# Patient Record
Sex: Female | Born: 1965 | Race: White | Hispanic: No | Marital: Married | Smoking: Never smoker
Health system: Southern US, Community
[De-identification: ages and names within clinical notes are randomized; demographics above are authoritative.]

## PROBLEM LIST (undated history)

## (undated) DIAGNOSIS — R0989 Other specified symptoms and signs involving the circulatory and respiratory systems: Secondary | ICD-10-CM

## (undated) DIAGNOSIS — T8859XA Other complications of anesthesia, initial encounter: Secondary | ICD-10-CM

## (undated) DIAGNOSIS — M719 Bursopathy, unspecified: Secondary | ICD-10-CM

## (undated) DIAGNOSIS — E039 Hypothyroidism, unspecified: Secondary | ICD-10-CM

## (undated) DIAGNOSIS — R6889 Other general symptoms and signs: Secondary | ICD-10-CM

## (undated) DIAGNOSIS — K219 Gastro-esophageal reflux disease without esophagitis: Secondary | ICD-10-CM

## (undated) DIAGNOSIS — I3139 Other pericardial effusion (noninflammatory): Secondary | ICD-10-CM

## (undated) DIAGNOSIS — I313 Pericardial effusion (noninflammatory): Secondary | ICD-10-CM

## (undated) DIAGNOSIS — Z9889 Other specified postprocedural states: Secondary | ICD-10-CM

## (undated) DIAGNOSIS — Z973 Presence of spectacles and contact lenses: Secondary | ICD-10-CM

## (undated) DIAGNOSIS — N9489 Other specified conditions associated with female genital organs and menstrual cycle: Secondary | ICD-10-CM

## (undated) DIAGNOSIS — K631 Perforation of intestine (nontraumatic): Secondary | ICD-10-CM

## (undated) DIAGNOSIS — Z8709 Personal history of other diseases of the respiratory system: Secondary | ICD-10-CM

## (undated) DIAGNOSIS — N390 Urinary tract infection, site not specified: Secondary | ICD-10-CM

## (undated) DIAGNOSIS — R030 Elevated blood-pressure reading, without diagnosis of hypertension: Secondary | ICD-10-CM

## (undated) DIAGNOSIS — R112 Nausea with vomiting, unspecified: Secondary | ICD-10-CM

## (undated) DIAGNOSIS — E063 Autoimmune thyroiditis: Secondary | ICD-10-CM

## (undated) HISTORY — PX: GROIN DEBRIDEMENT: SHX5159

## (undated) HISTORY — DX: Other specified symptoms and signs involving the circulatory and respiratory systems: R09.89

## (undated) HISTORY — DX: Gastro-esophageal reflux disease without esophagitis: K21.9

## (undated) HISTORY — DX: Other general symptoms and signs: R68.89

## (undated) HISTORY — DX: Urinary tract infection, site not specified: N39.0

## (undated) HISTORY — DX: Hypothyroidism, unspecified: E03.9

## (undated) HISTORY — DX: Elevated blood-pressure reading, without diagnosis of hypertension: R03.0

## (undated) HISTORY — DX: Personal history of other diseases of the respiratory system: Z87.09

---

## 1997-04-13 HISTORY — PX: GROIN DEBRIDEMENT: SHX5159

## 1997-04-13 HISTORY — PX: CYST EXCISION: SHX5701

## 1998-01-08 ENCOUNTER — Ambulatory Visit (HOSPITAL_BASED_OUTPATIENT_CLINIC_OR_DEPARTMENT_OTHER): Admission: RE | Admit: 1998-01-08 | Discharge: 1998-01-08 | Payer: Self-pay | Admitting: General Surgery

## 2004-01-09 ENCOUNTER — Ambulatory Visit (HOSPITAL_COMMUNITY): Admission: RE | Admit: 2004-01-09 | Discharge: 2004-01-09 | Payer: Self-pay | Admitting: Family Medicine

## 2004-12-06 ENCOUNTER — Inpatient Hospital Stay (HOSPITAL_COMMUNITY): Admission: RE | Admit: 2004-12-06 | Discharge: 2004-12-08 | Payer: Self-pay | Admitting: Psychiatry

## 2004-12-06 ENCOUNTER — Ambulatory Visit: Payer: Self-pay | Admitting: Psychiatry

## 2005-05-13 ENCOUNTER — Other Ambulatory Visit: Admission: RE | Admit: 2005-05-13 | Discharge: 2005-05-13 | Payer: Self-pay | Admitting: Obstetrics and Gynecology

## 2006-07-29 ENCOUNTER — Ambulatory Visit (HOSPITAL_COMMUNITY): Admission: RE | Admit: 2006-07-29 | Discharge: 2006-07-29 | Payer: Self-pay | Admitting: Obstetrics and Gynecology

## 2006-08-04 ENCOUNTER — Encounter: Admission: RE | Admit: 2006-08-04 | Discharge: 2006-08-04 | Payer: Self-pay | Admitting: Obstetrics and Gynecology

## 2007-03-01 ENCOUNTER — Encounter: Admission: RE | Admit: 2007-03-01 | Discharge: 2007-03-01 | Payer: Self-pay | Admitting: Endocrinology

## 2007-09-01 ENCOUNTER — Ambulatory Visit (HOSPITAL_COMMUNITY): Admission: RE | Admit: 2007-09-01 | Discharge: 2007-09-01 | Payer: Self-pay | Admitting: Family Medicine

## 2010-05-04 ENCOUNTER — Encounter: Payer: Self-pay | Admitting: Obstetrics and Gynecology

## 2010-05-04 ENCOUNTER — Encounter: Payer: Self-pay | Admitting: Family Medicine

## 2010-08-29 NOTE — Discharge Summary (Signed)
NAMELINDA, Forbes                 ACCOUNT NO.:  [id number]   MEDICAL RECORD NO.:  [id number]          PATIENT TYPE:  IPS   LOCATION:  0503                          FACILITY:  [facility]   PHYSICIAN:  Anselm Jungling, MD  DATE OF BIRTH:  1966-03-24   DATE OF ADMISSION:  12/06/2004  DATE OF DISCHARGE:  12/08/2004                                 DISCHARGE SUMMARY   IDENTIFYING DATA AND REASON FOR ADMISSION:  This was the first [facility] admission  for Melinda Forbes, a 46 year old married woman who was admitted due to crisis,  depression that appeared to be related to various stressors and losses.  She  had no prior history of depression or psychiatric treatment.  Please refer  to the admission note for further details pertaining to the symptoms,  circumstances and history that lead to her hospitalization at [facility].   MEDICAL AND LABORATORY:  There were no significant medical issues during  this brief inpatient psychiatric stay.  Admission chemistry panel and CBC  were within normal limits.  A toxicology screen was negative.  Urinalysis  was within normal limits.   HOSPITAL COURSE:  The patient was admitted on December 06, 2004, very late in  the day and was discharged in less than 48 hours.  She was admitted to the  adult inpatient service.  In her first meeting with the undersigned she  indicated that being in the inpatient service had been good for her sake, in  terms of resting.  She denied suicidal ideation of any kind whatsoever.  She recounted to me in detail the various stresses and losses that had lead  to her current crisis.  She indicated that she felt that she was well enough  and stable enough for discharge on that day.  She wished to continue in  outpatient treatment with her usual therapist, Clifton Custard, whom had visited her  while on the unit.  She indicated she was not interested in our intensive  outpatient program.  Also, the patient indicated that she was not ready to  agree to a trial of  antidepressant medication.   The undersigned also met with the patient and her husband briefly.  Prior to  discharge, the patient had an opportunity for a family meeting with herself,  her husband and the unit counselor.  This was a productive meeting.  Her  husband expressed no particular safety concerns following the patient's  discharge.   AFTERCARE:  The patient was to follow up with Dr. Karmen Bongo on December 09, 2004 at 3:00 p.m.  She was to continue on her usual Synthroid 88 mcg  daily which she takes for hypothyroidism, and Ambien 10 mg p.r.n. at h.s. as  needed for insomnia.   DISCHARGE DIAGNOSES:  AXIS I:  Adjustment disorder with depressed mood.  AXIS II:  Deferred.  AXIS III:  History of hypothyroidism.  AXIS IV:  Stressors severe.  AXIS V:  Global assessment of function on discharge 70.           ______________________________  Anselm Jungling, MD  Electronically Signed  SPB/MEDQ  D:  12/30/2004  T:  12/31/2004  Job:  [id number]

## 2010-08-29 NOTE — H&P (Signed)
Melinda Forbes, Melinda Forbes NO.:  [id number]   MEDICAL RECORD NO.:  [id number]          PATIENT TYPE:  IPS   LOCATION:  0503                          FACILITY:  [facility]   PHYSICIAN:  Jeanice Lim, M.D. DATE OF BIRTH:  1965/07/11   DATE OF ADMISSION:  12/06/2004  DATE OF DISCHARGE:                         PSYCHIATRIC ADMISSION ASSESSMENT   IDENTIFYING INFORMATION:  This is a 45 year old married white female.  She  apparently presented here for admission last evening, basically because of  overwhelming stress.  She stated I just feel overwhelmed with fear, anxiety  and grief.  Her father died a week ago.  They buried him a week ago  yesterday, Saturday.  Her husband resigned from [employer] where he was a  Education officer, environmental.  He went into [facility] due to his addiction to pain  medications.  He is now in outpatient treatment there.  He blames her for  his substance abuse.  He wants the patient out of the house.  There has been  ongoing conflict with her husband over his substance abuse.  The patient  states the only man who really loved me is gone, meaning this was her  father.  She states I don't want to live.  I just want to go to sleep and  never wake up.  She says I do not feel like I would do anything to hurt  myself but I would not stop anyone who was going to do it for me.  She  states that she did not realize how bad she would feel when her father  actually died.  Apparently he had a very rare bone marrow illness.  It was a  scarring illness of the bone marrow, so although his death was expected it  still hit her very, very hard.   PAST PSYCHIATRIC HISTORY:  She has no inpatient history.  She has been going  to outpatient family counseling, Karmen Bongo, for several years now.   SOCIAL HISTORY:  She has a B.S.  She is employed as an Airline pilot.  She and  her husband have 3 children, a son who is 1, and two daughters 58, and 85.  They have been married 21 years.   The oldest daughter just left a week ago  for college.   FAMILY HISTORY:  Her biological mother is bipolar.   ALCOHOL AND DRUG ABUSE:  None.   PAST MEDICAL HISTORY:  Primary care Jahquez Steffler is Dr. Merri Brunette.  Medical  problems:  She is hypothyroid and treated.  Medications:  Synthroid 0.075 mg  q.o. day and the other days 0.088.   ALLERGIES:  No known drug allergies.   POSITIVE PHYSICAL FINDINGS:  PHYSICAL EXAMINATION:  She is somewhat thin,  otherwise her physical examination is within normal limits.  Her vital signs  on admission:  Height is 63-3/4 inches.  She weighs 101 pounds.  Her  temperature is 98.6, blood pressure is 110/71 to 123/71.  Pulse range is 71  to 82, and her respirations are 16.  Her labs are not back yet.   MENTAL STATUS  EXAM:  She is alert and oriented x3.  She is appropriately  groomed, dressed, and appears adequately nourished albeit thin.  Her speech  has a normal rate, rhythm and tone.  Her mood is labile.  She cries quite  easily.  Her affect reflects pain and grief.  Thought process is clear,  rational and goal oriented.  She states she just can no longer stay in this  situation at home.  Cognitively, judgment and insight are good,  concentration and memory are good.  Her intelligence is at least average.  She denies suicidal or homicidal ideation.  She denies auditory or visual  hallucinations.   ADMISSION DIAGNOSES:  AXIS I:  Major depressive disorder, single, severe, no  psychosis.  AXIS II:  Deferred.  AXIS III:  Hypothyroidism.  AXIS IV:  Severe, marital and grief, problems with primary support group,  economic issues, and other psychosocial problems, her husband's addiction.   PLAN:  The plan is to admit for safety and stabilization, to start an  antidepressant, anti-anxiety agent.  The patient declines beginning that at  this time, and we can assess her for the IOP.  We will set up a family  session with her husband today to make sure  returning home is a safe option.      Mickie Leonarda Salon, P.A.-C.      Jeanice Lim, M.D.  Electronically Signed    MD/MEDQ  D:  12/07/2004  T:  12/07/2004  Job:  [id number]

## 2010-09-16 ENCOUNTER — Other Ambulatory Visit (HOSPITAL_COMMUNITY): Payer: Self-pay | Admitting: Family Medicine

## 2010-09-16 DIAGNOSIS — Z1231 Encounter for screening mammogram for malignant neoplasm of breast: Secondary | ICD-10-CM

## 2010-10-02 ENCOUNTER — Ambulatory Visit (HOSPITAL_COMMUNITY)
Admission: RE | Admit: 2010-10-02 | Discharge: 2010-10-02 | Disposition: A | Discharge status: Home / Self Care | Payer: BC Managed Care – PPO | Source: Ambulatory Visit | Attending: Family Medicine | Admitting: Family Medicine

## 2010-10-02 DIAGNOSIS — Z1231 Encounter for screening mammogram for malignant neoplasm of breast: Secondary | ICD-10-CM

## 2010-11-19 ENCOUNTER — Other Ambulatory Visit: Payer: Self-pay | Admitting: Family Medicine

## 2010-11-19 ENCOUNTER — Other Ambulatory Visit (HOSPITAL_COMMUNITY)
Admission: RE | Admit: 2010-11-19 | Discharge: 2010-11-19 | Disposition: A | Discharge status: Home / Self Care | Payer: BC Managed Care – PPO | Source: Ambulatory Visit | Attending: Family Medicine | Admitting: Family Medicine

## 2010-11-19 DIAGNOSIS — Z0000 Encounter for general adult medical examination without abnormal findings: Secondary | ICD-10-CM | POA: Insufficient documentation

## 2011-12-22 ENCOUNTER — Other Ambulatory Visit (HOSPITAL_COMMUNITY): Payer: Self-pay | Admitting: Family Medicine

## 2011-12-22 DIAGNOSIS — Z1231 Encounter for screening mammogram for malignant neoplasm of breast: Secondary | ICD-10-CM

## 2011-12-30 ENCOUNTER — Ambulatory Visit (HOSPITAL_COMMUNITY)
Admission: RE | Admit: 2011-12-30 | Discharge: 2011-12-30 | Disposition: A | Discharge status: Home / Self Care | Payer: BC Managed Care – PPO | Source: Ambulatory Visit | Attending: Family Medicine | Admitting: Family Medicine

## 2011-12-30 DIAGNOSIS — Z1231 Encounter for screening mammogram for malignant neoplasm of breast: Secondary | ICD-10-CM | POA: Insufficient documentation

## 2012-01-06 ENCOUNTER — Ambulatory Visit (INDEPENDENT_AMBULATORY_CARE_PROVIDER_SITE_OTHER): Payer: BC Managed Care – PPO | Admitting: Family Medicine

## 2012-01-06 ENCOUNTER — Encounter: Payer: Self-pay | Admitting: Family Medicine

## 2012-01-06 VITALS — BP 132/82 | HR 65 | Temp 98.8°F | Ht 63.5 in | Wt 112.0 lb

## 2012-01-06 DIAGNOSIS — E039 Hypothyroidism, unspecified: Secondary | ICD-10-CM | POA: Insufficient documentation

## 2012-01-06 DIAGNOSIS — K219 Gastro-esophageal reflux disease without esophagitis: Secondary | ICD-10-CM

## 2012-01-06 DIAGNOSIS — Z87440 Personal history of urinary (tract) infections: Secondary | ICD-10-CM

## 2012-01-06 NOTE — Assessment & Plan Note (Signed)
Problem stable.  Continue current medications and diet appropriate for this condition.  We have reviewed our general long term plan for this problem and also reviewed symptoms and signs that should prompt the patient to call or return to the office.  

## 2012-01-06 NOTE — Assessment & Plan Note (Signed)
Managed by endocrinologist (Dr. Talmage Nap).

## 2012-01-06 NOTE — Progress Notes (Signed)
Office Note 01/06/2012  CC:  Chief Complaint  Patient presents with  . Establish Care    chronic UTI, no symptoms today; has appt with urology set up    HPI:  Melinda Forbes is a 46 y.o. White female who is here to establish care. Patient's most recent primary MD: Deboraha Sprang at [facility] (Dr. Katrinka Blazing). Old records were not reviewed prior to or during today's visit.  Latest problem is recurrent bladder infections.  Just finished abx.  Currently asymptomatic. LMP 01/2007.  Mammogram last week-normal.  Repeat in 1 yr..  Last pap was 11/2010--normal.  Has never had an abnormal pap smear.   Past Medical History  Diagnosis Date  . Hypothyroidism     managed by endocrinologist (Dr. Talmage Nap)  . GERD (gastroesophageal reflux disease)     omeprazole daily helps  . Recurrent UTI     no hx of pyelonephritis    Past Surgical History  Procedure Date  . Cesarean section 1988    first pregnancy.  Her second delivery was vaginal.    Family History  Problem Relation Age of Onset  . Diabetes Mother   . Heart disease Father     first MI age 39  . Hypertension Father   . Cancer Maternal Grandmother     died of breast cancer age 60    History   Social History  . Marital Status: Divorced    Spouse Name: N/A    Number of Children: N/A  . Years of Education: N/A   Occupational History  . Not on file.   Social History Main Topics  . Smoking status: Never Smoker   . Smokeless tobacco: Never Used  . Alcohol Use: No  . Drug Use: No  . Sexually Active: Not on file   Other Topics Concern  . Not on file   Social History Narrative   Divorced, engaged.  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  Bachelor's degree from [place] [employer].  She is originally from [place].No T/A/Ds.Exercise: walks a lot (1-2 times per week).She tries to eat healthy.    Outpatient Encounter Prescriptions as of 01/06/2012  Medication Sig Dispense Refill  . omeprazole (PRILOSEC) 20 MG capsule Take 1  capsule by mouth daily.      Marland Kitchen SYNTHROID 88 MCG tablet Take 1 tablet by mouth daily.        Allergies  Allergen Reactions  . Other     NARCOTICS--PROJECTILE VOMITING    ROS Review of Systems  Constitutional: Negative for fever and fatigue.  HENT: Negative for congestion and sore throat.   Eyes: Negative for visual disturbance.  Respiratory: Negative for cough.   Cardiovascular: Negative for chest pain.  Gastrointestinal: Negative for nausea and abdominal pain.  Genitourinary: Negative for dysuria, frequency, hematuria, flank pain, vaginal bleeding and vaginal discharge.  Musculoskeletal: Negative for back pain and joint swelling.  Skin: Negative for rash.  Neurological: Negative for weakness and headaches.  Hematological: Negative for adenopathy.    PE; Blood pressure 132/82, pulse 65, temperature 98.8 F (37.1 C), temperature source Temporal, height 5' 3.5" (1.613 m), weight 112 lb (50.803 kg), SpO2 98.00%. Gen: Alert, well appearing.  Patient is oriented to person, place, time, and situation. ENT:  Eyes: no injection, icteris, swelling, or exudate.  EOMI, PERRLA. Nose: no drainage or turbinate edema/swelling.  No injection or focal lesion.  Mouth: lips without lesion/swelling.  Oral mucosa pink and moist.  Dentition intact and without obvious caries or gingival swelling.  Oropharynx without erythema, exudate, or swelling.  Neck - No masses or thyromegaly or limitation in range of motion CV: RRR, no m/r/g.   LUNGS: CTA bilat, nonlabored resps, good aeration in all lung fields. EXT: no clubbing, cyanosis, or edema.   Pertinent labs:  none  ASSESSMENT AND PLAN:   New pt: obtain old records.  History of recurrent UTIs Currently asymptomatic. She has initial consult with urologist, Dr. Brunilda Payor, on 03/03/12.  Hypothyroidism Managed by endocrinologist (Dr. Talmage Nap).  GERD (gastroesophageal reflux disease) Problem stable.  Continue current medications and diet appropriate for  this condition.  We have reviewed our general long term plan for this problem and also reviewed symptoms and signs that should prompt the patient to call or return to the office.   She'll make appt for CPE/pelvic at her convenience since her last one was 11/2010. Otherwise, return prn.

## 2012-01-06 NOTE — Assessment & Plan Note (Signed)
Currently asymptomatic. She has initial consult with urologist, Dr. Brunilda Payor, on 03/03/12.

## 2012-03-11 ENCOUNTER — Encounter: Payer: Self-pay | Admitting: Family Medicine

## 2012-06-17 ENCOUNTER — Ambulatory Visit: Payer: BC Managed Care – PPO | Admitting: Family Medicine

## 2012-06-24 ENCOUNTER — Ambulatory Visit (INDEPENDENT_AMBULATORY_CARE_PROVIDER_SITE_OTHER): Payer: BC Managed Care – PPO | Admitting: Family Medicine

## 2012-06-24 ENCOUNTER — Encounter: Payer: Self-pay | Admitting: Family Medicine

## 2012-06-24 VITALS — BP 106/64 | HR 56 | Temp 99.0°F | Ht 63.5 in | Wt 116.0 lb

## 2012-06-24 DIAGNOSIS — K219 Gastro-esophageal reflux disease without esophagitis: Secondary | ICD-10-CM

## 2012-06-24 MED ORDER — ESOMEPRAZOLE MAGNESIUM 40 MG PO CPDR: 40.0000 mg | DELAYED_RELEASE_CAPSULE | Freq: Every day | ORAL | Status: DC

## 2012-06-24 MED ORDER — RANITIDINE HCL 300 MG PO TABS: 300.0000 mg | ORAL_TABLET | Freq: Every day | ORAL | Status: DC

## 2012-06-24 NOTE — Progress Notes (Signed)
OFFICE NOTE  06/24/2012  CC:  Chief Complaint  Patient presents with  . tonsil issue    intermittent swelling, soreness     HPI: Patient is a 47 y.o. Caucasian female who is here for questions about her throat.  She recounts years of symmetric "tonsillar" swelling, white stuff collects in "tonsils", throat hurts most of the time.  She feels some PND and admits to some GERD sx's.  GER better since on omeprazole but still problematic.   She has not elevated the head of her bed.  She had a nasal spray at one time but didn't like it. Denies nasal congestion.  No fevers.  No URI sx's or cough.   She does clear her throat a lot.  Eating makes her have more mucous in throat and makes her feel like she has to cough up stuff, but she denies dysphagia or odynophagia.   She had food allergy testing in the past year or so and these were all negative per her report. See PMH for past ENT info.  ROS: as above, plus no abd pain, no ear aches, no rashes, no SOB or DOE, no voice hoarseness.  Pertinent PMH:  Past Medical History  Diagnosis Date  . Hypothyroidism     managed by endocrinologist (Dr. Talmage Nap)  . GERD (gastroesophageal reflux disease)     omeprazole daily helps  . Recurrent UTI     no hx of pyelonephritis  . Chronic throat clearing     ENT eval 2008 by Dr. Ezzard Standing showed normal vocal cords and " slight edema of the arytenoid mucosa possibly indicative of globus type sx's and possible reflux symptoms".   Past Surgical History  Procedure Laterality Date  . Cesarean section  1988    first pregnancy.  Her second delivery was vaginal.  . Groin debridement    . Cyst excision  1999    Epidermal inclusion cyst in right groin (Dr. Johna Sheriff)    MEDS: **Of note, she takes the omprazole 20mg  bid, not qd. Outpatient Prescriptions Prior to Visit  Medication Sig Dispense Refill  . omeprazole (PRILOSEC) 20 MG capsule Take 1 capsule by mouth daily.      Marland Kitchen SYNTHROID 88 MCG tablet Take 1 tablet by  mouth daily.       No facility-administered medications prior to visit.  Cranberry pill daily to prevent bladder infections alleve occasionally  PE: Blood pressure 106/64, pulse 56, temperature 99 F (37.2 C), temperature source Temporal, height 5' 3.5" (1.613 m), weight 116 lb (52.617 kg). Gen: Alert, well appearing.  Patient is oriented to person, place, time, and situation. ENT: Ears: EACs clear, normal epithelium.  TMs with good light reflex and landmarks bilaterally.  Eyes: no injection, icteris, swelling, or exudate.  EOMI, PERRLA. Nose: no drainage or turbinate edema/swelling.  No injection or focal lesion.  Mouth: lips without lesion/swelling.  Oral mucosa pink and moist.  Dentition intact and without obvious caries or gingival swelling.   Oropharynx exam shows no visible tonsillar tissue on the right, minimal visible tonsillar tissue on the left.  No erythema of the tonsillar pillars or post pharyng wall.  No PND noted.  She has mild erythema of the soft palate.  No exudate, no swelling.   Neck - No masses or thyromegaly or limitation in range of motion CV: RRR, no m/r/g.   LUNGS: CTA bilat, nonlabored resps, good aeration in all lung fields.   IMPRESSION AND PLAN:  GERD (gastroesophageal reflux disease) I think this is  the root of all of her current symptoms. I will change her to nexium 40mg  qAM and will add 300 mg zantac at bedtime. Recommended she elevate the head of her bed with a 2 X 4 or brick, continue to try to follow a restrictive diet regarding GER. F/u 1 mo and if no better then will refer back to her ENT for further eval/mgmt--she may need esoph manometry/pH probe and may potentially be a candidate for Nissen fundoplication.   An After Visit Summary was printed and given to the patient.  FOLLOW UP: 1 mo

## 2012-06-24 NOTE — Assessment & Plan Note (Addendum)
I think this is the root of all of her current symptoms. I will change her to nexium 40mg  qAM and will add 300 mg zantac at bedtime. Recommended she elevate the head of her bed with a 2 X 4 or brick, continue to try to follow a restrictive diet regarding GER. F/u 1 mo and if no better then will refer back to her ENT for further eval/mgmt--she may need esoph manometry/pH probe and may potentially be a candidate for Nissen fundoplication.

## 2012-07-22 ENCOUNTER — Encounter: Payer: Self-pay | Admitting: Family Medicine

## 2012-07-22 ENCOUNTER — Ambulatory Visit (INDEPENDENT_AMBULATORY_CARE_PROVIDER_SITE_OTHER): Payer: BC Managed Care – PPO | Admitting: Family Medicine

## 2012-07-22 VITALS — BP 92/60 | HR 59 | Temp 98.8°F | Ht 63.5 in | Wt 117.5 lb

## 2012-07-22 DIAGNOSIS — K219 Gastro-esophageal reflux disease without esophagitis: Secondary | ICD-10-CM

## 2012-07-22 NOTE — Assessment & Plan Note (Signed)
Moderately well controlled with prilosec 20mg  bid. She still needs to try adding the zantac 300 mg qhs like I recommended last visit. I encouraged her to have f/u visit with her ENT for possible consideration of re-do laryngoscopy and/or 24h pH probe testing.

## 2012-07-22 NOTE — Progress Notes (Signed)
OFFICE NOTE  07/22/2012  CC:  Chief Complaint  Patient presents with  . Follow-up    Genella Rife     HPI: Patient is a 47 y.o. Caucasian female who is here for 1 mo f/u GERD.   Nexium x 10d was of no help so she switched back to prilosec bid.  She has not done the zantac at night b/c she wasn't sure if it was ok to mix with the prilosec.  Pertinent PMH:  Past Medical History  Diagnosis Date  . Hypothyroidism     managed by endocrinologist (Dr. Talmage Nap)  . GERD (gastroesophageal reflux disease)     omeprazole daily helps  . Recurrent UTI     no hx of pyelonephritis  . Chronic throat clearing     ENT eval 2008 by Dr. Ezzard Standing showed normal vocal cords and " slight edema of the arytenoid mucosa possibly indicative of globus type sx's and possible reflux symptoms".    MEDS:  Outpatient Prescriptions Prior to Visit  Medication Sig Dispense Refill  . SYNTHROID 88 MCG tablet Take 1 tablet by mouth daily.      Marland Kitchen esomeprazole (NEXIUM) 40 MG capsule Take 1 capsule (40 mg total) by mouth daily before breakfast.  30 capsule  6  . ranitidine (ZANTAC) 300 MG tablet Take 1 tablet (300 mg total) by mouth at bedtime.  30 tablet  6   No facility-administered medications prior to visit.    PE: Blood pressure 92/60, pulse 59, temperature 98.8 F (37.1 C), temperature source Oral, height 5' 3.5" (1.613 m), weight 117 lb 8 oz (53.298 kg), SpO2 98.00%. Gen: Alert, well appearing.  Patient is oriented to person, place, time, and situation. AFFECT: pleasant.  Displays lucid thought and speech. No further exam today.  IMPRESSION AND PLAN:  GERD (gastroesophageal reflux disease) Moderately well controlled with prilosec 20mg  bid. She still needs to try adding the zantac 300 mg qhs like I recommended last visit. I encouraged her to have f/u visit with her ENT for possible consideration of re-do laryngoscopy and/or 24h pH probe testing.    An After Visit Summary was printed and given to the  patient.   FOLLOW UP: prn

## 2012-09-21 ENCOUNTER — Other Ambulatory Visit: Payer: Self-pay | Admitting: *Deleted

## 2012-09-21 MED ORDER — OMEPRAZOLE 20 MG PO CPDR: 20.0000 mg | DELAYED_RELEASE_CAPSULE | Freq: Two times a day (BID) | ORAL | Status: DC

## 2012-09-21 NOTE — Telephone Encounter (Signed)
Allergist test showed no allergies, but placed on Omeprazole for acid reflux; needs refill to [facility] request to pharmacy.

## 2012-10-24 ENCOUNTER — Telehealth: Payer: Self-pay | Admitting: Emergency Medicine

## 2012-10-24 DIAGNOSIS — R6889 Other general symptoms and signs: Secondary | ICD-10-CM

## 2012-10-24 NOTE — Telephone Encounter (Signed)
Patient states she is still having issues since her 06-24-12 visit and would like a referral to [facility]. Please advise patient as she really does not want to make another appointment here since nothing was found.

## 2012-10-24 NOTE — Telephone Encounter (Signed)
Referral to ENT done per Dr. Milinda Cave.

## 2012-12-15 ENCOUNTER — Other Ambulatory Visit: Payer: Self-pay | Admitting: Family Medicine

## 2012-12-15 DIAGNOSIS — Z1231 Encounter for screening mammogram for malignant neoplasm of breast: Secondary | ICD-10-CM

## 2013-01-11 ENCOUNTER — Other Ambulatory Visit: Payer: Self-pay | Admitting: Family Medicine

## 2013-01-11 MED ORDER — OMEPRAZOLE 20 MG PO CPDR: 20.0000 mg | DELAYED_RELEASE_CAPSULE | Freq: Two times a day (BID) | ORAL | Status: DC

## 2013-01-20 ENCOUNTER — Ambulatory Visit (HOSPITAL_COMMUNITY)
Admission: RE | Admit: 2013-01-20 | Discharge: 2013-01-20 | Disposition: A | Discharge status: Home / Self Care | Payer: BC Managed Care – PPO | Source: Ambulatory Visit | Attending: Family Medicine | Admitting: Family Medicine

## 2013-01-20 DIAGNOSIS — Z1231 Encounter for screening mammogram for malignant neoplasm of breast: Secondary | ICD-10-CM

## 2013-02-13 ENCOUNTER — Other Ambulatory Visit: Payer: Self-pay | Admitting: Family Medicine

## 2013-02-13 MED ORDER — OMEPRAZOLE 20 MG PO CPDR: 20.0000 mg | DELAYED_RELEASE_CAPSULE | Freq: Two times a day (BID) | ORAL | Status: DC

## 2013-04-14 ENCOUNTER — Telehealth: Payer: Self-pay | Admitting: Family Medicine

## 2013-04-14 MED ORDER — CIPROFLOXACIN HCL 500 MG PO TABS: 500.0000 mg | ORAL_TABLET | Freq: Two times a day (BID) | ORAL | Status: DC

## 2013-04-14 NOTE — Telephone Encounter (Signed)
Please advise 

## 2013-04-14 NOTE — Telephone Encounter (Signed)
Cipro 500mg  sent to pharmacy per Dr. Milinda CaveMcGowen.

## 2013-04-14 NOTE — Addendum Note (Signed)
Addended by: Eulah PontALBRIGHT, Yvonda Fouty M on: 04/14/2013 04:34 PM   Modules accepted: Orders

## 2013-04-14 NOTE — Telephone Encounter (Signed)
Patient is having burning, pain, and frequency along with blood in her urine. She is not near an urgent care. She can [facility] an Rx [facility] on [address] It's at the corner [address]

## 2013-04-14 NOTE — Telephone Encounter (Signed)
Patient advised to go to an urgent care because we would not be able to send in antibiotics without seeing patient in office.  Patient was not very happy about this but I tried to explain that an urgent care would be her best bet because they would be able to test her urine and make sure she was on the correct antibiotic but then the phone was disconnected or she hung up, unsure of which happened.

## 2013-04-14 NOTE — Telephone Encounter (Signed)
Patient is out of town on a ski trip and she is experiencing burning with urination and frequency, she wants to see if something [facility]

## 2013-04-18 ENCOUNTER — Ambulatory Visit: Payer: BC Managed Care – PPO | Admitting: Family Medicine

## 2013-07-03 ENCOUNTER — Other Ambulatory Visit: Payer: Self-pay | Admitting: Family Medicine

## 2013-07-20 ENCOUNTER — Other Ambulatory Visit: Payer: Self-pay | Admitting: Family Medicine

## 2013-07-20 ENCOUNTER — Other Ambulatory Visit (HOSPITAL_COMMUNITY)
Admission: RE | Admit: 2013-07-20 | Discharge: 2013-07-20 | Disposition: A | Discharge status: Home / Self Care | Payer: BC Managed Care – PPO | Source: Ambulatory Visit | Attending: Family Medicine | Admitting: Family Medicine

## 2013-07-20 DIAGNOSIS — Z124 Encounter for screening for malignant neoplasm of cervix: Secondary | ICD-10-CM | POA: Insufficient documentation

## 2013-07-21 ENCOUNTER — Other Ambulatory Visit: Payer: Self-pay | Admitting: Family Medicine

## 2013-08-04 ENCOUNTER — Other Ambulatory Visit: Payer: Self-pay | Admitting: Family Medicine

## 2013-10-09 ENCOUNTER — Other Ambulatory Visit: Payer: Self-pay | Admitting: Family Medicine

## 2013-10-30 ENCOUNTER — Other Ambulatory Visit: Payer: Self-pay | Admitting: Family Medicine

## 2014-01-09 ENCOUNTER — Other Ambulatory Visit (HOSPITAL_COMMUNITY): Payer: Self-pay | Admitting: Family Medicine

## 2014-01-09 DIAGNOSIS — Z1231 Encounter for screening mammogram for malignant neoplasm of breast: Secondary | ICD-10-CM

## 2014-01-26 ENCOUNTER — Ambulatory Visit (HOSPITAL_COMMUNITY): Payer: BC Managed Care – PPO

## 2014-02-09 ENCOUNTER — Ambulatory Visit (HOSPITAL_COMMUNITY)
Admission: RE | Admit: 2014-02-09 | Discharge: 2014-02-09 | Disposition: A | Discharge status: Home / Self Care | Payer: BC Managed Care – PPO | Source: Ambulatory Visit | Attending: Family Medicine | Admitting: Family Medicine

## 2014-02-09 DIAGNOSIS — Z1231 Encounter for screening mammogram for malignant neoplasm of breast: Secondary | ICD-10-CM | POA: Insufficient documentation

## 2014-11-02 ENCOUNTER — Other Ambulatory Visit: Payer: Self-pay | Admitting: Orthopaedic Surgery

## 2014-11-02 DIAGNOSIS — M25552 Pain in left hip: Secondary | ICD-10-CM

## 2014-11-15 ENCOUNTER — Other Ambulatory Visit: Payer: Self-pay

## 2015-01-14 ENCOUNTER — Other Ambulatory Visit: Payer: Self-pay

## 2015-01-14 DIAGNOSIS — Z1231 Encounter for screening mammogram for malignant neoplasm of breast: Secondary | ICD-10-CM

## 2015-02-13 ENCOUNTER — Ambulatory Visit
Admission: RE | Admit: 2015-02-13 | Discharge: 2015-02-13 | Disposition: A | Discharge status: Home / Self Care | Payer: Managed Care, Other (non HMO) | Source: Ambulatory Visit

## 2015-02-13 DIAGNOSIS — Z1231 Encounter for screening mammogram for malignant neoplasm of breast: Secondary | ICD-10-CM

## 2016-02-17 ENCOUNTER — Other Ambulatory Visit: Payer: Self-pay | Admitting: Family Medicine

## 2016-02-17 DIAGNOSIS — Z1231 Encounter for screening mammogram for malignant neoplasm of breast: Secondary | ICD-10-CM

## 2016-03-13 ENCOUNTER — Ambulatory Visit
Admission: RE | Admit: 2016-03-13 | Discharge: 2016-03-13 | Disposition: A | Discharge status: Home / Self Care | Payer: Managed Care, Other (non HMO) | Source: Ambulatory Visit | Attending: Family Medicine | Admitting: Family Medicine

## 2016-03-13 DIAGNOSIS — Z1231 Encounter for screening mammogram for malignant neoplasm of breast: Secondary | ICD-10-CM

## 2016-03-20 ENCOUNTER — Ambulatory Visit: Payer: Managed Care, Other (non HMO)

## 2016-07-16 ENCOUNTER — Other Ambulatory Visit (HOSPITAL_COMMUNITY)
Admission: RE | Admit: 2016-07-16 | Discharge: 2016-07-16 | Disposition: A | Discharge status: Home / Self Care | Payer: Managed Care, Other (non HMO) | Source: Ambulatory Visit | Attending: Physician Assistant | Admitting: Physician Assistant

## 2016-07-16 ENCOUNTER — Other Ambulatory Visit: Payer: Self-pay | Admitting: Physician Assistant

## 2016-07-16 DIAGNOSIS — Z1151 Encounter for screening for human papillomavirus (HPV): Secondary | ICD-10-CM | POA: Diagnosis not present

## 2016-07-16 DIAGNOSIS — Z01419 Encounter for gynecological examination (general) (routine) without abnormal findings: Secondary | ICD-10-CM | POA: Diagnosis present

## 2016-07-21 LAB — CYTOLOGY - PAP
Diagnosis: NEGATIVE
HPV: NOT DETECTED

## 2017-02-23 ENCOUNTER — Other Ambulatory Visit: Payer: Self-pay | Admitting: Family Medicine

## 2017-02-23 DIAGNOSIS — Z1231 Encounter for screening mammogram for malignant neoplasm of breast: Secondary | ICD-10-CM

## 2017-03-25 ENCOUNTER — Ambulatory Visit
Admission: RE | Admit: 2017-03-25 | Discharge: 2017-03-25 | Disposition: A | Discharge status: Home / Self Care | Payer: Managed Care, Other (non HMO) | Source: Ambulatory Visit | Attending: Family Medicine | Admitting: Family Medicine

## 2017-03-25 DIAGNOSIS — Z1231 Encounter for screening mammogram for malignant neoplasm of breast: Secondary | ICD-10-CM

## 2018-01-06 ENCOUNTER — Other Ambulatory Visit: Payer: Self-pay | Admitting: Family Medicine

## 2018-01-06 DIAGNOSIS — Z1231 Encounter for screening mammogram for malignant neoplasm of breast: Secondary | ICD-10-CM

## 2018-03-28 ENCOUNTER — Ambulatory Visit
Admission: RE | Admit: 2018-03-28 | Discharge: 2018-03-28 | Disposition: A | Discharge status: Home / Self Care | Payer: Managed Care, Other (non HMO) | Source: Ambulatory Visit | Attending: Family Medicine | Admitting: Family Medicine

## 2018-03-28 DIAGNOSIS — Z1231 Encounter for screening mammogram for malignant neoplasm of breast: Secondary | ICD-10-CM

## 2018-10-27 ENCOUNTER — Other Ambulatory Visit: Payer: Self-pay

## 2018-10-27 ENCOUNTER — Encounter: Payer: Self-pay | Admitting: Orthopaedic Surgery

## 2018-10-27 ENCOUNTER — Ambulatory Visit (INDEPENDENT_AMBULATORY_CARE_PROVIDER_SITE_OTHER): Payer: Managed Care, Other (non HMO) | Admitting: Orthopaedic Surgery

## 2018-10-27 ENCOUNTER — Ambulatory Visit (INDEPENDENT_AMBULATORY_CARE_PROVIDER_SITE_OTHER): Payer: Managed Care, Other (non HMO)

## 2018-10-27 VITALS — BP 121/75 | HR 56 | Ht 63.0 in | Wt 120.0 lb

## 2018-10-27 DIAGNOSIS — M25552 Pain in left hip: Secondary | ICD-10-CM | POA: Insufficient documentation

## 2018-10-27 MED ORDER — DIAZEPAM 5 MG PO TABS: 5.0000 mg | ORAL_TABLET | Freq: Once | ORAL | 0 refills | Status: DC | PRN

## 2018-10-27 NOTE — Progress Notes (Signed)
Office Visit Note   Patient: Melinda Forbes           Date of Birth: 07/10/1965           MRN: [id number] Visit Date: 10/27/2018              Requested by: Merri BrunetteSmith, Candace, MD [phone] [address] [place],  [place] [id number] PCP: Merri BrunetteSmith, Candace, MD   Assessment & Plan: Visit Diagnoses:  1. Pain in left hip     Plan: Pain directly over the greater trochanter of the left hip without any related back or buttock symptoms.  No referred pain, numbness or tingling in the left lower extremity.  Films of her pelvis and 6, specifically, left hip were negative.  Will obtain MRI scan to rule out trochanteric issues, bursitis or even glutei tear and will need Valium for sedation Follow-Up Instructions: Return After MRI scan left hip.   Orders:  Orders Placed This Encounter  Procedures  . XR HIP UNILAT W OR W/O PELVIS 2-3 VIEWS LEFT  . MR Hip Left w/o contrast   Meds ordered this encounter  Medications  . diazepam (VALIUM) 5 MG tablet    Sig: Take 1 tablet (5 mg total) by mouth once as needed for up to 1 dose for anxiety. Take 2 hours prior to MRI    Dispense:  1 tablet    Refill:  0    Order Specific Question:   Supervising Provider    Answer:   Valeria BatmanWHITFIELD, Melissa Tomaselli W [[id number]]      Procedures: No procedures performed   Clinical Data: No additional findings.   Subjective: Chief Complaint  Patient presents with  . Left Hip - Pain  Patient presents today for left hip pain. No known injury. Patient states that it hurts almost constantly. Pivoting on that side causes more pain. She takes Aleve at night. She said that as a child she could dislocate her hip and reduce it on her own. Seen several years ago for similar problem with negative films.  Patient has had a sensation of her "left hip coming out of place" for years.  Has difficulty sleeping on that side.  Absolutely no pain in her lumbar spine or buttock.  No numbness or tingling.  No right hip issues.  No groin pain.  No  anterior thigh pain.  No knee pain.  Has difficulty with certain positions causing localized tenderness over the greater trochanter left hip.  Does not seem to have a problem with straight line activities  HPI  Review of Systems   Objective: Vital Signs: BP 121/75   Pulse (!) 56   Ht 5\' 3"  (1.6 m)   Wt 120 lb (54.4 kg)   BMI 21.26 kg/m   Physical Exam Constitutional:      Appearance: She is well-developed.  Eyes:     Pupils: Pupils are equal, round, and reactive to light.  Pulmonary:     Effort: Pulmonary effort is normal.  Skin:    General: Skin is warm and dry.  Neurological:     Mental Status: She is alert and oriented to person, place, and time.  Psychiatric:        Behavior: Behavior normal.     Ortho Exam awake alert and oriented x3.  Comfortable sitting.  There is some tenderness directly over the greater trochanter of the left hip.  No crepitation or grinding.  No pain or loss of motion in the left groin relative to  the hip.  No thigh pain.  No knee pain.  Straight leg raise negative.  No percussible tenderness of lumbar spine, sacrum or buttock.  Neurologically intact.  Specialty Comments:  No specialty comments available.  Imaging: Xr Hip Unilat W Or W/o Pelvis 2-3 Views Left  Result Date: 10/27/2018 AP pelvis and lateral left hip are obtained.  There is no abnormality about the left hip.  Femoral head is nice and round the joint space is well-maintained and symmetrical to the right.  No ectopic calcification.  Sacroiliac joints appear to be intact.  Some degenerative change at the pubic symphysis.  No obvious abnormality about the greater trochanter where the patient is experiencing pain    PMFS History: Patient Active Problem List   Diagnosis Date Noted  . Pain in left hip 10/27/2018  . History of recurrent UTIs 01/06/2012  . Hypothyroidism   . GERD (gastroesophageal reflux disease)    Past Medical History:  Diagnosis Date  . Chronic throat clearing     ENT eval 2008 by Dr. Lucia Gaskins showed normal vocal cords and " slight edema of the arytenoid mucosa possibly indicative of globus type sx's and possible reflux symptoms".  . GERD (gastroesophageal reflux disease)    omeprazole daily helps  . Hypothyroidism    managed by endocrinologist (Dr. Chalmers Cater)  . Recurrent UTI    no hx of pyelonephritis    Family History  Problem Relation Age of Onset  . Diabetes Mother   . Breast cancer Mother   . Heart disease Father        first MI age 71  . Hypertension Father   . Cancer Father        "cancer of the bone marrow"  . Cancer Maternal Grandmother        died of breast cancer age 43  . Breast cancer Maternal Grandmother     Past Surgical History:  Procedure Laterality Date  . [place]   first pregnancy.  Her second delivery was vaginal.  . CYST EXCISION  1999   Epidermal inclusion cyst in right groin (Dr. Excell Seltzer)  . GROIN DEBRIDEMENT     Social History   Occupational History  . Not on file  Tobacco Use  . Smoking status: Never Smoker  . Smokeless tobacco: Never Used  Substance and Sexual Activity  . Alcohol use: No  . Drug use: No  . Sexual activity: Not on file

## 2018-10-28 ENCOUNTER — Telehealth: Payer: Self-pay | Admitting: Orthopaedic Surgery

## 2018-10-28 NOTE — Telephone Encounter (Signed)
Do you mind faxing this order? Im not sure exactly how to get the order printed.

## 2018-10-28 NOTE — Telephone Encounter (Signed)
Received call from [facility] with [employer] needing order for MRI of left hip faxed over to [place]. The fax# is [phone]   The number to contact Bronson Ing is [phone]

## 2018-10-28 NOTE — Telephone Encounter (Signed)
Faxed

## 2018-11-24 ENCOUNTER — Other Ambulatory Visit: Payer: Self-pay

## 2018-11-24 ENCOUNTER — Encounter: Payer: Self-pay | Admitting: Orthopaedic Surgery

## 2018-11-24 ENCOUNTER — Ambulatory Visit (INDEPENDENT_AMBULATORY_CARE_PROVIDER_SITE_OTHER): Payer: Managed Care, Other (non HMO) | Admitting: Orthopaedic Surgery

## 2018-11-24 VITALS — BP 119/71 | HR 49 | Ht 63.0 in | Wt 120.0 lb

## 2018-11-24 DIAGNOSIS — M25552 Pain in left hip: Secondary | ICD-10-CM | POA: Diagnosis not present

## 2018-11-24 MED ORDER — METHYLPREDNISOLONE ACETATE 40 MG/ML IJ SUSP
80.0000 mg | INTRAMUSCULAR | Status: AC | PRN
  Administered 2018-11-24: 16:00:00 80 mg via INTRA_ARTICULAR

## 2018-11-24 MED ORDER — BUPIVACAINE HCL 0.5 % IJ SOLN
2.0000 mL | INTRAMUSCULAR | Status: AC | PRN
  Administered 2018-11-24: 2 mL via INTRA_ARTICULAR

## 2018-11-24 MED ORDER — LIDOCAINE HCL 1 % IJ SOLN
2.0000 mL | INTRAMUSCULAR | Status: AC | PRN
  Administered 2018-11-24: 2 mL

## 2018-11-24 NOTE — Progress Notes (Signed)
Office Visit Note   Patient: Melinda Forbes           Date of Birth: 1965/10/06           MRN: [id number] Visit Date: 11/24/2018              Requested by: Carol Ada, [place],  [place] [address] PCP: Carol Ada, MD   Assessment & Plan: Visit Diagnoses:  1. 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  [name] is been experiencing pain along the superior aspect of the greater trochanter.  There is a reasonable chance that her pain may be referred from her hip and she may be a good candidate for an intra-articular hip injection.  First, I would like to try cortisone injection over the area of point tenderness at the tip of the trochanter.  This was performed and she thought she felt better.  If this continues to work we could always reinject it over time if if it does not have any persistent relief it may be worthwhile considering the intra-articular cortisone injection.  Follow-Up Instructions: Return if symptoms worsen or fail to improve.   Orders:  Orders Placed This Encounter  Procedures  . Large Joint Inj: L greater trochanter   No orders of the defined types were placed in this encounter.     Procedures: Large Joint Inj: L greater trochanter on 11/24/2018 4:04 PM Indications: pain and diagnostic evaluation Details: 25 G 1.5 in needle, lateral approach  Arthrogram: No  Medications: 2 mL lidocaine 1 %; 2 mL bupivacaine 0.5 %; 80 mg methylPREDNISolone acetate 40 MG/ML Procedure, treatment alternatives, risks and benefits explained, specific risks discussed. Consent was given by the patient. Immediately prior to procedure a time out was called to verify the correct patient, procedure, equipment, support staff and site/side marked as required. Patient was prepped and draped in the usual sterile fashion.       Clinical Data: No additional  findings.   Subjective: Chief Complaint  Patient presents with  . Left Hip - Follow-up  Patient presents today for a one month follow up on her left hip. She had her MRI done at [facility] on 11/10/2018 and is here today for those results.   HPI  Review of Systems   Objective: Vital Signs: BP 119/71   Pulse (!) 49   Ht 5\' 3"  (1.6 m)   Wt 120 lb (54.4 kg)   BMI 21.26 kg/m   Physical Exam Constitutional:      Appearance: She is well-developed.  Eyes:     Pupils: Pupils are equal, round, and reactive to light.  Pulmonary:     Effort: Pulmonary effort is normal.  Skin:    General: Skin is warm and dry.  Neurological:     Mental Status: She is alert and oriented to person, place, and time.  Psychiatric:        Behavior: Behavior normal.     Ortho Exam awake alert and oriented x3.  Comfortable sitting does not have a limp.  Has some tenderness directly over the tip of the greater trochanter left hip.  No pain with internal or external rotation either at neutral or with hyperflexion.  Skin intact.  Neurologically intact.  Specialty Comments:  No specialty comments available.  Imaging: No results found.   [place]  History: Patient Active Problem List   Diagnosis Date Noted  . Pain in left hip 10/27/2018  . History of recurrent UTIs 01/06/2012  . Hypothyroidism   . GERD (gastroesophageal reflux disease)    Past Medical History:  Diagnosis Date  . Chronic throat clearing    ENT eval 2008 by Dr. Ezzard StandingNewman showed normal vocal cords and " slight edema of the arytenoid mucosa possibly indicative of globus type sx's and possible reflux symptoms".  . GERD (gastroesophageal reflux disease)    omeprazole daily helps  . Hypothyroidism    managed by endocrinologist (Dr. Talmage NapBalan)  . Recurrent UTI    no hx of pyelonephritis    Family History  Problem Relation Age of Onset  . Diabetes Mother   . Breast cancer Mother   . Heart disease Father        first MI age 55  .  Hypertension Father   . Cancer Father        "cancer of the bone marrow"  . Cancer Maternal Grandmother        died of breast cancer age 55  . Breast cancer Maternal Grandmother     Past Surgical History:  Procedure Laterality Date  . CESAREAN SECTION  1988   first pregnancy.  Her second delivery was vaginal.  . CYST EXCISION  1999   Epidermal inclusion cyst in right groin (Dr. Johna SheriffHoxworth)  . GROIN DEBRIDEMENT     Social History   Occupational History  . Not on file  Tobacco Use  . Smoking status: Never Smoker  . Smokeless tobacco: Never Used  Substance and Sexual Activity  . Alcohol use: No  . Drug use: No  . Sexual activity: Not on file

## 2018-12-05 ENCOUNTER — Ambulatory Visit: Payer: Managed Care, Other (non HMO) | Admitting: Orthopaedic Surgery

## 2018-12-06 ENCOUNTER — Ambulatory Visit (INDEPENDENT_AMBULATORY_CARE_PROVIDER_SITE_OTHER): Payer: Managed Care, Other (non HMO) | Admitting: Orthopaedic Surgery

## 2018-12-06 DIAGNOSIS — M25552 Pain in left hip: Secondary | ICD-10-CM

## 2018-12-06 NOTE — Progress Notes (Signed)
Office Visit Note   Patient: Melinda Forbes           Date of Birth: [birth year]           MRN: [id number] Visit Date: 12/06/2018              Requested by: Carol Ada, [place],  [place] [address] PCP: Carol Ada, MD   Assessment & Plan: Visit Diagnoses:  1. Pain in left hip     Plan: The patient would definitely benefit from outpatient physical therapy and a home stretching program for her hip.  I showed her some exercises I want her to try and I recommended topical Voltaren gel as well.  We will try this for the next 4 weeks.  I would like her to be seen in follow-up with my partner Dr. Junius Roads because I would like him to evaluate her in 4 weeks from now assessing that trochanteric area under ultrasound and she may even benefit from an ultrasound-guided injection in the trochanteric area.  She agrees with this treatment plan as well.  All question concerns were answered and addressed.  We will have her follow-up again with my partner Dr. Junius Roads in 4 weeks.  Follow-Up Instructions: Return in about 4 weeks (around 01/03/2019).   Orders:  No orders of the defined types were placed in this encounter.  No orders of the defined types were placed in this encounter.     Procedures: No procedures performed   Clinical Data: No additional findings.   Subjective: Chief Complaint  Patient presents with  . Left Hip - Pain  The patient is a very pleasant 53 year old female who is seen my partner Dr. Durward Fortes recently due to left hip pain.  She points the trochanteric area the source of her pain.  She did have an MRI done at [facility] and is on the care everywhere system from epic and it showed just some mild degenerative changes in her left hip and a degenerative labral tear.  She denies any groin pain at all.  It hurts with hip abduction.  Hurts with pivoting getting in and out of her car.  It really flared up after she had gotten very active using  an elliptical machine.  She says it hurts on a daily basis and it can be a burning pain as well.  Dr. Durward Fortes did place a steroid injection around the area of maximal tenderness just 2 weeks ago and the patient said this did not help at all.  HPI  Review of Systems She currently has a headache, chest pain, shortness of breath, fever, chills, nausea, vomiting  Objective: Vital Signs: There were no vitals taken for this visit.  Physical Exam She is alert and orient x3 and in no acute distress Ortho Exam Examination of her left hip shows full and fluid range of motion with no pain in the groin at all.  Compressing the hip causes no pain.  When I have her lay in the lateral decubitus position with her left hip up she has severe pain over the trochanteric area which is easily reproducible Specialty Comments:  No specialty comments available.  Imaging: No results found. Independent review of the plain films of her left hip show that her left hip appears normal on plain films.  I did review the MRI report of her left hip as well.  PMFS History: Patient Active Problem List   Diagnosis Date Noted  . Pain  in left hip 10/27/2018  . History of recurrent UTIs 01/06/2012  . Hypothyroidism   . GERD (gastroesophageal reflux disease)    Past Medical History:  Diagnosis Date  . Chronic throat clearing    ENT eval 2008 by Dr. Ezzard StandingNewman showed normal vocal cords and " slight edema of the arytenoid mucosa possibly indicative of globus type sx's and possible reflux symptoms".  . GERD (gastroesophageal reflux disease)    omeprazole daily helps  . Hypothyroidism    managed by endocrinologist (Dr. Talmage NapBalan)  . Recurrent UTI    no hx of pyelonephritis    Family History  Problem Relation Age of Onset  . Diabetes Mother   . Breast cancer Mother   . Heart disease Father        first MI age 55  . Hypertension Father   . Cancer Father        "cancer of the bone marrow"  . Cancer Maternal Grandmother         died of breast cancer age 55  . Breast cancer Maternal Grandmother     Past Surgical History:  Procedure Laterality Date  . CESAREAN SECTION  1988   first pregnancy.  Her second delivery was vaginal.  . CYST EXCISION  1999   Epidermal inclusion cyst in right groin (Dr. Johna SheriffHoxworth)  . GROIN DEBRIDEMENT     Social History   Occupational History  . Not on file  Tobacco Use  . Smoking status: Never Smoker  . Smokeless tobacco: Never Used  Substance and Sexual Activity  . Alcohol use: No  . Drug use: No  . Sexual activity: Not on file

## 2019-01-03 ENCOUNTER — Ambulatory Visit: Payer: Managed Care, Other (non HMO) | Admitting: Family Medicine

## 2019-01-23 ENCOUNTER — Other Ambulatory Visit: Payer: Self-pay | Admitting: Family Medicine

## 2019-01-23 DIAGNOSIS — Z1231 Encounter for screening mammogram for malignant neoplasm of breast: Secondary | ICD-10-CM

## 2019-03-30 ENCOUNTER — Other Ambulatory Visit: Payer: Self-pay

## 2019-03-30 ENCOUNTER — Ambulatory Visit
Admission: RE | Admit: 2019-03-30 | Discharge: 2019-03-30 | Disposition: A | Discharge status: Home / Self Care | Payer: Managed Care, Other (non HMO) | Source: Ambulatory Visit | Attending: Family Medicine | Admitting: Family Medicine

## 2019-03-30 DIAGNOSIS — Z1231 Encounter for screening mammogram for malignant neoplasm of breast: Secondary | ICD-10-CM

## 2019-04-04 ENCOUNTER — Ambulatory Visit: Payer: Managed Care, Other (non HMO) | Attending: Internal Medicine

## 2019-04-04 DIAGNOSIS — Z20822 Contact with and (suspected) exposure to covid-19: Secondary | ICD-10-CM

## 2019-04-06 LAB — NOVEL CORONAVIRUS, NAA: SARS-CoV-2, NAA: NOT DETECTED

## 2019-11-30 ENCOUNTER — Encounter (HOSPITAL_COMMUNITY): Payer: Self-pay

## 2019-11-30 ENCOUNTER — Other Ambulatory Visit (HOSPITAL_BASED_OUTPATIENT_CLINIC_OR_DEPARTMENT_OTHER): Payer: Self-pay | Admitting: Family Medicine

## 2019-11-30 ENCOUNTER — Other Ambulatory Visit: Payer: Self-pay

## 2019-11-30 ENCOUNTER — Inpatient Hospital Stay (HOSPITAL_COMMUNITY)
Admission: EM | Admit: 2019-11-30 | Discharge: 2019-12-03 | DRG: 386 | Disposition: A | Discharge status: Home / Self Care | Payer: Managed Care, Other (non HMO) | Attending: Internal Medicine | Admitting: Internal Medicine

## 2019-11-30 ENCOUNTER — Inpatient Hospital Stay (HOSPITAL_BASED_OUTPATIENT_CLINIC_OR_DEPARTMENT_OTHER)
Admission: RE | Admit: 2019-11-30 | Discharge: 2019-11-30 | Disposition: A | Discharge status: Home / Self Care | Payer: Managed Care, Other (non HMO) | Source: Ambulatory Visit | Attending: Family Medicine | Admitting: Family Medicine

## 2019-11-30 DIAGNOSIS — E039 Hypothyroidism, unspecified: Secondary | ICD-10-CM | POA: Diagnosis present

## 2019-11-30 DIAGNOSIS — K219 Gastro-esophageal reflux disease without esophagitis: Secondary | ICD-10-CM | POA: Diagnosis present

## 2019-11-30 DIAGNOSIS — E876 Hypokalemia: Secondary | ICD-10-CM | POA: Diagnosis present

## 2019-11-30 DIAGNOSIS — L0291 Cutaneous abscess, unspecified: Secondary | ICD-10-CM | POA: Diagnosis present

## 2019-11-30 DIAGNOSIS — K630 Abscess of intestine: Secondary | ICD-10-CM

## 2019-11-30 DIAGNOSIS — K51314 Ulcerative (chronic) rectosigmoiditis with abscess: Principal | ICD-10-CM | POA: Diagnosis present

## 2019-11-30 DIAGNOSIS — R1030 Lower abdominal pain, unspecified: Secondary | ICD-10-CM | POA: Insufficient documentation

## 2019-11-30 DIAGNOSIS — R509 Fever, unspecified: Secondary | ICD-10-CM

## 2019-11-30 DIAGNOSIS — Z79899 Other long term (current) drug therapy: Secondary | ICD-10-CM

## 2019-11-30 DIAGNOSIS — Z87440 Personal history of urinary (tract) infections: Secondary | ICD-10-CM | POA: Diagnosis present

## 2019-11-30 DIAGNOSIS — E46 Unspecified protein-calorie malnutrition: Secondary | ICD-10-CM | POA: Diagnosis present

## 2019-11-30 DIAGNOSIS — Z20822 Contact with and (suspected) exposure to covid-19: Secondary | ICD-10-CM | POA: Diagnosis present

## 2019-11-30 DIAGNOSIS — K529 Noninfective gastroenteritis and colitis, unspecified: Secondary | ICD-10-CM | POA: Diagnosis not present

## 2019-11-30 DIAGNOSIS — R109 Unspecified abdominal pain: Secondary | ICD-10-CM | POA: Diagnosis not present

## 2019-11-30 DIAGNOSIS — Z681 Body mass index (BMI) 19 or less, adult: Secondary | ICD-10-CM

## 2019-11-30 DIAGNOSIS — Z79890 Hormone replacement therapy (postmenopausal): Secondary | ICD-10-CM

## 2019-11-30 LAB — CBC WITH DIFFERENTIAL/PLATELET
Abs Immature Granulocytes: 0.13 10*3/uL — ABNORMAL HIGH (ref 0.00–0.07)
Basophils Absolute: 0 10*3/uL (ref 0.0–0.1)
Basophils Relative: 0 %
Eosinophils Absolute: 0.1 10*3/uL (ref 0.0–0.5)
Eosinophils Relative: 2 %
HCT: 36.2 % (ref 36.0–46.0)
Hemoglobin: 11.8 g/dL — ABNORMAL LOW (ref 12.0–15.0)
Immature Granulocytes: 2 %
Lymphocytes Relative: 36 %
Lymphs Abs: 2.9 10*3/uL (ref 0.7–4.0)
MCH: 29.2 pg (ref 26.0–34.0)
MCHC: 32.6 g/dL (ref 30.0–36.0)
MCV: 89.6 fL (ref 80.0–100.0)
Monocytes Absolute: 0.6 10*3/uL (ref 0.1–1.0)
Monocytes Relative: 8 %
Neutro Abs: 4.1 10*3/uL (ref 1.7–7.7)
Neutrophils Relative %: 52 %
Platelets: 269 10*3/uL (ref 150–400)
RBC: 4.04 MIL/uL (ref 3.87–5.11)
RDW: 12.4 % (ref 11.5–15.5)
WBC: 7.9 10*3/uL (ref 4.0–10.5)
nRBC: 0 % (ref 0.0–0.2)

## 2019-11-30 LAB — COMPREHENSIVE METABOLIC PANEL
ALT: 10 U/L (ref 0–44)
AST: 15 U/L (ref 15–41)
Albumin: 3.8 g/dL (ref 3.5–5.0)
Alkaline Phosphatase: 60 U/L (ref 38–126)
Anion gap: 10 (ref 5–15)
BUN: 10 mg/dL (ref 6–20)
CO2: 27 mmol/L (ref 22–32)
Calcium: 8.8 mg/dL — ABNORMAL LOW (ref 8.9–10.3)
Chloride: 103 mmol/L (ref 98–111)
Creatinine, Ser: 0.68 mg/dL (ref 0.44–1.00)
GFR calc Af Amer: >60 mL/min (ref >60)
GFR calc non Af Amer: >60 mL/min (ref >60)
Glucose, Bld: 102 mg/dL — ABNORMAL HIGH (ref 70–99)
Potassium: 3.6 mmol/L (ref 3.5–5.1)
Sodium: 140 mmol/L (ref 135–145)
Total Bilirubin: 0.6 mg/dL (ref 0.3–1.2)
Total Protein: 7 g/dL (ref 6.5–8.1)

## 2019-11-30 LAB — LIPASE, BLOOD: Lipase: 31 U/L (ref 11–51)

## 2019-11-30 LAB — LACTIC ACID, PLASMA: Lactic Acid, Venous: 0.7 mmol/L (ref 0.5–1.9)

## 2019-11-30 LAB — SARS CORONAVIRUS 2 BY RT PCR (HOSPITAL ORDER, PERFORMED IN ~~LOC~~ HOSPITAL LAB): SARS Coronavirus 2: NEGATIVE

## 2019-11-30 MED ORDER — SODIUM CHLORIDE 0.9 % IV SOLN: 1.0000 g | INTRAVENOUS | Status: DC

## 2019-11-30 MED ORDER — ENOXAPARIN SODIUM 40 MG/0.4ML ~~LOC~~ SOLN
40.0000 mg | SUBCUTANEOUS | Status: DC
  Administered 2019-12-01 – 2019-12-02 (×3): 40 mg via SUBCUTANEOUS
  Filled 2019-11-30 (×3): qty 0.4

## 2019-11-30 MED ORDER — LACTATED RINGERS IV SOLN: INTRAVENOUS | Status: DC

## 2019-11-30 MED ORDER — SODIUM CHLORIDE 0.9 % IV SOLN: 2.0000 g | INTRAVENOUS | Status: DC

## 2019-11-30 MED ORDER — SODIUM CHLORIDE 0.9 % IV SOLN
1.0000 g | Freq: Once | INTRAVENOUS | Status: AC
  Administered 2019-11-30: 1 g via INTRAVENOUS
  Filled 2019-11-30: qty 10

## 2019-11-30 MED ORDER — ONDANSETRON HCL 4 MG PO TABS: 4.0000 mg | ORAL_TABLET | Freq: Four times a day (QID) | ORAL | Status: DC | PRN

## 2019-11-30 MED ORDER — MORPHINE SULFATE (PF) 2 MG/ML IV SOLN: 2.0000 mg | INTRAVENOUS | Status: DC | PRN

## 2019-11-30 MED ORDER — METRONIDAZOLE IN NACL 5-0.79 MG/ML-% IV SOLN
500.0000 mg | Freq: Three times a day (TID) | INTRAVENOUS | Status: DC
  Administered 2019-12-01: 500 mg via INTRAVENOUS
  Filled 2019-11-30: qty 100

## 2019-11-30 MED ORDER — ONDANSETRON HCL 4 MG/2ML IJ SOLN: 4.0000 mg | Freq: Four times a day (QID) | INTRAMUSCULAR | Status: DC | PRN

## 2019-11-30 MED ORDER — METRONIDAZOLE IN NACL 5-0.79 MG/ML-% IV SOLN
500.0000 mg | Freq: Once | INTRAVENOUS | Status: AC
  Administered 2019-11-30: 500 mg via INTRAVENOUS
  Filled 2019-11-30: qty 100

## 2019-11-30 MED ORDER — PANTOPRAZOLE SODIUM 40 MG PO TBEC
40.0000 mg | DELAYED_RELEASE_TABLET | Freq: Every day | ORAL | Status: DC
  Administered 2019-12-01 – 2019-12-02 (×3): 40 mg via ORAL
  Filled 2019-11-30 (×3): qty 1

## 2019-11-30 MED ORDER — ACETAMINOPHEN 650 MG RE SUPP: 650.0000 mg | Freq: Four times a day (QID) | RECTAL | Status: DC | PRN

## 2019-11-30 MED ORDER — IOHEXOL 300 MG/ML  SOLN
100.0000 mL | Freq: Once | INTRAMUSCULAR | Status: AC | PRN
  Administered 2019-11-30: 100 mL via INTRAVENOUS

## 2019-11-30 MED ORDER — ACETAMINOPHEN 325 MG PO TABS
650.0000 mg | ORAL_TABLET | Freq: Once | ORAL | Status: AC
  Administered 2019-11-30: 650 mg via ORAL
  Filled 2019-11-30: qty 2

## 2019-11-30 MED ORDER — LEVOTHYROXINE SODIUM 100 MCG PO TABS
100.0000 ug | ORAL_TABLET | Freq: Every day | ORAL | Status: DC
  Administered 2019-12-01: 100 ug via ORAL
  Filled 2019-11-30: qty 1

## 2019-11-30 MED ORDER — ACETAMINOPHEN 325 MG PO TABS: 650.0000 mg | ORAL_TABLET | Freq: Four times a day (QID) | ORAL | Status: DC | PRN

## 2019-11-30 NOTE — H&P (Signed)
History and Physical    Melinda Forbes UDJ:[id number] DOB: 07/07/1965 DOA: 11/30/2019  PCP: Merri Brunette, MD  Patient coming from: Home  I have personally briefly reviewed patient's old medical records in [facility]  Chief Complaint: Abd pain  HPI: Melinda Forbes is a 54 y.o. female with medical history significant of hypothyroidism.  Pt has had 9 day h/o intermittent N/V and abd pain.  Emesis is NBNB.  abd pain intermittent.  Particularly bad yesterday, seems slightly better today.  Fever up to 101 reported.  No fever today.  Had CT abd/pelvis today, PCP called pt and told her to go to ER.   ED Course: CT abd/pelvis shows proctocolitis with 43mm inflammatory gas and fluid collection.  Contained perf vs small intramural abscess.  No free air.  Pt started on rocephin / flagyl.  Gen surg consulted, hospitalist asked to admit.   Review of Systems: As per HPI, otherwise all review of systems negative.  Past Medical History:  Diagnosis Date  . Chronic throat clearing    ENT eval 2008 by Dr. Ezzard Standing showed normal vocal cords and " slight edema of the arytenoid mucosa possibly indicative of globus type sx's and possible reflux symptoms".  . GERD (gastroesophageal reflux disease)    omeprazole daily helps  . Hypothyroidism    managed by endocrinologist (Dr. Talmage Nap)  . Recurrent UTI    no hx of pyelonephritis    Past Surgical History:  Procedure Laterality Date  . CESAREAN SECTION  1988   first pregnancy.  Her second delivery was vaginal.  . CYST EXCISION  1999   Epidermal inclusion cyst in right groin (Dr. Johna Sheriff)  . GROIN DEBRIDEMENT       reports that she has never smoked. She has never used smokeless tobacco. She reports that she does not drink alcohol and does not use drugs.  Allergies  Allergen Reactions  . Other     NARCOTICS--PROJECTILE VOMITING    Family History  Problem Relation Age of Onset  . Diabetes Mother   . Breast cancer Mother   .  Heart disease Father        first MI age 79  . Hypertension Father   . Cancer Father        "cancer of the bone marrow"  . Cancer Maternal Grandmother        died of breast cancer age 52  . Breast cancer Maternal Grandmother      Prior to Admission medications   Medication Sig Start Date End Date Taking? Authorizing Provider  Calcium Carbonate-Vit D-Min (CALCIUM 1200 PO) Take 1 tablet by mouth at bedtime.    Yes [provider]  Cranberry 125 MG TABS Take 1 tablet by mouth at bedtime.    Yes [provider]  levothyroxine (SYNTHROID) 100 MCG tablet Take 100 mcg by mouth daily before breakfast.   Yes [provider]  naproxen sodium (ALEVE) 220 MG tablet Take 220 mg by mouth 2 (two) times daily as needed (pain).   Yes [provider]  omeprazole (PRILOSEC) 20 MG capsule TAKE 1 CAPSULE (20 MG TOTAL) BY MOUTH 2 (TWO) TIMES DAILY. Patient taking differently: Take 20 mg by mouth at bedtime.    Yes Jeoffrey Massed, MD    Physical Exam: Vitals:   11/30/19 2027 11/30/19 2200 11/30/19 2248  BP: (!) 145/83 120/81 120/78  Pulse: 66 77 (!) 54  Resp: 16  18  Temp: 98.1 F (36.7 C) 99 F (37.2  C)   TempSrc: Oral Oral   SpO2: 100% 99% 100%    Constitutional: NAD, calm, comfortable Eyes: PERRL, lids and conjunctivae normal ENMT: Mucous membranes are moist. Posterior pharynx clear of any exudate or lesions.Normal dentition.  Neck: normal, supple, no masses, no thyromegaly Respiratory: clear to auscultation bilaterally, no wheezing, no crackles. Normal respiratory effort. No accessory muscle use.  Cardiovascular: Regular rate and rhythm, no murmurs / rubs / gallops. No extremity edema. 2+ pedal pulses. No carotid bruits.  Abdomen: no tenderness, no masses palpated. No hepatosplenomegaly. Bowel sounds positive.  Musculoskeletal: no clubbing / cyanosis. No joint deformity upper and lower extremities. Good ROM, no contractures. Normal muscle tone.  Skin: no  rashes, lesions, ulcers. No induration Neurologic: CN 2-12 grossly intact. Sensation intact, DTR normal. Strength 5/5 in all 4.  Psychiatric: Normal judgment and insight. Alert and oriented x 3. Normal mood.    Labs on Admission: I have personally reviewed following labs and imaging studies  CBC: Recent Labs  Lab 11/30/19 2030  WBC 7.9  NEUTROABS 4.1  HGB 11.8*  HCT 36.2  MCV 89.6  PLT 269   Basic Metabolic Panel: Recent Labs  Lab 11/30/19 2030  NA 140  K 3.6  CL 103  CO2 27  GLUCOSE 102*  BUN 10  CREATININE 0.68  CALCIUM 8.8*   GFR: CrCl cannot be calculated (Unknown ideal weight.). Liver Function Tests: Recent Labs  Lab 11/30/19 2030  AST 15  ALT 10  ALKPHOS 60  BILITOT 0.6  PROT 7.0  ALBUMIN 3.8   Recent Labs  Lab 11/30/19 2030  LIPASE 31   No results for input(s): AMMONIA in the last 168 hours. Coagulation Profile: No results for input(s): INR, PROTIME in the last 168 hours. Cardiac Enzymes: No results for input(s): CKTOTAL, CKMB, CKMBINDEX, TROPONINI in the last 168 hours. BNP (last 3 results) No results for input(s): PROBNP in the last 8760 hours. HbA1C: No results for input(s): HGBA1C in the last 72 hours. CBG: No results for input(s): GLUCAP in the last 168 hours. Lipid Profile: No results for input(s): CHOL, HDL, LDLCALC, TRIG, CHOLHDL, LDLDIRECT in the last 72 hours. Thyroid Function Tests: No results for input(s): TSH, T4TOTAL, FREET4, T3FREE, THYROIDAB in the last 72 hours. Anemia Panel: No results for input(s): VITAMINB12, FOLATE, FERRITIN, TIBC, IRON, RETICCTPCT in the last 72 hours. Urine analysis: No results found for: COLORURINE, APPEARANCEUR, LABSPEC, PHURINE, GLUCOSEU, HGBUR, BILIRUBINUR, KETONESUR, PROTEINUR, UROBILINOGEN, NITRITE, LEUKOCYTESUR  Radiological Exams on Admission: CT ABDOMEN PELVIS W CONTRAST  Result Date: 11/30/2019 CLINICAL DATA:  Lower abdominal pain EXAM: CT ABDOMEN AND PELVIS WITH CONTRAST TECHNIQUE:  Multidetector CT imaging of the abdomen and pelvis was performed using the standard protocol following bolus administration of intravenous contrast. CONTRAST:  OMNIPAQUE IOHEXOL 300 MG/ML  SOLN COMPARISON:  None. FINDINGS: Lower chest: Lung bases demonstrate no acute consolidation or pleural effusion. Trace pericardial effusion. Hepatobiliary: Hepatic cysts. Subcentimeter hypodense liver lesions too small to further characterize. No calcified gallstone. No biliary dilatation Pancreas: Unremarkable. No pancreatic ductal dilatation or surrounding inflammatory changes. Spleen: Normal in size without focal abnormality. Adrenals/Urinary Tract: Adrenal glands are unremarkable. Kidneys are normal, without renal calculi, focal lesion, or hydronephrosis. Bladder is unremarkable. Stomach/Bowel: The stomach is nonenlarged. No dilated small bowel. Negative appendix. Wall thickening of the rectosigmoid colon with moderate surrounding inflammatory change. Small inflammatory gas and fluid collection measuring 12 mm. Vascular/Lymphatic: Nonaneurysmal aorta.  No suspicious nodes. Reproductive: Uterus and bilateral adnexa are unremarkable. Other: No free air.  Presacral and perirectal edema. Small fat containing umbilical hernia Musculoskeletal: No acute or significant osseous findings. IMPRESSION: 1. Wall thickening of the rectosigmoid colon with moderate surrounding inflammatory change, suspicious for proctocolitis of infectious, inflammatory, or ischemic etiology. 12 mm inflammatory gas and fluid collection adjacent to the distal rectosigmoid colon, indeterminate for contained perforation or small intramural abscess. There is no free air identified. 2. Trace pericardial effusion. These results will be called to the ordering clinician or representative by the Radiologist Assistant, and communication documented in the PACS or [employer]. Electronically Signed   By: Jasmine Pang M.D.   On: 11/30/2019 19:16    EKG:  Independently reviewed.  Assessment/Plan Principal Problem:   Proctocolitis with abscess Active Problems:   Hypothyroidism    1. Proctocolitis with abscess vs small contained perf - 1. NPO 2. IVF: NS at 73 3. Empiric rocephin flagyl 4. Repeat labs in AM 5. gen surg consult in AM - though I suspect this will be non-operative management, at least attempted at first. 6. Subjective fever at home, no objective SIRS here at this time. 2. Hypothyroidism - 1. Cont synthroid  DVT prophylaxis: Lovenox Code Status: Full Family Communication: No family in room Disposition Plan: Home after treatment for proctocolitis with abscess / perf Consults called: Gen surg called by EDP Admission status: Place in obs - probably to convert to IP tomorrow.   Ottilia Pippenger Judie Petit DO [facility]  How to contact the [facility] Attending or Consulting provider 7A - 7P or covering provider during after hours 7P -7A, for this patient?  1. Check the care team in [facility] and look for a) attending/consulting [facility] provider listed and b) the [facility] team listed 2. Log into [email or web address]  Amion Physician Scheduling and messaging for groups and whole hospitals  On call and physician scheduling software for group practices, residents, hospitalists and other medical providers for call, clinic, rotation and shift schedules. On[employer] is a hospital-wide system for scheduling doctors and paging doctors on call. EasyPlot is for scientific plotting and data analysis.  [email or web address]  and use [place]'s universal password to access. If you do not have the password, please contact the hospital operator.  3. Locate the [facility] provider you are looking for under [facility] and page to a number that you can be directly reached. 4. If you still have difficulty reaching the provider, please page the [facility] (Director on Call) for the Hospitalists listed on amion for assistance.  11/30/2019, 11:33 PM

## 2019-11-30 NOTE — ED Provider Notes (Addendum)
[facility]-EMERGENCY DEPT Provider Note   CSN: [id number] Arrival date & time: 11/30/19  1950     History Chief Complaint  Patient presents with  . Abdominal Pain    Melinda Forbes is a 54 y.o. female.  Presents to ER with concern for abnormal CT scan.  Patient reports symptoms for approximately 9 days, has been having intermittent nausea, vomiting, abdominal pain.  All episodes of vomiting are nonbloody nonbilious.  Abdominal pain is relatively intermittent, yesterday had a couple of bad episodes of abdominal pain.  Today however her pain seems to be improved.  She has also been having fevers, up to 101, yesterday fever up to 100, no fever today.  Reports that she had lab work completed yesterday which was grossly normal.  Her primary doctor ordered a CT scan of her abdomen which was completed today.  While she was on her way to dinner tonight, she received a phone call and instructions to go to ER with concern for bowel perforation.     CT showed 1. Wall thickening of the rectosigmoid colon with moderate surrounding inflammatory change, suspicious for proctocolitis of infectious, inflammatory, or ischemic etiology. 12 mm inflammatory gas and fluid collection adjacent to the distal rectosigmoid colon, indeterminate for contained perforation or small intramural abscess. There is no free air identified. 2. Trace pericardial effusion.   HPI     Past Medical History:  Diagnosis Date  . Chronic throat clearing    ENT eval 2008 by Dr. Ezzard Standing showed normal vocal cords and " slight edema of the arytenoid mucosa possibly indicative of globus type sx's and possible reflux symptoms".  . GERD (gastroesophageal reflux disease)    omeprazole daily helps  . Hypothyroidism    managed by endocrinologist (Dr. Talmage Nap)  . Recurrent UTI    no hx of pyelonephritis    Patient Active Problem List   Diagnosis Date Noted  . Pain in left hip 10/27/2018  . History of  recurrent UTIs 01/06/2012  . Hypothyroidism   . GERD (gastroesophageal reflux disease)     Past Surgical History:  Procedure Laterality Date  . CESAREAN SECTION  1988   first pregnancy.  Her second delivery was vaginal.  . CYST EXCISION  1999   Epidermal inclusion cyst in right groin (Dr. Johna Sheriff)  . GROIN DEBRIDEMENT       OB History   No obstetric history on file.     Family History  Problem Relation Age of Onset  . Diabetes Mother   . Breast cancer Mother   . Heart disease Father        first MI age 55  . Hypertension Father   . Cancer Father        "cancer of the bone marrow"  . Cancer Maternal Grandmother        died of breast cancer age 20  . Breast cancer Maternal Grandmother     Social History   Tobacco Use  . Smoking status: Never Smoker  . Smokeless tobacco: Never Used  Substance Use Topics  . Alcohol use: No  . Drug use: No    Home Medications Prior to Admission medications   Medication Sig Start Date End Date Taking? Authorizing Provider  Calcium Carbonate-Vit D-Min (CALCIUM 1200 PO) Take 1 tablet by mouth at bedtime.    Yes [provider]  Cranberry 125 MG TABS Take 1 tablet by mouth at bedtime.    Yes [provider]  levothyroxine (SYNTHROID) 100 MCG  tablet Take 100 mcg by mouth daily before breakfast.   Yes [provider]  naproxen sodium (ALEVE) 220 MG tablet Take 220 mg by mouth 2 (two) times daily as needed (pain).   Yes [provider]  omeprazole (PRILOSEC) 20 MG capsule TAKE 1 CAPSULE (20 MG TOTAL) BY MOUTH 2 (TWO) TIMES DAILY. Patient taking differently: Take 20 mg by mouth at bedtime.    Yes McGowen, Maryjean Morn, MD  ciprofloxacin (CIPRO) 500 MG tablet Take 1 tablet (500 mg total) by mouth 2 (two) times daily. Patient not taking: Reported on 11/30/2019 04/14/13   Jeoffrey Massed, MD  diazepam (VALIUM) 5 MG tablet Take 1 tablet (5 mg total) by mouth once as needed for up to 1 dose for anxiety. Take 2 hours  prior to MRI Patient not taking: Reported on 11/30/2019 10/27/18   Jetty Peeks, PA-C  omeprazole (PRILOSEC) 20 MG capsule TAKE 1 CAPSULE (20 MG TOTAL) BY MOUTH 2 (TWO) TIMES DAILY. Patient not taking: Reported on 11/30/2019 08/04/13   Jeoffrey Massed, MD  ranitidine (ZANTAC) 300 MG tablet Take 1 tablet (300 mg total) by mouth at bedtime. Patient not taking: Reported on 11/30/2019 06/24/12   McGowen, Maryjean Morn, MD    Allergies    Other  Review of Systems   Review of Systems  Constitutional: Negative for chills and fever.  HENT: Negative for ear pain and sore throat.   Eyes: Negative for pain and visual disturbance.  Respiratory: Negative for cough and shortness of breath.   Cardiovascular: Negative for chest pain and palpitations.  Gastrointestinal: Positive for abdominal pain, nausea and vomiting.  Genitourinary: Negative for dysuria and hematuria.  Musculoskeletal: Negative for arthralgias and back pain.  Skin: Negative for color change and rash.  Neurological: Negative for seizures and syncope.  All other systems reviewed and are negative.   Physical Exam Updated Vital Signs BP 120/78 (BP Location: Left Arm)   Pulse (!) 54   Temp 99 F (37.2 C) (Oral)   Resp 18   SpO2 100%   Physical Exam Vitals and nursing note reviewed.  Constitutional:      General: She is not in acute distress.    Appearance: She is well-developed.  HENT:     Head: Normocephalic and atraumatic.  Eyes:     Conjunctiva/sclera: Conjunctivae normal.  Cardiovascular:     Rate and Rhythm: Normal rate and regular rhythm.     Heart sounds: No murmur heard.   Pulmonary:     Effort: Pulmonary effort is normal. No respiratory distress.     Breath sounds: Normal breath sounds.  Abdominal:     General: Bowel sounds are normal.     Palpations: Abdomen is soft.     Tenderness: There is no guarding or rebound.     Comments: TTP in mid and lower abdomen  Musculoskeletal:     Cervical back: Neck supple.    Skin:    General: Skin is warm and dry.  Neurological:     Mental Status: She is alert.     ED Results / Procedures / Treatments   Labs (all labs ordered are listed, but only abnormal results are displayed) Labs Reviewed  CBC WITH DIFFERENTIAL/PLATELET - Abnormal; Notable for the following components:      Result Value   Hemoglobin 11.8 (*)    Abs Immature Granulocytes 0.13 (*)    All other components within normal limits  COMPREHENSIVE METABOLIC PANEL - Abnormal; Notable for the following components:  Glucose, Bld 102 (*)    Calcium 8.8 (*)    All other components within normal limits  SARS CORONAVIRUS 2 BY RT PCR (HOSPITAL ORDER, PERFORMED IN [facility])  LIPASE, BLOOD  LACTIC ACID, PLASMA  LACTIC ACID, PLASMA    EKG None  Radiology CT ABDOMEN PELVIS W CONTRAST  Result Date: 11/30/2019 CLINICAL DATA:  Lower abdominal pain EXAM: CT ABDOMEN AND PELVIS WITH CONTRAST TECHNIQUE: Multidetector CT imaging of the abdomen and pelvis was performed using the standard protocol following bolus administration of intravenous contrast. CONTRAST:  100mL OMNIPAQUE IOHEXOL 300 MG/ML  SOLN COMPARISON:  None. FINDINGS: Lower chest: Lung bases demonstrate no acute consolidation or pleural effusion. Trace pericardial effusion. Hepatobiliary: Hepatic cysts. Subcentimeter hypodense liver lesions too small to further characterize. No calcified gallstone. No biliary dilatation Pancreas: Unremarkable. No pancreatic ductal dilatation or surrounding inflammatory changes. Spleen: Normal in size without focal abnormality. Adrenals/Urinary Tract: Adrenal glands are unremarkable. Kidneys are normal, without renal calculi, focal lesion, or hydronephrosis. Bladder is unremarkable. Stomach/Bowel: The stomach is nonenlarged. No dilated small bowel. Negative appendix. Wall thickening of the rectosigmoid colon with moderate surrounding inflammatory change. Small inflammatory gas and fluid collection  measuring 12 mm. Vascular/Lymphatic: Nonaneurysmal aorta.  No suspicious nodes. Reproductive: Uterus and bilateral adnexa are unremarkable. Other: No free air. Presacral and perirectal edema. Small fat containing umbilical hernia Musculoskeletal: No acute or significant osseous findings. IMPRESSION: 1. Wall thickening of the rectosigmoid colon with moderate surrounding inflammatory change, suspicious for proctocolitis of infectious, inflammatory, or ischemic etiology. 12 mm inflammatory gas and fluid collection adjacent to the distal rectosigmoid colon, indeterminate for contained perforation or small intramural abscess. There is no free air identified. 2. Trace pericardial effusion. These results will be called to the ordering clinician or representative by the Radiologist Assistant, and communication documented in the PACS or [employer]. Electronically Signed   By: Jasmine PangKim  Fujinaga M.D.   On: 11/30/2019 19:16    Procedures Procedures (including critical care time)  Medications Ordered in ED Medications  metroNIDAZOLE (FLAGYL) IVPB 500 mg (500 mg Intravenous New Bag/Given 11/30/19 2220)  acetaminophen (TYLENOL) tablet 650 mg (has no administration in time range)  cefTRIAXone (ROCEPHIN) 1 g in sodium chloride 0.9 % 100 mL IVPB (0 g Intravenous Stopped 11/30/19 2205)    ED Course  I have reviewed the triage vital signs and the nursing notes.  Pertinent labs & imaging results that were available during my care of the patient were reviewed by me and considered in my medical decision making (see chart for details).    MDM Rules/Calculators/A&P                          54 year old lady who presented to ER with concern for abdominal pain, recent outpatient CT scan demonstrating proctocolitis with 12 mm area of inflammatory gas and fluid collection concerning for contained perforation or small intramural abscess.  Patient is currently well-appearing with stable vital signs, her abdomen is soft but  does have some local tenderness in her lower abdomen. Given CT findings, will admit for IV antibiotics.  Reviewed with general surgery - he will pass off for day team to formally see, no emergent surgical needs, agrees with IV abx and hosp admit.  Final Clinical Impression(s) / ED Diagnoses Final diagnoses:  Proctocolitis with abscess    Rx / DC Orders ED Discharge Orders    None       Amiera Herzberg, [place] Livingsichard S, MD  11/30/19 2320    Milagros Loll, MD 11/30/19 2322

## 2019-11-30 NOTE — ED Triage Notes (Signed)
Sent by her PCP Dr. Katrinka Blazing with concern for bowel perforation after a CT scan. She reports that she has been sick with cramping and N/V for 9 days. States that she actually feels a bit better today, but her doctor told her to come in.

## 2019-12-01 ENCOUNTER — Encounter: Payer: Self-pay | Admitting: Surgery

## 2019-12-01 DIAGNOSIS — R109 Unspecified abdominal pain: Secondary | ICD-10-CM | POA: Diagnosis present

## 2019-12-01 DIAGNOSIS — Z79890 Hormone replacement therapy (postmenopausal): Secondary | ICD-10-CM | POA: Diagnosis not present

## 2019-12-01 DIAGNOSIS — K529 Noninfective gastroenteritis and colitis, unspecified: Secondary | ICD-10-CM | POA: Diagnosis not present

## 2019-12-01 DIAGNOSIS — Z681 Body mass index (BMI) 19 or less, adult: Secondary | ICD-10-CM | POA: Diagnosis not present

## 2019-12-01 DIAGNOSIS — E46 Unspecified protein-calorie malnutrition: Secondary | ICD-10-CM | POA: Diagnosis present

## 2019-12-01 DIAGNOSIS — L0291 Cutaneous abscess, unspecified: Secondary | ICD-10-CM | POA: Diagnosis present

## 2019-12-01 DIAGNOSIS — Z79899 Other long term (current) drug therapy: Secondary | ICD-10-CM | POA: Diagnosis not present

## 2019-12-01 DIAGNOSIS — K630 Abscess of intestine: Secondary | ICD-10-CM | POA: Diagnosis not present

## 2019-12-01 DIAGNOSIS — K219 Gastro-esophageal reflux disease without esophagitis: Secondary | ICD-10-CM | POA: Diagnosis present

## 2019-12-01 DIAGNOSIS — K51314 Ulcerative (chronic) rectosigmoiditis with abscess: Secondary | ICD-10-CM | POA: Diagnosis present

## 2019-12-01 DIAGNOSIS — E039 Hypothyroidism, unspecified: Secondary | ICD-10-CM | POA: Diagnosis present

## 2019-12-01 DIAGNOSIS — Z20822 Contact with and (suspected) exposure to covid-19: Secondary | ICD-10-CM | POA: Diagnosis present

## 2019-12-01 DIAGNOSIS — E876 Hypokalemia: Secondary | ICD-10-CM | POA: Insufficient documentation

## 2019-12-01 LAB — CBC
HCT: 32.4 % — ABNORMAL LOW (ref 36.0–46.0)
Hemoglobin: 10.7 g/dL — ABNORMAL LOW (ref 12.0–15.0)
MCH: 29.2 pg (ref 26.0–34.0)
MCHC: 33 g/dL (ref 30.0–36.0)
MCV: 88.5 fL (ref 80.0–100.0)
Platelets: 224 10*3/uL (ref 150–400)
RBC: 3.66 MIL/uL — ABNORMAL LOW (ref 3.87–5.11)
RDW: 12.4 % (ref 11.5–15.5)
WBC: 7.1 10*3/uL (ref 4.0–10.5)
nRBC: 0 % (ref 0.0–0.2)

## 2019-12-01 LAB — COMPREHENSIVE METABOLIC PANEL
ALT: 9 U/L (ref 0–44)
AST: 13 U/L — ABNORMAL LOW (ref 15–41)
Albumin: 3.2 g/dL — ABNORMAL LOW (ref 3.5–5.0)
Alkaline Phosphatase: 48 U/L (ref 38–126)
Anion gap: 9 (ref 5–15)
BUN: 8 mg/dL (ref 6–20)
CO2: 24 mmol/L (ref 22–32)
Calcium: 8 mg/dL — ABNORMAL LOW (ref 8.9–10.3)
Chloride: 107 mmol/L (ref 98–111)
Creatinine, Ser: 0.59 mg/dL (ref 0.44–1.00)
GFR calc Af Amer: >60 mL/min (ref >60)
GFR calc non Af Amer: >60 mL/min (ref >60)
Glucose, Bld: 101 mg/dL — ABNORMAL HIGH (ref 70–99)
Potassium: 3.4 mmol/L — ABNORMAL LOW (ref 3.5–5.1)
Sodium: 140 mmol/L (ref 135–145)
Total Bilirubin: 0.6 mg/dL (ref 0.3–1.2)
Total Protein: 5.8 g/dL — ABNORMAL LOW (ref 6.5–8.1)

## 2019-12-01 LAB — PREALBUMIN: Prealbumin: 14.1 mg/dL — ABNORMAL LOW (ref 18–38)

## 2019-12-01 LAB — LACTIC ACID, PLASMA: Lactic Acid, Venous: 1 mmol/L (ref 0.5–1.9)

## 2019-12-01 LAB — MAGNESIUM: Magnesium: 2.2 mg/dL (ref 1.7–2.4)

## 2019-12-01 LAB — PHOSPHORUS: Phosphorus: 3 mg/dL (ref 2.5–4.6)

## 2019-12-01 LAB — HIV ANTIBODY (ROUTINE TESTING W REFLEX): HIV Screen 4th Generation wRfx: NONREACTIVE

## 2019-12-01 MED ORDER — ACETAMINOPHEN 325 MG PO TABS
325.0000 mg | ORAL_TABLET | Freq: Four times a day (QID) | ORAL | Status: DC | PRN
  Administered 2019-12-01: 650 mg via ORAL
  Filled 2019-12-01: qty 2

## 2019-12-01 MED ORDER — LACTATED RINGERS IV BOLUS: 1000.0000 mL | Freq: Three times a day (TID) | INTRAVENOUS | Status: AC | PRN

## 2019-12-01 MED ORDER — PHENOL 1.4 % MT LIQD: 1.0000 | OROMUCOSAL | Status: DC | PRN

## 2019-12-01 MED ORDER — ALUM & MAG HYDROXIDE-SIMETH 200-200-20 MG/5ML PO SUSP: 30.0000 mL | Freq: Four times a day (QID) | ORAL | Status: DC | PRN

## 2019-12-01 MED ORDER — LIP MEDEX EX OINT
1.0000 "application " | TOPICAL_OINTMENT | Freq: Two times a day (BID) | CUTANEOUS | Status: DC
  Administered 2019-12-01 – 2019-12-03 (×3): 1 via TOPICAL
  Filled 2019-12-01 (×2): qty 7

## 2019-12-01 MED ORDER — METHOCARBAMOL 1000 MG/10ML IJ SOLN
1000.0000 mg | Freq: Four times a day (QID) | INTRAVENOUS | Status: DC | PRN
  Filled 2019-12-01: qty 10

## 2019-12-01 MED ORDER — MAGIC MOUTHWASH
15.0000 mL | Freq: Four times a day (QID) | ORAL | Status: DC | PRN
  Filled 2019-12-01: qty 15

## 2019-12-01 MED ORDER — HYDROMORPHONE HCL 1 MG/ML IJ SOLN: 0.5000 mg | INTRAMUSCULAR | Status: DC | PRN

## 2019-12-01 MED ORDER — PIPERACILLIN-TAZOBACTAM 3.375 G IVPB
3.3750 g | Freq: Three times a day (TID) | INTRAVENOUS | Status: DC
  Administered 2019-12-01 – 2019-12-03 (×7): 3.375 g via INTRAVENOUS
  Filled 2019-12-01 (×8): qty 50

## 2019-12-01 MED ORDER — POTASSIUM CHLORIDE 10 MEQ/100ML IV SOLN
10.0000 meq | INTRAVENOUS | Status: DC
  Administered 2019-12-01 (×3): 10 meq via INTRAVENOUS
  Filled 2019-12-01: qty 100

## 2019-12-01 MED ORDER — HYDROCORTISONE (PERIANAL) 2.5 % EX CREA
1.0000 "application " | TOPICAL_CREAM | Freq: Four times a day (QID) | CUTANEOUS | Status: DC | PRN
  Filled 2019-12-01: qty 28.35

## 2019-12-01 MED ORDER — PROCHLORPERAZINE EDISYLATE 10 MG/2ML IJ SOLN
5.0000 mg | INTRAMUSCULAR | Status: DC | PRN
  Administered 2019-12-02: 10 mg via INTRAVENOUS
  Filled 2019-12-01: qty 2

## 2019-12-01 MED ORDER — OXYCODONE HCL 5 MG PO TABS: 5.0000 mg | ORAL_TABLET | ORAL | Status: DC | PRN

## 2019-12-01 MED ORDER — LEVOTHYROXINE SODIUM 88 MCG PO TABS
88.0000 ug | ORAL_TABLET | Freq: Every day | ORAL | Status: DC
  Administered 2019-12-02 – 2019-12-03 (×2): 88 ug via ORAL
  Filled 2019-12-01 (×2): qty 1

## 2019-12-01 MED ORDER — GUAIFENESIN-DM 100-10 MG/5ML PO SYRP: 10.0000 mL | ORAL_SOLUTION | ORAL | Status: DC | PRN

## 2019-12-01 MED ORDER — SODIUM CHLORIDE 0.9 % IV SOLN
8.0000 mg | Freq: Four times a day (QID) | INTRAVENOUS | Status: DC | PRN
  Filled 2019-12-01: qty 4

## 2019-12-01 MED ORDER — MENTHOL 3 MG MT LOZG: 1.0000 | LOZENGE | OROMUCOSAL | Status: DC | PRN

## 2019-12-01 MED ORDER — POTASSIUM CHLORIDE 10 MEQ/100ML IV SOLN
10.0000 meq | Freq: Once | INTRAVENOUS | Status: AC
  Administered 2019-12-01: 10 meq via INTRAVENOUS
  Filled 2019-12-01: qty 100

## 2019-12-01 MED ORDER — ONDANSETRON HCL 4 MG/2ML IJ SOLN
4.0000 mg | Freq: Four times a day (QID) | INTRAMUSCULAR | Status: DC | PRN
  Administered 2019-12-02: 4 mg via INTRAVENOUS
  Filled 2019-12-01: qty 2

## 2019-12-01 MED ORDER — HYDROCORTISONE 1 % EX CREA
1.0000 "application " | TOPICAL_CREAM | Freq: Three times a day (TID) | CUTANEOUS | Status: DC | PRN
  Filled 2019-12-01: qty 28

## 2019-12-01 NOTE — Consult Note (Signed)
Melinda Forbes  1965/07/25 [id number]  CARE TEAM:  PCP: Merri Brunette, MD  Outpatient Care Team: Patient Care Team: Merri Brunette, MD as PCP - General (Family Medicine) Dorisann Frames, MD as Consulting Physician (Endocrinology)  Inpatient Treatment Team: Treatment Team: Attending Provider: Fran Lowes, DO; Rounding Team: Shanda Howells, MD; Rounding Team: Montez Morita, Md, MD; Technician: Stamps, Quincy Carnes, NT; Utilization Review: Chalmers Guest, RN   This patient is a 54 y.o.female who presents today for surgical evaluation at the request of Dr Stevie Kern, [facility].   Chief complaint / Reason for evaluation: Abdominal pain with sigmoid inflammation and possible abscess.  Proctocolitis versus diverticulitis.  54 year old female.  History of hypothyroidism and GERD.  Has had worsening abdominal discomfort for over a week.  Had crampiness and discomfort.  While she told others she had thrown up, she denies much in the way of nausea vomiting.  No obvious bleeding or hematemesis or coffee-ground emesis.  Seem to intensify and get worse.  Check temperature and had fever up to 101.21F  Thought she was feeling better yesterday but then felt worse and came to emergency room.  Worried that she may have Covid.  No shortness of breath or respiratory symptoms.  Abdominal pain discomfort.  CT scan showed thickening of rectosigmoid colon with possible perforation.  Recommendation made for medical and surgical consultations.  Patient notes her father struggle of diverticulitis.  She recalls getting a colonoscopy around age 47 that was totally normal.  She believes it through the [place] system but she cannot remember the gastroenterologist.  She usually moves her bowels about 3 times a day.  She does note with these flares that she has been feeling constipated and now feeling loose.  No hematochezia or melena.  No personal nor family history of GI/colon cancer, inflammatory bowel disease, irritable bowel syndrome,  allergy such as Celiac Sprue, dietary/dairy problems, colitis, ulcers nor gastritis.  No recent sick contacts/gastroenteritis.  No travel outside the country.  No changes in diet.  No dysphagia to solids or liquids.  No significant heartburn or reflux.  No hematochezia, hematemesis, coffee ground emesis.  No evidence of prior gastric/peptic ulceration.    Assessment  Melinda Forbes  54 y.o. female       Problem List:  Principal Problem:   Proctocolitis with abscess Active Problems:   History of recurrent UTIs   Hypothyroidism   GERD (gastroesophageal reflux disease)   Hypokalemia   Abdominal pain nausea vomiting with thickening of rectosigmoid colon and possibly rectum.  Probable perforation as well.  Plan:  Agree with hospitalization.  Would do bowel rest.  Sips okay today.  If pain goes down and feeling better, consider advancing diet tomorrow.  IV antibiotics.  Then given probable perforation would do IV piperacillin/tazobactam for more complicated probable diverticulitis.  Hard to know if this is ulcerative colitis or Crohn's/inflammatory bowel disease versus perforated diverticulitis.  Does not change initial plan.  Would definitely benefit from endoscopy at some point.  Patient recalls having a colonoscopy around age 71.  She believes it is through the [place] system.  She recalls it being rather normal, she remembers asking about if she had any diverticulosis since her father struggled with diverticulitis in the past.  She recalls being told it was normal.  Records not available.  Try and see if we can see that report.  Try and hold off on emergent intervention if possible.  Hypokalemia.  Correct.  Check magnesium to rule out  hypomagnesemia.  Hypothyroidism.  Synthroid per primary service.  GERD.  Proton pump inhibitor.  Agree with scheduled for now.  Surgery will help follow.  There is no indications for emergency surgery at this time.  She may benefit from follow-up CT  scan in 4-5 days to rule out delayed abscess formation if she does not improve.  -VTE prophylaxis- SCDs, etc -mobilize as tolerated to help recovery  40 minutes spent in review, evaluation, examination, counseling, and coordination of care.  More than 50% of that time was spent in counseling.  Ardeth SportsmanSteven C. Jaryd Drew, MD, FACS, MASCRS Gastrointestinal and Minimally Invasive Surgery  [facility] [address] [place], [place] [id number] [phone] Fax [phone] Main/Paging  CONTACT INFORMATION: Weekday (9AM-5PM) concerns: Call [facility] main office at [phone] Weeknight (5PM-9AM) or Weekend/Holiday concerns: Check [email or web address] for General Surgery [facility] coverage (Please, do not use SecureChat as it is not reliable communication to operating surgeons for immediate patient care)      12/01/2019      Past Medical History:  Diagnosis Date  . Chronic throat clearing    ENT eval 2008 by Dr. Ezzard StandingNewman showed normal vocal cords and " slight edema of the arytenoid mucosa possibly indicative of globus type sx's and possible reflux symptoms".  . GERD (gastroesophageal reflux disease)    omeprazole daily helps  . Hypothyroidism    managed by endocrinologist (Dr. Talmage NapBalan)  . Recurrent UTI    no hx of pyelonephritis    Past Surgical History:  Procedure Laterality Date  . CESAREAN SECTION  1988   first pregnancy.  Her second delivery was vaginal.  . CYST EXCISION  1999   Epidermal inclusion cyst in right groin (Dr. Johna SheriffHoxworth)  . GROIN DEBRIDEMENT      Social History   Socioeconomic History  . Marital status: Married    Spouse name: Not on file  . Number of children: Not on file  . Years of education: Not on file  . Highest education level: Not on file  Occupational History  . Not on file  Tobacco Use  . Smoking status: Never Smoker  . Smokeless tobacco: Never Used  Substance and Sexual Activity  . Alcohol use: No  . Drug use: No  . Sexual activity:  Not on file  Other Topics Concern  . Not on file  Social History Narrative   Divorced, engaged.     Three grown children.   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  Bachelor's degree from [place]     [place]   No T/A/Ds.   Exercise: walks a lot (1-2 times per week).   She tries to eat healthy.   Social Determinants of Health   Financial Resource Strain:   . Difficulty of Paying Living Expenses: Not on file  Food Insecurity:   . Worried About Programme researcher, broadcasting/film/videounning Out of Food in the Last Year: Not on file  . Ran Out of Food in the Last Year: Not on file  Transportation Needs:   . Lack of Transportation (Medical): Not on file  . Lack of Transportation (Non-Medical): Not on file  Physical Activity:   . Days of Exercise per Week: Not on file  . Minutes of Exercise per Session: Not on file  Stress:   . Feeling of Stress : Not on file  Social Connections:   . Frequency of Communication with Friends and Family: Not on file  . Frequency of Social Gatherings with Friends  and Family: Not on file  . Attends Religious Services: Not on file  . Active Member of Clubs or Organizations: Not on file  . Attends Banker Meetings: Not on file  . Marital Status: Not on file  Intimate Partner Violence:   . Fear of Current or Ex-Partner: Not on file  . Emotionally Abused: Not on file  . Physically Abused: Not on file  . Sexually Abused: Not on file    Family History  Problem Relation Age of Onset  . Diabetes Mother   . Breast cancer Mother   . Heart disease Father        first MI age 67  . Hypertension Father   . Cancer Father        "cancer of the bone marrow"  . Cancer Maternal Grandmother        died of breast cancer age 29  . Breast cancer Maternal Grandmother     Current Facility-Administered Medications  Medication Dose Route Frequency Provider Last Rate Last Admin  . acetaminophen (TYLENOL) tablet 325-650 mg  325-650 mg Oral Q6H PRN [name], MD      . alum & mag hydroxide-simeth (MAALOX/MYLANTA) 200-200-20 MG/5ML suspension 30 mL  30 mL Oral Q6H PRN [name], MD      . enoxaparin (LOVENOX) injection 40 mg  40 mg Subcutaneous Q24H Julian Reil, Jared M, DO   40 mg at 12/01/19 0001  . guaiFENesin-dextromethorphan (ROBITUSSIN DM) 100-10 MG/5ML syrup 10 mL  10 mL Oral Q4H PRN [name], MD      . hydrocortisone (ANUSOL-HC) 2.5 % rectal cream 1 application  1 application Topical QID PRN [name], MD      . hydrocortisone cream 1 % 1 application  1 application Topical TID PRN [name], MD      . HYDROmorphone (DILAUDID) injection 0.5-2 mg  0.5-2 mg Intravenous Q2H PRN [name], MD      . lactated ringers bolus 1,000 mL  1,000 mL Intravenous Q8H PRN [name], MD      . lactated ringers infusion   Intravenous Continuous Hillary Bow, DO   Paused at 12/01/19 [phone]  . levothyroxine (SYNTHROID) tablet 100 mcg  100 mcg Oral QAC breakfast Hillary Bow, DO   100 mcg at 12/01/19 8527  . lip balm (CARMEX) ointment 1 application  1 application Topical BID [name], MD      . magic mouthwash  15 mL Oral QID PRN [name], MD      . menthol-cetylpyridinium (CEPACOL) lozenge 3 mg  1 lozenge Oral PRN [name], MD      . methocarbamol (ROBAXIN) 1,000 mg in dextrose 5 % 100 mL IVPB  1,000 mg Intravenous Q6H PRN [name], MD      . morphine 2 MG/ML injection 2-4 mg  2-4 mg Intravenous Q4H PRN Hillary Bow, DO      . ondansetron [facility]) injection 4 mg  4 mg Intravenous Q6H PRN [name], MD       Or  . ondansetron (ZOFRAN) 8 mg in sodium chloride 0.9 % 50 mL IVPB  8 mg Intravenous Q6H PRN [name], MD      . ondansetron [facility]) tablet 4 mg  4 mg Oral Q6H PRN Hillary Bow, DO      . oxyCODONE (Oxy IR/ROXICODONE) immediate release tablet 5-10 mg  5-10 mg Oral Q4H PRN [name], MD      . pantoprazole (PROTONIX) EC tablet  40 mg  40 mg Oral QHS Lyda Perone M, DO   40 mg at  12/01/19 0000  . phenol (CHLORASEPTIC) mouth spray 1-2 spray  1-2 spray Mouth/Throat PRN [name], MD      . piperacillin-tazobactam (ZOSYN) IVPB 3.375 g  3.375 g Intravenous Trixie Deis, MD      . potassium chloride 10 mEq in 100 mL IVPB  10 mEq Intravenous Q1 Hr x 4 Clevon Khader, Viviann Spare, MD      . prochlorperazine (COMPAZINE) injection 5-10 mg  5-10 mg Intravenous Q4H PRN [name], MD         Allergies  Allergen Reactions  . Other     NARCOTICS--PROJECTILE VOMITING    ROS:   All other systems reviewed & are negative except per HPI or as noted below: Constitutional:  +fevers.  No chills, sweats.  Weight stable Eyes:  No vision changes, No discharge HENT:  No sore throats, nasal drainage Lymph: No neck swelling, No bruising easily Pulmonary:  No cough, productive sputum CV: No orthopnea, PND  Patient walks 120 minutes for about 5 miles without difficulty.  No exertional chest/neck/shoulder/arm pain. GI:  No personal nor family history of GI/colon cancer, inflammatory bowel disease, irritable bowel syndrome, allergy such as Celiac Sprue, dietary/dairy problems, colitis, ulcers nor gastritis.  No recent sick contacts/gastroenteritis.  No travel outside the country.  No changes in diet. Renal: No UTIs, No hematuria Genital:  No drainage, bleeding, masses Musculoskeletal: No severe joint pain.  Good ROM major joints Skin:  No sores or lesions.  No rashes Heme/Lymph:  No easy bleeding.  No swollen lymph nodes Neuro: No focal weakness/numbness.  No seizures Psych: No suicidal ideation.  No hallucinations  BP 100/65 (BP Location: Right Arm)   Pulse 60   Temp 98.1 F (36.7 C) (Oral)   Resp 17   Ht 5\' 3"  (1.6 m)   Wt 47.6 kg   SpO2 98%   BMI 18.59 kg/m   Physical Exam: Constitutional: Not cachectic.  Hygeine adequate.  Vitals signs as above.   Eyes: Pupils reactive, normal extraocular movements. Sclera nonicteric Neuro: CN II-XII intact.  No major focal sensory defects.  No  major motor deficits. Lymph: No head/neck/groin lymphadenopathy Psych:  No severe agitation.  Mildly anxious and occasionally interrupting but no pressured speech.  No mania.  Judgment & insight Adequate, Oriented x4, HENT: Normocephalic, Mucus membranes moist.  No thrush.   Neck: Supple, No tracheal deviation.  No obvious thyromegaly Chest: No pain to chest wall compression.  Good respiratory excursion.  No audible wheezing CV:  Pulses intact.  Regular rhythm.  No major extremity edema Abdomen:  Soft.  Nondistended.  Tenderness at LLQ> suprapubic. No incarcerated hernias.  No hepatomegaly.  No splenomegaly Gen:  No inguinal hernias.  No inguinal lymphadenopathy.   Ext: No obvious deformity or contracture no significant edema.  No cyanosis Skin: No major subcutaneous nodules.  Warm and dry Musculoskeletal: Severe joint rigidity not present.  No obvious clubbing.  No digital petechiae.     Results:   Labs: Results for orders placed or performed during the hospital encounter of 11/30/19 (from the past 48 hour(s))  CBC with Differential     Status: Abnormal   Collection Time: 11/30/19  8:30 PM  Result Value Ref Range   WBC 7.9 4.0 - 10.5 K/uL   RBC 4.04 3.87 - 5.11 MIL/uL   Hemoglobin 11.8 (L) 12.0 - 15.0 g/dL   HCT 12/02/19 36 - 46 %  MCV 89.6 80.0 - 100.0 fL   MCH 29.2 26.0 - 34.0 pg   MCHC 32.6 30.0 - 36.0 g/dL   RDW 16.1 09.6 - 04.5 %   Platelets 269 150 - 400 K/uL   nRBC 0.0 0.0 - 0.2 %   Neutrophils Relative % 52 %   Neutro Abs 4.1 1.7 - 7.7 K/uL   Lymphocytes Relative 36 %   Lymphs Abs 2.9 0.7 - 4.0 K/uL   Monocytes Relative 8 %   Monocytes Absolute 0.6 0 - 1 K/uL   Eosinophils Relative 2 %   Eosinophils Absolute 0.1 0 - 0 K/uL   Basophils Relative 0 %   Basophils Absolute 0.0 0 - 0 K/uL   Immature Granulocytes 2 %   Abs Immature Granulocytes 0.13 (H) 0.00 - 0.07 K/uL    Comment: Performed at [facility], [address], [place], [place] [address]    Comprehensive metabolic panel     Status: Abnormal   Collection Time: 11/30/19  8:30 PM  Result Value Ref Range   Sodium 140 135 - 145 mmol/L   Potassium 3.6 3.5 - 5.1 mmol/L   Chloride 103 98 - 111 mmol/L   CO2 27 22 - 32 mmol/L   Glucose, Bld 102 (H) 70 - 99 mg/dL    Comment: Glucose reference range applies only to samples taken after fasting for at least 8 hours.   BUN 10 6 - 20 mg/dL   Creatinine, Ser 1.91 0.44 - 1.00 mg/dL   Calcium 8.8 (L) 8.9 - 10.3 mg/dL   Total Protein 7.0 6.5 - 8.1 g/dL   Albumin 3.8 3.5 - 5.0 g/dL   AST 15 15 - 41 U/L   ALT 10 0 - 44 U/L   Alkaline Phosphatase 60 38 - 126 U/L   Total Bilirubin 0.6 0.3 - 1.2 mg/dL   GFR calc non Af Amer >60 >60 mL/min   GFR calc Af Amer >60 >60 mL/min   Anion gap 10 5 - 15    Comment: Performed at [facility] [address] [place], [place] [address]  Lipase, blood     Status: None   Collection Time: 11/30/19  8:30 PM  Result Value Ref Range   Lipase 31 11 - 51 U/L    Comment: Performed at [facility], [address] [place], [place] [address]  Lactic acid, plasma     Status: None   Collection Time: 11/30/19  8:30 PM  Result Value Ref Range   Lactic Acid, Venous 0.7 0.5 - 1.9 mmol/L    Comment: Performed at [facility], [address], [place], [place] [address]  SARS Coronavirus 2 by RT PCR (hospital order, performed in [facility] hospital lab) Nasopharyngeal Nasopharyngeal Swab     Status: None   Collection Time: 11/30/19 10:00 PM   Specimen: Nasopharyngeal Swab  Result Value Ref Range   SARS Coronavirus 2 NEGATIVE NEGATIVE    Comment: (NOTE) SARS-CoV-2 target nucleic acids are NOT DETECTED.  The SARS-CoV-2 RNA is generally detectable in upper and lower respiratory specimens during the acute phase of infection. The lowest concentration of SARS-CoV-2 viral copies this assay can detect is 250 copies / mL. A negative result does not preclude  SARS-CoV-2 infection and should not be used as the sole basis for treatment or other patient management decisions.  A negative result may occur with improper specimen collection / handling, submission of specimen other than nasopharyngeal swab, presence  of viral mutation(s) within the areas targeted by this assay, and inadequate number of viral copies (<250 copies / mL). A negative result must be combined with clinical observations, patient history, and epidemiological information.  Fact Sheet for Patients:   BoilerBrush.com.cy  Fact Sheet for Healthcare Providers: https://pope.com/  This test is not yet approved or  cleared by the [place] FDA and has been authorized for detection and/or diagnosis of SARS-CoV-2 by FDA under an Emergency Use Authorization (EUA).  This EUA will remain in effect (meaning this test can be used) for the duration of the COVID-19 declaration under Section 564(b)(1) of the Act, 21 U.S.C. section 360bbb-3(b)(1), unless the authorization is terminated or revoked sooner.  Performed at [facility], [address] [place], [place] [address]   Lactic acid, plasma     Status: None   Collection Time: 12/01/19  1:18 AM  Result Value Ref Range   Lactic Acid, Venous 1.0 0.5 - 1.9 mmol/L    Comment: Performed at [facility], [address] [place], [place] [address]  CBC     Status: Abnormal   Collection Time: 12/01/19  2:36 AM  Result Value Ref Range   WBC 7.1 4.0 - 10.5 K/uL   RBC 3.66 (L) 3.87 - 5.11 MIL/uL   Hemoglobin 10.7 (L) 12.0 - 15.0 g/dL   HCT 09.8 (L) 36 - 46 %   MCV 88.5 80.0 - 100.0 fL   MCH 29.2 26.0 - 34.0 pg   MCHC 33.0 30.0 - 36.0 g/dL   RDW 11.9 14.7 - 82.9 %   Platelets 224 150 - 400 K/uL   nRBC 0.0 0.0 - 0.2 %    Comment: Performed at [facility], [address] [place], [place] [address]  Comprehensive metabolic panel      Status: Abnormal   Collection Time: 12/01/19  2:36 AM  Result Value Ref Range   Sodium 140 135 - 145 mmol/L   Potassium 3.4 (L) 3.5 - 5.1 mmol/L   Chloride 107 98 - 111 mmol/L   CO2 24 22 - 32 mmol/L   Glucose, Bld 101 (H) 70 - 99 mg/dL    Comment: Glucose reference range applies only to samples taken after fasting for at least 8 hours.   BUN 8 6 - 20 mg/dL   Creatinine, Ser 0.86 0.44 - 1.00 mg/dL   Calcium 8.0 (L) 8.9 - 10.3 mg/dL   Total Protein 5.8 (L) 6.5 - 8.1 g/dL   Albumin 3.2 (L) 3.5 - 5.0 g/dL   AST 13 (L) 15 - 41 U/L   ALT 9 0 - 44 U/L   Alkaline Phosphatase 48 38 - 126 U/L   Total Bilirubin 0.6 0.3 - 1.2 mg/dL   GFR calc non Af Amer >60 >60 mL/min   GFR calc Af Amer >60 >60 mL/min   Anion gap 9 5 - 15    Comment: Performed at [facility], [address] [place], [place] [address]    Imaging / Studies: CT ABDOMEN PELVIS W CONTRAST  Result Date: 11/30/2019 CLINICAL DATA:  Lower abdominal pain EXAM: CT ABDOMEN AND PELVIS WITH CONTRAST TECHNIQUE: Multidetector CT imaging of the abdomen and pelvis was performed using the standard protocol following bolus administration of intravenous contrast. CONTRAST:  OMNIPAQUE IOHEXOL 300 MG/ML  SOLN COMPARISON:  None. FINDINGS: Lower chest: Lung bases demonstrate no acute consolidation or pleural effusion. Trace pericardial effusion. Hepatobiliary: Hepatic cysts. Subcentimeter hypodense liver lesions too  small to further characterize. No calcified gallstone. No biliary dilatation Pancreas: Unremarkable. No pancreatic ductal dilatation or surrounding inflammatory changes. Spleen: Normal in size without focal abnormality. Adrenals/Urinary Tract: Adrenal glands are unremarkable. Kidneys are normal, without renal calculi, focal lesion, or hydronephrosis. Bladder is unremarkable. Stomach/Bowel: The stomach is nonenlarged. No dilated small bowel. Negative appendix. Wall thickening of the rectosigmoid colon with moderate  surrounding inflammatory change. Small inflammatory gas and fluid collection measuring 12 mm. Vascular/Lymphatic: Nonaneurysmal aorta.  No suspicious nodes. Reproductive: Uterus and bilateral adnexa are unremarkable. Other: No free air. Presacral and perirectal edema. Small fat containing umbilical hernia Musculoskeletal: No acute or significant osseous findings. IMPRESSION: 1. Wall thickening of the rectosigmoid colon with moderate surrounding inflammatory change, suspicious for proctocolitis of infectious, inflammatory, or ischemic etiology. 12 mm inflammatory gas and fluid collection adjacent to the distal rectosigmoid colon, indeterminate for contained perforation or small intramural abscess. There is no free air identified. 2. Trace pericardial effusion. These results will be called to the ordering clinician or representative by the Radiologist Assistant, and communication documented in the PACS or [employer]. Electronically Signed   By: Jasmine Pang M.D.   On: 11/30/2019 19:16    Medications / Allergies: per chart  Antibiotics: Anti-infectives (From admission, onward)   Start     Dose/Rate Route Frequency Ordered Stop   12/01/19 2100  cefTRIAXone (ROCEPHIN) 1 g in sodium chloride 0.9 % 100 mL IVPB  Status:  Discontinued        1 g 200 mL/hr over 30 Minutes Intravenous Every 24 hours 11/30/19 2325 11/30/19 2327   12/01/19 2100  cefTRIAXone (ROCEPHIN) 2 g in sodium chloride 0.9 % 100 mL IVPB  Status:  Discontinued        2 g 200 mL/hr over 30 Minutes Intravenous Every 24 hours 11/30/19 2327 12/01/19 0824   12/01/19 0830  piperacillin-tazobactam (ZOSYN) IVPB 3.375 g        3.375 g 12.5 mL/hr over 240 Minutes Intravenous Every 8 hours 12/01/19 0824     12/01/19 0600  metroNIDAZOLE (FLAGYL) IVPB 500 mg  Status:  Discontinued        500 mg 100 mL/hr over 60 Minutes Intravenous Every 8 hours 11/30/19 2325 12/01/19 0824   11/30/19 2115  cefTRIAXone (ROCEPHIN) 1 g in sodium chloride 0.9 % 100  mL IVPB        1 g 200 mL/hr over 30 Minutes Intravenous  Once 11/30/19 2110 11/30/19 2205   11/30/19 2115  metroNIDAZOLE (FLAGYL) IVPB 500 mg        500 mg 100 mL/hr over 60 Minutes Intravenous  Once 11/30/19 2110 11/30/19 2320        Note: Portions of this report may have been transcribed using voice recognition software. Every effort was made to ensure accuracy; however, inadvertent computerized transcription errors may be present.   Any transcriptional errors that result from this process are unintentional.    Ardeth Sportsman, MD, FACS, MASCRS Gastrointestinal and Minimally Invasive Surgery  [facility] [address] [place], [place] [address] [phone] Fax [phone] Main/Paging  CONTACT INFORMATION: Weekday (9AM-5PM) concerns: Call [facility] main office at [phone] Weeknight (5PM-9AM) or Weekend/Holiday concerns: Check [email or web address] for General Surgery [facility] coverage (Please, do not use SecureChat as it is not reliable communication to operating surgeons for immediate patient care)      12/01/2019  8:24 AM

## 2019-12-01 NOTE — Plan of Care (Signed)
Plan of care discussed.   

## 2019-12-01 NOTE — Progress Notes (Signed)
PROGRESS NOTE  Melinda Forbes PJK:[id number] DOB: April 13, 1966 DOA: 11/30/2019 PCP: Merri Brunette, MD  Brief History   Melinda Forbes is a 54 y.o. female with medical history significant of hypothyroidism.  Pt has had 9 day h/o intermittent N/V and abd pain.  Emesis is NBNB.  abd pain intermittent.  Particularly bad yesterday, seems slightly better today.  Fever up to 101 reported.  No fever today.  Had CT abd/pelvis today, PCP called pt and told her to go to ER.   ED Course: CT abd/pelvis shows proctocolitis with 66mm inflammatory gas and fluid collection.  Contained perf vs small intramural abscess.  No free air.  Gen surg consulted, hospitalist asked to admit.  Pt started on rocephin / flagyl. Now changed to Zosyn by general surgery.  The patient has been seen by Dr. Michaell Cowing of general surgery. He has recommended bowel rest for now and possible advancing of diet tomorrow if she is feeling better. She is receiving IV antibiotics. This has been changed to Zosyn by surgery.   Consultants   General surgery  Procedures   None  Antibiotics   Anti-infectives (From admission, onward)   Start     Dose/Rate Route Frequency Ordered Stop   12/01/19 2100  cefTRIAXone (ROCEPHIN) 1 g in sodium chloride 0.9 % 100 mL IVPB  Status:  Discontinued        1 g 200 mL/hr over 30 Minutes Intravenous Every 24 hours 11/30/19 2325 11/30/19 2327   12/01/19 2100  cefTRIAXone (ROCEPHIN) 2 g in sodium chloride 0.9 % 100 mL IVPB  Status:  Discontinued        2 g 200 mL/hr over 30 Minutes Intravenous Every 24 hours 11/30/19 2327 12/01/19 0824   12/01/19 1000  piperacillin-tazobactam (ZOSYN) IVPB 3.375 g        3.375 g 12.5 mL/hr over 240 Minutes Intravenous Every 8 hours 12/01/19 0824     12/01/19 0600  metroNIDAZOLE (FLAGYL) IVPB 500 mg  Status:  Discontinued        500 mg 100 mL/hr over 60 Minutes Intravenous Every 8 hours 11/30/19 2325 12/01/19 0824   11/30/19 2115  cefTRIAXone (ROCEPHIN) 1 g  in sodium chloride 0.9 % 100 mL IVPB        1 g 200 mL/hr over 30 Minutes Intravenous  Once 11/30/19 2110 11/30/19 2205   11/30/19 2115  metroNIDAZOLE (FLAGYL) IVPB 500 mg        500 mg 100 mL/hr over 60 Minutes Intravenous  Once 11/30/19 2110 11/30/19 2320      Subjective  The patient is resting comfortably. Feeling better.  Objective   Vitals:  Vitals:   12/01/19 0555 12/01/19 1402  BP: 100/65 105/70  Pulse: 60 (!) 54  Resp: 17 17  Temp: 98.1 F (36.7 C) 98.9 F (37.2 C)  SpO2: 98% 100%   Exam:  Constitutional:   The patient is awake, alert, and oriented x 3. No acute distress. Respiratory:   No increased work of breathing.  No wheezes, rales, or rhonchi  No tactile fremitus Cardiovascular:   Regular rate and rhythm  No murmurs, ectopy, or gallups.  No lateral PMI. No thrills. Abdomen:   Abdomen is soft, diffusely tender, slightly distended  No hernias, masses, or organomegaly  Hypoactive bowel sounds.  Musculoskeletal:   No cyanosis, clubbing, or edema Skin:   No rashes, lesions, ulcers  palpation of skin: no induration or nodules Neurologic:   CN 2-12 intact  Sensation all 4 extremities intact  Psychiatric:   Mental status o Mood, affect appropriate o Orientation to person, place, time   judgment and insight appear intact  I have personally reviewed the following:   Today's Data   Vitals, Prealbumin, CMP, CBC  Imaging   CT abdomen and pelvis.  Scheduled Meds:  enoxaparin (LOVENOX) injection  40 mg Subcutaneous Q24H   [START ON 12/02/2019] levothyroxine  88 mcg Oral QAC breakfast   lip balm  1 application Topical BID   pantoprazole  40 mg Oral QHS   Continuous Infusions:  lactated ringers     lactated ringers Stopped (12/01/19 0529)   methocarbamol (ROBAXIN) IV     ondansetron (ZOFRAN) IV     piperacillin-tazobactam (ZOSYN)  IV 3.375 g (12/01/19 0927)    Principal Problem:   Proctocolitis with abscess Active  Problems:   History of recurrent UTIs   Hypothyroidism   GERD (gastroesophageal reflux disease)   Hypokalemia   Abscess   LOS: 0 days   A & P   Protocolitis with abscess/ small contained perforation: The patient states that her pain is much better this morning. She has been evaluated by general surgery. Dr. Michaell Cowing has put the patient on bowel rest, and changed antibiotics to Zosyn. Conservative management for now.  Hypokalemia: Noted. Supplement and monitor.  Protein calorie malnutrition: Nutrition consult following resolution of bowel rest.  Hypothyroidism: Continue Synthroid as at home.  GERD: Continue PPI as at home.  I have seen and examined this patient myself. I have spent 34 minutes in her evaluation and care.  DVT Prophylaxis: Lovenox. CODE STATUS: Full Code Family Communication: None available Disposition: Status is: Inpatient  Remains inpatient appropriate because:IV treatments appropriate due to intensity of illness or inability to take PO  Dispo: The patient is from: Home              Anticipated d/c is to: Home              Anticipated d/c date is: 2 days              Patient currently is not medically stable to d/c.  Kiev Labrosse, DO [facility] Direct contact: see [email or web address]  7PM-7AM contact night coverage as above 12/01/2019, 2:25 PM  LOS: 0 days

## 2019-12-01 NOTE — Progress Notes (Signed)
Initial Nutrition Assessment  DOCUMENTATION CODES:   Not applicable  INTERVENTION:  - diet advancement as medically feasible.  NUTRITION DIAGNOSIS:   Inadequate oral intake related to inability to eat as evidenced by NPO status.  GOAL:   Patient will meet greater than or equal to 90% of their needs  MONITOR:   Diet advancement, Labs, Weight trends  REASON FOR ASSESSMENT:   Malnutrition Screening Tool  ASSESSMENT:   54 y.o. female with medical history of hypothyroidism. She presented to the ED with 9 day hx of intermittent N/V and abdominal pain. CT abdomen/pelvis done the day PTA and showed perforation.  Patient has been NPO since admission. Notes indicate intermittent N/V PTA but patient denies having these symptoms PTA. She reports that she was having abdominal pain and intermittent diarrhea and constipation. Patient reports that until 10 days ago she was in her normal state of health, eating well, and having regular BMs. She has never had an issue with constipation before.  She reports abdominal pain is less severe today but is still occurring. She informs RD that she was told by Surgeon that she is allowed to have a small cup of clear liquids/hr. Provided patient with a strawberry jello which she plans to consume very slowly. She had a few sips of water with medication this AM and denies any worsening of abdominal discomfort with that.   Despite NFPE findings, hesitant to state malnutrition. Will continue to monitor. Patient reports that weight had been stable. She last ate solid food yesterday for lunch and was feeling hungry for dinner, on the way to dinner, when she got the call to go to the ED.    Labs reviewed; K: 3.4 mmol/l, Ca: 8 mg/dl. Medications reviewed; 100 mcg oral synthroid/day, 10 mEq IV KCl x4 runs 8/20. IVF; LR @ 75 ml/hr.     NUTRITION - FOCUSED PHYSICAL EXAM:    Most Recent Value  Orbital Region No depletion  Upper Arm Region Mild depletion   Thoracic and Lumbar Region Unable to assess  Buccal Region No depletion  Temple Region No depletion  Clavicle Bone Region Mild depletion  Clavicle and Acromion Bone Region Mild depletion  Scapular Bone Region No depletion  Dorsal Hand No depletion  Patellar Region Mild depletion  Anterior Thigh Region Unable to assess  Posterior Calf Region Mild depletion  Edema (RD Assessment) Unable to assess  Hair Reviewed  Eyes Reviewed  Mouth Reviewed  Skin Reviewed  Nails Reviewed       Diet Order:   Diet Order            Diet NPO time specified Except for: Sips with Meds  Diet effective now                 EDUCATION NEEDS:   No education needs have been identified at this time  Skin:  Skin Assessment: Reviewed RN Assessment  Last BM:  8/20  Height:   Ht Readings from Last 1 Encounters:  12/01/19 5\' 3"  (1.6 m)    Weight:   Wt Readings from Last 1 Encounters:  12/01/19 47.6 kg     Estimated Nutritional Needs:  Kcal:  1500-1700 kcal Protein:  75-90 grams Fluid:  >/= 1.8 L/day     12/03/19, MS, RD, LDN, CNSC Inpatient Clinical Dietitian RD pager # available in AMION  After hours/weekend pager # available in [facility]

## 2019-12-02 LAB — CBC
HCT: 34.7 % — ABNORMAL LOW (ref 36.0–46.0)
Hemoglobin: 11.2 g/dL — ABNORMAL LOW (ref 12.0–15.0)
MCH: 29.2 pg (ref 26.0–34.0)
MCHC: 32.3 g/dL (ref 30.0–36.0)
MCV: 90.4 fL (ref 80.0–100.0)
Platelets: 218 10*3/uL (ref 150–400)
RBC: 3.84 MIL/uL — ABNORMAL LOW (ref 3.87–5.11)
RDW: 12.3 % (ref 11.5–15.5)
WBC: 6.6 10*3/uL (ref 4.0–10.5)
nRBC: 0 % (ref 0.0–0.2)

## 2019-12-02 LAB — BASIC METABOLIC PANEL
Anion gap: 10 (ref 5–15)
BUN: 7 mg/dL (ref 6–20)
CO2: 24 mmol/L (ref 22–32)
Calcium: 8.2 mg/dL — ABNORMAL LOW (ref 8.9–10.3)
Chloride: 106 mmol/L (ref 98–111)
Creatinine, Ser: 0.65 mg/dL (ref 0.44–1.00)
GFR calc Af Amer: >60 mL/min (ref >60)
GFR calc non Af Amer: >60 mL/min (ref >60)
Glucose, Bld: 82 mg/dL (ref 70–99)
Potassium: 3.7 mmol/L (ref 3.5–5.1)
Sodium: 140 mmol/L (ref 135–145)

## 2019-12-02 LAB — MAGNESIUM: Magnesium: 2.1 mg/dL (ref 1.7–2.4)

## 2019-12-02 NOTE — Progress Notes (Signed)
Progress Note: General Surgery Service   Chief Complaint/Subjective: Some nausea this morning, anxious, cramping pain, diarrhea overnight  Objective: Vital signs in last 24 hours: Temp:  [97.8 F (36.6 C)-98.9 F (37.2 C)] 97.9 F (36.6 C) (08/21 0459) Pulse Rate:  [52-58] 58 (08/21 0459) Resp:  [16-17] 16 (08/21 0459) BP: (98-112)/(63-70) 112/65 (08/21 0459) SpO2:  [97 %-100 %] 97 % (08/21 0459) Last BM Date: 12/01/19  Intake/Output from previous day: 08/20 0701 - 08/21 0700 In: 1674.3 [P.O.:60; I.V.:1465; IV Piggyback:149.3] Out: 5 [Urine:3; Stool:2] Intake/Output this shift: No intake/output data recorded.  Gen: NAD  Resp: CTAB  Card: RRR  Abd: soft, slight pain on LLQ palpation, no guarding  Lab Results: CBC  Recent Labs    12/01/19 0236 12/02/19 0306  WBC 7.1 6.6  HGB 10.7* 11.2*  HCT 32.4* 34.7*  PLT 224 218   BMET Recent Labs    12/01/19 0236 12/02/19 0306  NA 140 140  K 3.4* 3.7  CL 107 106  CO2 24 24  GLUCOSE 101* 82  BUN 8 7  CREATININE 0.59 0.65  CALCIUM 8.0* 8.2*   PT/INR No results for input(s): LABPROT, INR in the last 72 hours. ABG No results for input(s): PHART, HCO3 in the last 72 hours.  Invalid input(s): PCO2, PO2  Anti-infectives: Anti-infectives (From admission, onward)   Start     Dose/Rate Route Frequency Ordered Stop   12/01/19 2100  cefTRIAXone (ROCEPHIN) 1 g in sodium chloride 0.9 % 100 mL IVPB  Status:  Discontinued        1 g 200 mL/hr over 30 Minutes Intravenous Every 24 hours 11/30/19 2325 11/30/19 2327   12/01/19 2100  cefTRIAXone (ROCEPHIN) 2 g in sodium chloride 0.9 % 100 mL IVPB  Status:  Discontinued        2 g 200 mL/hr over 30 Minutes Intravenous Every 24 hours 11/30/19 2327 12/01/19 0824   12/01/19 1000  piperacillin-tazobactam (ZOSYN) IVPB 3.375 g        3.375 g 12.5 mL/hr over 240 Minutes Intravenous Every 8 hours 12/01/19 0824     12/01/19 0600  metroNIDAZOLE (FLAGYL) IVPB 500 mg  Status:   Discontinued        500 mg 100 mL/hr over 60 Minutes Intravenous Every 8 hours 11/30/19 2325 12/01/19 0824   11/30/19 2115  cefTRIAXone (ROCEPHIN) 1 g in sodium chloride 0.9 % 100 mL IVPB        1 g 200 mL/hr over 30 Minutes Intravenous  Once 11/30/19 2110 11/30/19 2205   11/30/19 2115  metroNIDAZOLE (FLAGYL) IVPB 500 mg        500 mg 100 mL/hr over 60 Minutes Intravenous  Once 11/30/19 2110 11/30/19 2320      Medications: Scheduled Meds: . enoxaparin (LOVENOX) injection  40 mg Subcutaneous Q24H  . levothyroxine  88 mcg Oral QAC breakfast  . lip balm  1 application Topical BID  . pantoprazole  40 mg Oral QHS   Continuous Infusions: . lactated ringers    . lactated ringers 75 mL/hr at 12/02/19 0600  . methocarbamol (ROBAXIN) IV    . ondansetron (ZOFRAN) IV    . piperacillin-tazobactam (ZOSYN)  IV Stopped (12/02/19 0537)   PRN Meds:.acetaminophen, alum & mag hydroxide-simeth, guaiFENesin-dextromethorphan, hydrocortisone, hydrocortisone cream, HYDROmorphone (DILAUDID) injection, lactated ringers, magic mouthwash, menthol-cetylpyridinium, methocarbamol (ROBAXIN) IV, morphine injection, ondansetron (ZOFRAN) IV **OR** ondansetron (ZOFRAN) IV, ondansetron **OR** [DISCONTINUED] ondansetron (ZOFRAN) IV, oxyCODONE, phenol, prochlorperazine  Assessment/Plan: 54 yo female with abdominal pain and inflammation  of sigmoid colon consistent with diverticulitis vs IBD. Patient anxious about "colon leaking", reassured her she is not showing decompensating signs or symptoms. -continue abx -advance to liquids today -ambulate -pain control    LOS: 1 day   Rodman Pickle, MD [phone] [facility]

## 2019-12-02 NOTE — Plan of Care (Signed)
Plan of care reviewed and discussed with the patient. 

## 2019-12-02 NOTE — Progress Notes (Signed)
PROGRESS NOTE  Melinda Forbes VOJ:[id number] DOB: 1965-06-08 DOA: 11/30/2019 PCP: Merri Brunette, MD  Brief History   Melinda Forbes is a 54 y.o. female with medical history significant of hypothyroidism.  Pt has had 9 day h/o intermittent N/V and abd pain.  Emesis is NBNB.  abd pain intermittent.  Particularly bad yesterday, seems slightly better today.  Fever up to 101 reported.  No fever today.  Had CT abd/pelvis today, PCP called pt and told her to go to ER.  ED Course: CT abd/pelvis shows proctocolitis with 46mm inflammatory gas and fluid collection.  Contained perf vs small intramural abscess.  No free air.  Gen surg consulted, hospitalist asked to admit.  Pt started on rocephin / flagyl. Now changed to Zosyn by general surgery.  The patient has been seen by Dr. Michaell Cowing of general surgery. He has recommended bowel rest for now and possible advancing of diet tomorrow if she is feeling better. She is receiving IV antibiotics. This has been changed to Zosyn by surgery. She has been advanced to a clear liquid diet by general surgery, and she will ambulate.  Consultants   General surgery  Procedures   None  Antibiotics   Anti-infectives (From admission, onward)   Start     Dose/Rate Route Frequency Ordered Stop   12/01/19 2100  cefTRIAXone (ROCEPHIN) 1 g in sodium chloride 0.9 % 100 mL IVPB  Status:  Discontinued        1 g 200 mL/hr over 30 Minutes Intravenous Every 24 hours 11/30/19 2325 11/30/19 2327   12/01/19 2100  cefTRIAXone (ROCEPHIN) 2 g in sodium chloride 0.9 % 100 mL IVPB  Status:  Discontinued        2 g 200 mL/hr over 30 Minutes Intravenous Every 24 hours 11/30/19 2327 12/01/19 0824   12/01/19 1000  piperacillin-tazobactam (ZOSYN) IVPB 3.375 g        3.375 g 12.5 mL/hr over 240 Minutes Intravenous Every 8 hours 12/01/19 0824     12/01/19 0600  metroNIDAZOLE (FLAGYL) IVPB 500 mg  Status:  Discontinued        500 mg 100 mL/hr over 60 Minutes Intravenous  Every 8 hours 11/30/19 2325 12/01/19 0824   11/30/19 2115  cefTRIAXone (ROCEPHIN) 1 g in sodium chloride 0.9 % 100 mL IVPB        1 g 200 mL/hr over 30 Minutes Intravenous  Once 11/30/19 2110 11/30/19 2205   11/30/19 2115  metroNIDAZOLE (FLAGYL) IVPB 500 mg        500 mg 100 mL/hr over 60 Minutes Intravenous  Once 11/30/19 2110 11/30/19 2320     Subjective  The patient is resting comfortably. No new complaints.  Objective   Vitals:  Vitals:   12/02/19 0459 12/02/19 1345  BP: 112/65 107/61  Pulse: (!) 58 (!) 54  Resp: 16 18  Temp: 97.9 F (36.6 C) 98.4 F (36.9 C)  SpO2: 97% 98%   Exam:  Constitutional:   The patient is awake, alert, and oriented x 3. No acute distress. Respiratory:   No increased work of breathing.  No wheezes, rales, or rhonchi  No tactile fremitus Cardiovascular:   Regular rate and rhythm  No murmurs, ectopy, or gallups.  No lateral PMI. No thrills. Abdomen:   Abdomen is soft, mildly tender in left lower quadrant. No distention.  No hernias, masses, or organomegaly  Hypoactive bowel sounds.  Musculoskeletal:   No cyanosis, clubbing, or edema Skin:   No rashes, lesions, ulcers  palpation of skin: no induration or nodules Neurologic:   CN 2-12 intact  Sensation all 4 extremities intact Psychiatric:   Mental status o Mood, affect appropriate o Orientation to person, place, time   judgment and insight appear intact  I have personally reviewed the following:   Today's Data   Vitals, BMP, CBC  Imaging   CT abdomen and pelvis.  Scheduled Meds:  enoxaparin (LOVENOX) injection  40 mg Subcutaneous Q24H   levothyroxine  88 mcg Oral QAC breakfast   lip balm  1 application Topical BID   pantoprazole  40 mg Oral QHS   Continuous Infusions:  lactated ringers     lactated ringers 75 mL/hr at 12/02/19 0600   methocarbamol (ROBAXIN) IV     ondansetron (ZOFRAN) IV     piperacillin-tazobactam (ZOSYN)  IV 3.375 g  (12/02/19 1044)    Principal Problem:   Proctocolitis with abscess Active Problems:   History of recurrent UTIs   Hypothyroidism   GERD (gastroesophageal reflux disease)   Hypokalemia   Abscess   LOS: 1 day   A & P   Protocolitis with abscess/ small contained perforation: The patient states that her pain is much better this morning. She has been evaluated by general surgery. Dr. Michaell Cowing has antibiotics to Zosyn. Conservative management for now. Today her diet has been advanced to clear liquids. She will also begin to ambulate in the halls.  Hypokalemia: Resolved. Supplement and monitor.  Protein calorie malnutrition: Nutrition consult following resolution of bowel rest.  Hypothyroidism: Continue Synthroid as at home.  GERD: Continue PPI as at home.  I have seen and examined this patient myself. I have spent 32 minutes in her evaluation and care.  DVT Prophylaxis: Lovenox. CODE STATUS: Full Code Family Communication: None available Disposition: Status is: Inpatient  Remains inpatient appropriate because:IV treatments appropriate due to intensity of illness or inability to take PO  Dispo: The patient is from: Home              Anticipated d/c is to: Home              Anticipated d/c date is: 2 days              Patient currently is not medically stable to d/c.  Melinda Raska, DO [facility] Direct contact: see [email or web address]  7PM-7AM contact night coverage as above 12/02/2019, 1:53 PM  LOS: 0 days

## 2019-12-03 LAB — COMPREHENSIVE METABOLIC PANEL
ALT: 7 U/L (ref 0–44)
AST: 12 U/L — ABNORMAL LOW (ref 15–41)
Albumin: 3.1 g/dL — ABNORMAL LOW (ref 3.5–5.0)
Alkaline Phosphatase: 41 U/L (ref 38–126)
Anion gap: 9 (ref 5–15)
BUN: 6 mg/dL (ref 6–20)
CO2: 25 mmol/L (ref 22–32)
Calcium: 8.2 mg/dL — ABNORMAL LOW (ref 8.9–10.3)
Chloride: 106 mmol/L (ref 98–111)
Creatinine, Ser: 0.68 mg/dL (ref 0.44–1.00)
GFR calc Af Amer: >60 mL/min (ref >60)
GFR calc non Af Amer: >60 mL/min (ref >60)
Glucose, Bld: 103 mg/dL — ABNORMAL HIGH (ref 70–99)
Potassium: 3.3 mmol/L — ABNORMAL LOW (ref 3.5–5.1)
Sodium: 140 mmol/L (ref 135–145)
Total Bilirubin: 0.3 mg/dL (ref 0.3–1.2)
Total Protein: 5.7 g/dL — ABNORMAL LOW (ref 6.5–8.1)

## 2019-12-03 LAB — CBC WITH DIFFERENTIAL/PLATELET
Abs Immature Granulocytes: 0.11 10*3/uL — ABNORMAL HIGH (ref 0.00–0.07)
Basophils Absolute: 0 10*3/uL (ref 0.0–0.1)
Basophils Relative: 1 %
Eosinophils Absolute: 0.2 10*3/uL (ref 0.0–0.5)
Eosinophils Relative: 2 %
HCT: 33.6 % — ABNORMAL LOW (ref 36.0–46.0)
Hemoglobin: 11.3 g/dL — ABNORMAL LOW (ref 12.0–15.0)
Immature Granulocytes: 2 %
Lymphocytes Relative: 38 %
Lymphs Abs: 2.9 10*3/uL (ref 0.7–4.0)
MCH: 29.4 pg (ref 26.0–34.0)
MCHC: 33.6 g/dL (ref 30.0–36.0)
MCV: 87.5 fL (ref 80.0–100.0)
Monocytes Absolute: 0.5 10*3/uL (ref 0.1–1.0)
Monocytes Relative: 6 %
Neutro Abs: 3.8 10*3/uL (ref 1.7–7.7)
Neutrophils Relative %: 51 %
Platelets: 242 10*3/uL (ref 150–400)
RBC: 3.84 MIL/uL — ABNORMAL LOW (ref 3.87–5.11)
RDW: 12.3 % (ref 11.5–15.5)
WBC: 7.5 10*3/uL (ref 4.0–10.5)
nRBC: 0 % (ref 0.0–0.2)

## 2019-12-03 MED ORDER — HYDROCORTISONE (PERIANAL) 2.5 % EX CREA: 1.0000 "application " | TOPICAL_CREAM | Freq: Four times a day (QID) | CUTANEOUS | 0 refills | Status: DC | PRN

## 2019-12-03 MED ORDER — AMOXICILLIN-POT CLAVULANATE 875-125 MG PO TABS: 1.0000 | ORAL_TABLET | Freq: Two times a day (BID) | ORAL | 0 refills | Status: DC

## 2019-12-03 MED ORDER — LEVOTHYROXINE SODIUM 88 MCG PO TABS: 88.0000 ug | ORAL_TABLET | Freq: Every day | ORAL | Status: AC

## 2019-12-03 NOTE — Plan of Care (Signed)
  Problem: Pain Managment: Goal: General experience of comfort will improve Outcome: Progressing   Problem: Clinical Measurements: Goal: Respiratory complications will improve Outcome: Progressing   Problem: Activity: Goal: Risk for activity intolerance will decrease Outcome: Progressing   Problem: Nutrition: Goal: Adequate nutrition will be maintained Outcome: Progressing   Problem: Pain Managment: Goal: General experience of comfort will improve Outcome: Progressing

## 2019-12-03 NOTE — Discharge Summary (Signed)
Physician Discharge Summary  Melinda Forbes EGB:[id number] DOB: 1965-08-11 DOA: 11/30/2019  PCP: Melinda Brunette, MD  Admit date: 11/30/2019 Discharge date: 12/03/2019  Recommendations for Outpatient Follow-up:  1. Discharge to home. 2. Follow up with PCP in 7-10 days. 3. Follow up with Dr. Michaell Forbes as directed.   Follow-up Information    Melinda Soda, MD Follow up in 1 month(s).   Specialty: General Surgery Contact information: [address] [place] [address] [phone]                Discharge Diagnoses: Principal diagnosis is #1 1. Proctocolitis with small perforation/abscess. 2. Hypothyroidism 3. GERD  Discharge Condition: Fair  Disposition: Home  Diet recommendation: Soft  Filed Weights   12/01/19 0043  Weight: 47.6 kg    History of present illness:  54 yo female with abdominal pain and inflammation of sigmoid colon consistent with diverticulitis vs IBD. She is feeling much better -continue abx -advance to soft diet -if tolerates diet can discharge home with 10 days of PO abx (Rx written) -f/u with Gross in office  Hospital Course:  Gen surg consulted, hospitalist asked to admit.  Pt started on rocephin / flagyl. Now changed to Zosyn by general surgery.  The patient has been seen by Dr. Michaell Forbes of general surgery. He has recommended bowel rest for now and possible advancing of diet tomorrow if she is feeling better. She is receiving IV antibiotics. This has been changed to Zosyn by surgery. She has been advanced to a clear liquid diet by general surgery, and she will ambulate.  Her diet has been advanced to a soft diet. She has been cleared for discharge by general surgery.  Today's assessment: S: The patient is resting comfortably. She has tolerated her soft diet and wants to go home. O: Vitals:  Vitals:   12/02/19 2159 12/03/19 0558  BP: 125/80 117/71  Pulse: (!) 54 (!) 56  Resp: 20 16  Temp: 99 F (37.2 C) 98.1 F (36.7  C)  SpO2: 97% 97%   Exam:  Constitutional:  . The patient is awake, alert, and oriented x 3. No acute distress. Respiratory:  . No increased work of breathing. . No wheezes, rales, or rhonchi . No tactile fremitus Cardiovascular:  . Regular rate and rhythm . No murmurs, ectopy, or gallups. . No lateral PMI. No thrills. Abdomen:  . Abdomen is soft, non-tender, non-distended . No hernias, masses, or organomegaly . Normoactive bowel sounds.  Musculoskeletal:  . No cyanosis, clubbing, or edema Skin:  . No rashes, lesions, ulcers . palpation of skin: no induration or nodules Neurologic:  . CN 2-12 intact . Sensation all 4 extremities intact Psychiatric:  . Mental status o Mood, affect appropriate o Orientation to person, place, time  . judgment and insight appear intact  Discharge Instructions  Discharge Instructions    Activity as tolerated - No restrictions   Complete by: As directed    Call MD for:  persistant nausea and vomiting   Complete by: As directed    Call MD for:  severe uncontrolled pain   Complete by: As directed    Call MD for:  temperature >100.4   Complete by: As directed    Diet - low sodium heart healthy   Complete by: As directed    Discharge instructions   Complete by: As directed    Discharge to home. Follow up with PCP in 7-10 days. Follow up with Dr. Michaell Forbes as directed.   Increase  activity slowly   Complete by: As directed      Allergies as of 12/03/2019      Reactions   Other    NARCOTICS--PROJECTILE VOMITING      Medication List    STOP taking these medications   Cranberry 125 MG Tabs   naproxen sodium 220 MG tablet Commonly known as: ALEVE     TAKE these medications   amoxicillin-clavulanate 875-125 MG tablet Commonly known as: Augmentin Take 1 tablet by mouth every 12 (twelve) hours.   CALCIUM 1200 PO Take 1 tablet by mouth at bedtime.   hydrocortisone 2.5 % rectal cream Commonly known as: ANUSOL-HC Apply 1  application topically 4 (four) times daily as needed for hemorrhoids.   levothyroxine 88 MCG tablet Commonly known as: SYNTHROID Take 1 tablet (88 mcg total) by mouth daily before breakfast. Start taking on: December 04, 2019 What changed:   medication strength  how much to take   omeprazole 20 MG capsule Commonly known as: PRILOSEC TAKE 1 CAPSULE (20 MG TOTAL) BY MOUTH 2 (TWO) TIMES DAILY. What changed: See the new instructions.      Allergies  Allergen Reactions  . Other     NARCOTICS--PROJECTILE VOMITING    The results of significant diagnostics from this hospitalization (including imaging, microbiology, ancillary and laboratory) are listed below for reference.    Significant Diagnostic Studies: CT ABDOMEN PELVIS W CONTRAST  Result Date: 11/30/2019 CLINICAL DATA:  Lower abdominal pain EXAM: CT ABDOMEN AND PELVIS WITH CONTRAST TECHNIQUE: Multidetector CT imaging of the abdomen and pelvis was performed using the standard protocol following bolus administration of intravenous contrast. CONTRAST:  OMNIPAQUE IOHEXOL 300 MG/ML  SOLN COMPARISON:  None. FINDINGS: Lower chest: Lung bases demonstrate no acute consolidation or pleural effusion. Trace pericardial effusion. Hepatobiliary: Hepatic cysts. Subcentimeter hypodense liver lesions too small to further characterize. No calcified gallstone. No biliary dilatation Pancreas: Unremarkable. No pancreatic ductal dilatation or surrounding inflammatory changes. Spleen: Normal in size without focal abnormality. Adrenals/Urinary Tract: Adrenal glands are unremarkable. Kidneys are normal, without renal calculi, focal lesion, or hydronephrosis. Bladder is unremarkable. Stomach/Bowel: The stomach is nonenlarged. No dilated small bowel. Negative appendix. Wall thickening of the rectosigmoid colon with moderate surrounding inflammatory change. Small inflammatory gas and fluid collection measuring 12 mm. Vascular/Lymphatic: Nonaneurysmal aorta.  No  suspicious nodes. Reproductive: Uterus and bilateral adnexa are unremarkable. Other: No free air. Presacral and perirectal edema. Small fat containing umbilical hernia Musculoskeletal: No acute or significant osseous findings. IMPRESSION: 1. Wall thickening of the rectosigmoid colon with moderate surrounding inflammatory change, suspicious for proctocolitis of infectious, inflammatory, or ischemic etiology. 12 mm inflammatory gas and fluid collection adjacent to the distal rectosigmoid colon, indeterminate for contained perforation or small intramural abscess. There is no free air identified. 2. Trace pericardial effusion. These results will be called to the ordering clinician or representative by the Radiologist Assistant, and communication documented in the PACS or [employer]. Electronically Signed   By: Jasmine Pang M.D.   On: 11/30/2019 19:16    Microbiology: Recent Results (from the past 240 hour(s))  SARS Coronavirus 2 by RT PCR (hospital order, performed in [facility] hospital lab) Nasopharyngeal Nasopharyngeal Swab     Status: None   Collection Time: 11/30/19 10:00 PM   Specimen: Nasopharyngeal Swab  Result Value Ref Range Status   SARS Coronavirus 2 NEGATIVE NEGATIVE Final    Comment: (NOTE) SARS-CoV-2 target nucleic acids are NOT DETECTED.  The SARS-CoV-2 RNA is generally detectable in upper and  lower respiratory specimens during the acute phase of infection. The lowest concentration of SARS-CoV-2 viral copies this assay can detect is 250 copies / mL. A negative result does not preclude SARS-CoV-2 infection and should not be used as the sole basis for treatment or other patient management decisions.  A negative result may occur with improper specimen collection / handling, submission of specimen other than nasopharyngeal swab, presence of viral mutation(s) within the areas targeted by this assay, and inadequate number of viral copies (<250 copies / mL). A negative result must  be combined with clinical observations, patient history, and epidemiological information.  Fact Sheet for Patients:   BoilerBrush.com.cy  Fact Sheet for Healthcare Providers: https://pope.com/  This test is not yet approved or  cleared by the [place] FDA and has been authorized for detection and/or diagnosis of SARS-CoV-2 by FDA under an Emergency Use Authorization (EUA).  This EUA will remain in effect (meaning this test can be used) for the duration of the COVID-19 declaration under Section 564(b)(1) of the Act, 21 U.S.C. section 360bbb-3(b)(1), unless the authorization is terminated or revoked sooner.  Performed at [facility], [address] [place], [place] [address]      Labs: Basic Metabolic Panel: Recent Labs  Lab 11/30/19 2030 12/01/19 0236 12/02/19 0306 12/03/19 0300  NA 140 140 140 140  K 3.6 3.4* 3.7 3.3*  CL 103 107 106 106  CO2 27 24 24 25   GLUCOSE 102* 101* 82 103*  BUN 10 8 7 6   CREATININE 0.68 0.59 0.65 0.68  CALCIUM 8.8* 8.0* 8.2* 8.2*  MG  --  2.2 2.1  --   PHOS  --  3.0  --   --    Liver Function Tests: Recent Labs  Lab 11/30/19 2030 12/01/19 0236 12/03/19 0300  AST 15 13* 12*  ALT 10 9 7   ALKPHOS 60 48 41  BILITOT 0.6 0.6 0.3  PROT 7.0 5.8* 5.7*  ALBUMIN 3.8 3.2* 3.1*   Recent Labs  Lab 11/30/19 2030  LIPASE 31   No results for input(s): AMMONIA in the last 168 hours. CBC: Recent Labs  Lab 11/30/19 2030 12/01/19 0236 12/02/19 0306 12/03/19 0300  WBC 7.9 7.1 6.6 7.5  NEUTROABS 4.1  --   --  3.8  HGB 11.8* 10.7* 11.2* 11.3*  HCT 36.2 32.4* 34.7* 33.6*  MCV 89.6 88.5 90.4 87.5  PLT 269 224 218 242   Cardiac Enzymes: No results for input(s): CKTOTAL, CKMB, CKMBINDEX, TROPONINI in the last 168 hours. BNP: BNP (last 3 results) No results for input(s): BNP in the last 8760 hours.  ProBNP (last 3 results) No results for input(s): PROBNP in the last  8760 hours.  CBG: No results for input(s): GLUCAP in the last 168 hours.  Principal Problem:   Proctocolitis with abscess Active Problems:   History of recurrent UTIs   Hypothyroidism   GERD (gastroesophageal reflux disease)   Hypokalemia   Abscess   Time coordinating discharge: 38 minutes.  Signed:        Wonder Donaway, DO [facility]  12/03/2019, 1:20 PM

## 2019-12-03 NOTE — Progress Notes (Signed)
Progress Note: General Surgery Service   Chief Complaint/Subjective: Pain resolved, tolerating diet, 2 semi formed BMs  Objective: Vital signs in last 24 hours: Temp:  [98.1 F (36.7 C)-99 F (37.2 C)] 98.1 F (36.7 C) (08/22 0558) Pulse Rate:  [54-56] 56 (08/22 0558) Resp:  [16-20] 16 (08/22 0558) BP: (107-125)/(61-80) 117/71 (08/22 0558) SpO2:  [97 %-98 %] 97 % (08/22 0558) Last BM Date: 12/03/19  Intake/Output from previous day: 08/21 0701 - 08/22 0700 In: 2432.7 [P.O.:1360; I.V.:901.3; IV Piggyback:171.4] Out: 2050 [Urine:2050] Intake/Output this shift: No intake/output data recorded.  Gen: NAD  Resp: nonlabored  Card: bradycardic  Abd: soft, NT, ND  Lab Results: CBC  Recent Labs    12/02/19 0306 12/03/19 0300  WBC 6.6 7.5  HGB 11.2* 11.3*  HCT 34.7* 33.6*  PLT 218 242   BMET Recent Labs    12/02/19 0306 12/03/19 0300  NA 140 140  K 3.7 3.3*  CL 106 106  CO2 24 25  GLUCOSE 82 103*  BUN 7 6  CREATININE 0.65 0.68  CALCIUM 8.2* 8.2*   PT/INR No results for input(s): LABPROT, INR in the last 72 hours. ABG No results for input(s): PHART, HCO3 in the last 72 hours.  Invalid input(s): PCO2, PO2  Anti-infectives: Anti-infectives (From admission, onward)   Start     Dose/Rate Route Frequency Ordered Stop   12/01/19 2100  cefTRIAXone (ROCEPHIN) 1 g in sodium chloride 0.9 % 100 mL IVPB  Status:  Discontinued        1 g 200 mL/hr over 30 Minutes Intravenous Every 24 hours 11/30/19 2325 11/30/19 2327   12/01/19 2100  cefTRIAXone (ROCEPHIN) 2 g in sodium chloride 0.9 % 100 mL IVPB  Status:  Discontinued        2 g 200 mL/hr over 30 Minutes Intravenous Every 24 hours 11/30/19 2327 12/01/19 0824   12/01/19 1000  piperacillin-tazobactam (ZOSYN) IVPB 3.375 g        3.375 g 12.5 mL/hr over 240 Minutes Intravenous Every 8 hours 12/01/19 0824     12/01/19 0600  metroNIDAZOLE (FLAGYL) IVPB 500 mg  Status:  Discontinued        500 mg 100 mL/hr over 60  Minutes Intravenous Every 8 hours 11/30/19 2325 12/01/19 0824   11/30/19 2115  cefTRIAXone (ROCEPHIN) 1 g in sodium chloride 0.9 % 100 mL IVPB        1 g 200 mL/hr over 30 Minutes Intravenous  Once 11/30/19 2110 11/30/19 2205   11/30/19 2115  metroNIDAZOLE (FLAGYL) IVPB 500 mg        500 mg 100 mL/hr over 60 Minutes Intravenous  Once 11/30/19 2110 11/30/19 2320      Medications: Scheduled Meds: . enoxaparin (LOVENOX) injection  40 mg Subcutaneous Q24H  . levothyroxine  88 mcg Oral QAC breakfast  . lip balm  1 application Topical BID  . pantoprazole  40 mg Oral QHS   Continuous Infusions: . lactated ringers    . lactated ringers 75 mL/hr at 12/03/19 0600  . methocarbamol (ROBAXIN) IV    . ondansetron (ZOFRAN) IV    . piperacillin-tazobactam (ZOSYN)  IV 12.5 mL/hr at 12/03/19 0600   PRN Meds:.acetaminophen, alum & mag hydroxide-simeth, guaiFENesin-dextromethorphan, hydrocortisone, hydrocortisone cream, HYDROmorphone (DILAUDID) injection, lactated ringers, magic mouthwash, menthol-cetylpyridinium, methocarbamol (ROBAXIN) IV, morphine injection, ondansetron (ZOFRAN) IV **OR** ondansetron (ZOFRAN) IV, ondansetron **OR** [DISCONTINUED] ondansetron (ZOFRAN) IV, oxyCODONE, phenol, prochlorperazine  Assessment/Plan: 54 yo female with abdominal pain and inflammation of sigmoid colon consistent with  diverticulitis vs IBD. She is feeling much better -continue abx -advance to soft diet -if tolerates diet can discharge home with 10 days of PO abx (Rx written) -f/u with Gross in office     LOS: 2 days   Rodman Pickle, MD [phone] [facility]

## 2019-12-03 NOTE — Progress Notes (Signed)
Pt stable at time of d/c instructions and education. No change from am assessment. Pt awaiting ride and will d/c today. Rn will continue to monitor.

## 2019-12-03 NOTE — Plan of Care (Signed)
Pt stable with no needs at this time. Pt to d/c home today. Pt tolerating soft diet well with no complications. Pt also ambulating  in [place] and tolerating it well.

## 2019-12-05 ENCOUNTER — Other Ambulatory Visit: Payer: Self-pay | Admitting: Family Medicine

## 2019-12-05 DIAGNOSIS — K630 Abscess of intestine: Secondary | ICD-10-CM

## 2019-12-07 ENCOUNTER — Other Ambulatory Visit (HOSPITAL_BASED_OUTPATIENT_CLINIC_OR_DEPARTMENT_OTHER): Payer: Self-pay | Admitting: Family Medicine

## 2019-12-07 DIAGNOSIS — K630 Abscess of intestine: Secondary | ICD-10-CM

## 2019-12-19 ENCOUNTER — Other Ambulatory Visit: Payer: Self-pay

## 2019-12-19 ENCOUNTER — Ambulatory Visit (HOSPITAL_BASED_OUTPATIENT_CLINIC_OR_DEPARTMENT_OTHER)
Admission: RE | Admit: 2019-12-19 | Discharge: 2019-12-19 | Disposition: A | Discharge status: Home / Self Care | Payer: Managed Care, Other (non HMO) | Source: Ambulatory Visit | Attending: Family Medicine | Admitting: Family Medicine

## 2019-12-19 DIAGNOSIS — K630 Abscess of intestine: Secondary | ICD-10-CM

## 2019-12-19 MED ORDER — IOHEXOL 300 MG/ML  SOLN
100.0000 mL | Freq: Once | INTRAMUSCULAR | Status: AC | PRN
  Administered 2019-12-19: 100 mL via INTRAVENOUS

## 2020-01-12 HISTORY — PX: OTHER SURGICAL HISTORY: SHX169

## 2020-03-04 DIAGNOSIS — U071 COVID-19: Secondary | ICD-10-CM

## 2020-03-04 HISTORY — DX: COVID-19: U07.1

## 2020-03-11 ENCOUNTER — Other Ambulatory Visit: Payer: Self-pay | Admitting: Physician Assistant

## 2020-03-11 ENCOUNTER — Encounter: Payer: Self-pay | Admitting: Physician Assistant

## 2020-03-11 ENCOUNTER — Ambulatory Visit (HOSPITAL_COMMUNITY)
Admission: RE | Admit: 2020-03-11 | Discharge: 2020-03-11 | Disposition: A | Discharge status: Home / Self Care | Payer: Managed Care, Other (non HMO) | Source: Ambulatory Visit | Attending: Pulmonary Disease | Admitting: Pulmonary Disease

## 2020-03-11 DIAGNOSIS — Z8709 Personal history of other diseases of the respiratory system: Secondary | ICD-10-CM | POA: Insufficient documentation

## 2020-03-11 DIAGNOSIS — U071 COVID-19: Secondary | ICD-10-CM

## 2020-03-11 DIAGNOSIS — L0291 Cutaneous abscess, unspecified: Secondary | ICD-10-CM

## 2020-03-11 DIAGNOSIS — R0989 Other specified symptoms and signs involving the circulatory and respiratory systems: Secondary | ICD-10-CM | POA: Insufficient documentation

## 2020-03-11 DIAGNOSIS — R030 Elevated blood-pressure reading, without diagnosis of hypertension: Secondary | ICD-10-CM

## 2020-03-11 MED ORDER — METHYLPREDNISOLONE SODIUM SUCC 125 MG IJ SOLR: 125.0000 mg | Freq: Once | INTRAMUSCULAR | Status: DC | PRN

## 2020-03-11 MED ORDER — SODIUM CHLORIDE 0.9 % IV SOLN: INTRAVENOUS | Status: DC | PRN

## 2020-03-11 MED ORDER — ALBUTEROL SULFATE HFA 108 (90 BASE) MCG/ACT IN AERS: 2.0000 | INHALATION_SPRAY | Freq: Once | RESPIRATORY_TRACT | Status: DC | PRN

## 2020-03-11 MED ORDER — SODIUM CHLORIDE 0.9 % IV BOLUS
1000.0000 mL | Freq: Once | INTRAVENOUS | Status: AC
  Administered 2020-03-11: 1000 mL via INTRAVENOUS

## 2020-03-11 MED ORDER — ONDANSETRON HCL 4 MG/2ML IJ SOLN
4.0000 mg | Freq: Once | INTRAMUSCULAR | Status: AC
  Administered 2020-03-11: 4 mg via INTRAVENOUS
  Filled 2020-03-11: qty 2

## 2020-03-11 MED ORDER — FAMOTIDINE IN NACL 20-0.9 MG/50ML-% IV SOLN: 20.0000 mg | Freq: Once | INTRAVENOUS | Status: DC | PRN

## 2020-03-11 MED ORDER — DIPHENHYDRAMINE HCL 50 MG/ML IJ SOLN: 50.0000 mg | Freq: Once | INTRAMUSCULAR | Status: DC | PRN

## 2020-03-11 MED ORDER — SOTROVIMAB 500 MG/8ML IV SOLN
500.0000 mg | Freq: Once | INTRAVENOUS | Status: AC
  Administered 2020-03-11: 500 mg via INTRAVENOUS

## 2020-03-11 MED ORDER — EPINEPHRINE 0.3 MG/0.3ML IJ SOAJ: 0.3000 mg | Freq: Once | INTRAMUSCULAR | Status: DC | PRN

## 2020-03-11 NOTE — Progress Notes (Signed)
I connected by phone with Melinda Forbes on 03/11/2020 at 10:34 AM to discuss the potential use of a new treatment for mild to moderate COVID-19 viral infection in non-hospitalized patients.  This patient is a 54 y.o. female that meets the FDA criteria for Emergency Use Authorization of COVID monoclonal antibody sotrovimab, casirivimab/imdevimab or bamlamivimab/estevimab.  Has a (+) direct SARS-CoV-2 viral test result  Has mild or moderate COVID-19   Is NOT hospitalized due to COVID-19  Is within 10 days of symptom onset  Has at least one of the high risk factor(s) for progression to severe COVID-19 and/or hospitalization as defined in EUA.  Specific high risk criteria : Cardiovascular disease or hypertension, Chronic Lung Disease and Other high risk medical condition per CDC:  recent colonic abcess   I have spoken and communicated the following to the patient or parent/caregiver regarding COVID monoclonal antibody treatment:  1. FDA has authorized the emergency use for the treatment of mild to moderate COVID-19 in adults and pediatric patients with positive results of direct SARS-CoV-2 viral testing who are 20 years of age and older weighing at least 40 kg, and who are at high risk for progressing to severe COVID-19 and/or hospitalization.  2. The significant known and potential risks and benefits of COVID monoclonal antibody, and the extent to which such potential risks and benefits are unknown.  3. Information on available alternative treatments and the risks and benefits of those alternatives, including clinical trials.  4. Patients treated with COVID monoclonal antibody should continue to self-isolate and use infection control measures (e.g., wear mask, isolate, social distance, avoid sharing personal items, clean and disinfect "high touch" surfaces, and frequent handwashing) according to CDC guidelines.   5. The patient or parent/caregiver has the option to accept or refuse COVID  monoclonal antibody treatment.  After reviewing this information with the patient, the patient has agreed to receive one of the available covid 19 monoclonal antibodies and will be provided an appropriate fact sheet prior to infusion.  Sx onset 11/21. Set up for infusion on 11/29 @ 4:30pm. Directions given to [facility]. Pt is aware that insurance will be charged an infusion fee. Pt is fully vaccinated. Tested + by home test and by PCR at [facility].   Cline Crock 03/11/2020 10:34 AM

## 2020-03-11 NOTE — Discharge Instructions (Signed)
10 Things You Can Do to Manage Your COVID-19 Symptoms at Home If you have possible or confirmed COVID-19: 1. Stay home from work and school. And stay away from other public places. If you must go out, avoid using any kind of public transportation, ridesharing, or taxis. 2. Monitor your symptoms carefully. If your symptoms get worse, call your healthcare provider immediately. 3. Get rest and stay hydrated. 4. If you have a medical appointment, call the healthcare provider ahead of time and tell them that you have or may have COVID-19. 5. For medical emergencies, call 911 and notify the dispatch personnel that you have or may have COVID-19. 6. Cover your cough and sneezes with a tissue or use the inside of your elbow. 7. Wash your hands often with soap and water for at least 20 seconds or clean your hands with an alcohol-based hand sanitizer that contains at least 60% alcohol. 8. As much as possible, stay in a specific room and away from other people in your home. Also, you should use a separate bathroom, if available. If you need to be around other people in or outside of the home, wear a mask. 9. Avoid sharing personal items with other people in your household, like dishes, towels, and bedding. 10. Clean all surfaces that are touched often, like counters, tabletops, and doorknobs. Use household cleaning sprays or wipes according to the label instructions. cdc.gov/coronavirus 10/12/2018 This information is not intended to replace advice given to you by your health care provider. Make sure you discuss any questions you have with your health care provider. Document Revised: 03/16/2019 Document Reviewed: 03/16/2019 Elsevier Patient Education  2020 Elsevier Inc.  What types of side effects do monoclonal antibody drugs cause?  Common side effects  In general, the more common side effects caused by monoclonal antibody drugs include: . Allergic reactions, such as hives or itching . Flu-like signs and  symptoms, including chills, fatigue, fever, and muscle aches and pains . Nausea, vomiting . Diarrhea . Skin rashes . Low blood pressure   The CDC is recommending patients who receive monoclonal antibody treatments wait at least 90 days before being vaccinated.  Currently, there are no data on the safety and efficacy of mRNA COVID-19 vaccines in persons who received monoclonal antibodies or convalescent plasma as part of COVID-19 treatment. Based on the estimated half-life of such therapies as well as evidence suggesting that reinfection is uncommon in the 90 days after initial infection, vaccination should be deferred for at least 90 days, as a precautionary measure until additional information becomes available, to avoid interference of the antibody treatment with vaccine-induced immune responses.  If you have any questions or concerns after the infusion please call the Advanced Practice Provider on call at [phone]. This number is only intended for your use regarding questions or concerns about the infusion post-treatment side-effects.  Please do not provide this number to others for use.   If someone you know is interested in receiving treatment please have them call the COVID hotline at [phone].   

## 2020-03-11 NOTE — Progress Notes (Signed)
Patient reviewed Fact Sheet for Patients, Parents, and Caregivers for Emergency Use Authorization (EUA) of Sotrovimab for the Treatment of Coronavirus. Patient also reviewed and is agreeable to the estimated cost of treatment. Patient is agreeable to proceed.   

## 2020-03-11 NOTE — Progress Notes (Signed)
°  Diagnosis: COVID-19 ° °Physician:dr wright ° °Procedure: Diagnosis: COVID-19 ° °Physician: Dr. Patrick Wright ° °Procedure: Covid Infusion Clinic Med: Sotrovimab infusion - Provided patient with sotrovimab fact sheet for patients, parents, and caregivers prior to infusion.  ° °Complications: No immediate complications noted ° °Discharge: Discharged home ° ° ° °Complications: No immediate complications noted. ° °Discharge: Discharged home  ° °Melinda Forbes °03/11/2020 ° °

## 2020-03-18 ENCOUNTER — Other Ambulatory Visit: Payer: Self-pay | Admitting: Family Medicine

## 2020-03-18 DIAGNOSIS — Z1231 Encounter for screening mammogram for malignant neoplasm of breast: Secondary | ICD-10-CM

## 2020-04-01 ENCOUNTER — Other Ambulatory Visit: Payer: Self-pay

## 2020-04-01 ENCOUNTER — Ambulatory Visit
Admission: RE | Admit: 2020-04-01 | Discharge: 2020-04-01 | Disposition: A | Discharge status: Home / Self Care | Payer: Managed Care, Other (non HMO) | Source: Ambulatory Visit

## 2020-04-01 DIAGNOSIS — Z1231 Encounter for screening mammogram for malignant neoplasm of breast: Secondary | ICD-10-CM

## 2020-08-01 ENCOUNTER — Other Ambulatory Visit: Payer: Self-pay | Admitting: Obstetrics and Gynecology

## 2020-08-08 ENCOUNTER — Encounter: Payer: Self-pay | Admitting: Cardiology

## 2020-08-08 ENCOUNTER — Ambulatory Visit: Payer: Managed Care, Other (non HMO) | Admitting: Cardiology

## 2020-08-08 ENCOUNTER — Other Ambulatory Visit: Payer: Self-pay

## 2020-08-08 VITALS — BP 115/74 | HR 63 | Temp 98.6°F | Ht 63.0 in | Wt 124.0 lb

## 2020-08-08 DIAGNOSIS — I3139 Other pericardial effusion (noninflammatory): Secondary | ICD-10-CM | POA: Insufficient documentation

## 2020-08-08 DIAGNOSIS — I313 Pericardial effusion (noninflammatory): Secondary | ICD-10-CM | POA: Insufficient documentation

## 2020-08-08 DIAGNOSIS — R0789 Other chest pain: Secondary | ICD-10-CM

## 2020-08-08 NOTE — Progress Notes (Signed)
Patient referred by Carol Ada, MD for pericardial effusion  Subjective:   Melinda Forbes, female    DOB: December 07, 1965, 55 y.o.   MRN: [id number]   Chief Complaint  Patient presents with  . Pericardial effusioin  . Coronary Artery Disease     HPI  55 y.o. Caucsian female with Hashimoto thyroiditis, pericardial effusion.  Patient works as a Civil engineer, contracting, has no known CAD, is non-smoker, exercises regularly without any symptoms. Patient has had recurrent unexplained contained colonic peroration 2-3 times in last few months. CT scan with each event has shows small pericardial effusion. She denies any orthopnea, PND, leg edema. She has occasional episodes of chest pain or shortness of breath, unrelated to exertion, usually when she is under stress.   Patient is now scheduled to undergo laparoscopic surgery on May 12.   Past Medical History:  Diagnosis Date  . Chronic throat clearing    ENT eval 2008 by Dr. Lucia Gaskins showed normal vocal cords and " slight edema of the arytenoid mucosa possibly indicative of globus type sx's and possible reflux symptoms".  . Elevated blood pressure reading   . GERD (gastroesophageal reflux disease)    omeprazole daily helps  . History of bronchitis   . Hypothyroidism    managed by endocrinologist (Dr. Chalmers Cater)  . Recurrent UTI    no hx of pyelonephritis     Past Surgical History:  Procedure Laterality Date  . [place]   first pregnancy.  Her second delivery was vaginal.  . CYST EXCISION  1999   Epidermal inclusion cyst in right groin (Dr. Excell Seltzer)  . GROIN DEBRIDEMENT       Social History   Tobacco Use  Smoking Status Never Smoker  Smokeless Tobacco Never Used    Social History   Substance and Sexual Activity  Alcohol Use No     Family History  Problem Relation Age of Onset  . Diabetes Mother   . Breast cancer Mother   . Heart disease Father        first MI age 54  . Hypertension Father   . Cancer  Father        "cancer of the bone marrow"  . Cancer Maternal Grandmother        died of breast cancer age 41  . Breast cancer Maternal Grandmother      Current Outpatient Medications on File Prior to Visit  Medication Sig Dispense Refill  . amoxicillin-clavulanate (AUGMENTIN) 875-125 MG tablet Take 1 tablet by mouth every 12 (twelve) hours. 20 tablet 0  . Calcium Carbonate-Vit D-Min (CALCIUM 1200 PO) Take 1 tablet by mouth at bedtime.     . hydrocortisone (ANUSOL-HC) 2.5 % rectal cream Apply 1 application topically 4 (four) times daily as needed for hemorrhoids. 30 g 0  . levothyroxine (SYNTHROID) 88 MCG tablet Take 1 tablet (88 mcg total) by mouth daily before breakfast.    . omeprazole (PRILOSEC) 20 MG capsule TAKE 1 CAPSULE (20 MG TOTAL) BY MOUTH 2 (TWO) TIMES DAILY. (Patient taking differently: Take 20 mg by mouth at bedtime. ) 60 capsule 0   No current facility-administered medications on file prior to visit.    Cardiovascular and other pertinent studies:  EKG 08/08/2020: Sinus rhythm 56 bpm Normal EKG  CT abdomen 12/19/2019: 1. Persistent but improved wall thickening with inflammatory stranding about the rectosigmoid colon, consistent with improved colitis. Previously identified contained perforation is more well-defined with decreased inflammatory changes as compared to prior exam.  2. No other new acute intra-abdominal or pelvic process. 3. Small to moderate pericardial effusion. 4. Prominence of the left pelvic vasculature and left gonadal vein, which can be seen the setting of pelvic congestion syndrome. No visible occlusive thrombus.   Recent labs: 12/02/2019: Glucose 84, BUN/Cr 11/0.6. EGFR 103. Na/K 140/4.5.  H/H 12.6/36.8. MCV 87. Platelets 274 HbA1C N/A Lipid panel N/A TSH 2.6 normal    Review of Systems  Cardiovascular: Positive for chest pain. Negative for dyspnea on exertion, leg swelling, palpitations and syncope.  Respiratory: Positive for shortness of  breath.          Vitals:   08/08/20 1505  BP: 115/74  Pulse: 63  Temp: 98.6 F (37 C)  SpO2: 98%     Body mass index is 21.97 kg/m. Filed Weights   08/08/20 1505  Weight: 124 lb (56.2 kg)     Objective:   Physical Exam Vitals and nursing note reviewed.  Constitutional:      General: She is not in acute distress. Neck:     Vascular: No JVD.  Cardiovascular:     Rate and Rhythm: Normal rate and regular rhythm.     Heart sounds: Normal heart sounds. No murmur heard.   Pulmonary:     Effort: Pulmonary effort is normal.     Breath sounds: Normal breath sounds. No wheezing or rales.  Musculoskeletal:     Right lower leg: No edema.     Left lower leg: No edema.         Assessment & Recommendations:    55 y.o. Caucsian female with Hashimoto thyroiditis, pericardial effusion, atypical chest pain  Pericardial effusion: Suspect may have been related to inflammatory reaction during episodes of contained colonic perforation. Clinically, I do not think the effusion is large. Will obtain echocardiogram.  Atypical chest pain: Unlikely to be angina.  Will obtain exercise treadmill stress test for risk stratification prior to upcoming laparoscopic surgery   Thank you for referring the patient to [place]. Please feel free to contact with any questions.   Nigel Mormon, MD Pager: [phone] Office: [phone]

## 2020-08-13 ENCOUNTER — Ambulatory Visit: Payer: Managed Care, Other (non HMO)

## 2020-08-13 ENCOUNTER — Other Ambulatory Visit: Payer: Self-pay

## 2020-08-13 DIAGNOSIS — I313 Pericardial effusion (noninflammatory): Secondary | ICD-10-CM

## 2020-08-13 DIAGNOSIS — I3139 Other pericardial effusion (noninflammatory): Secondary | ICD-10-CM

## 2020-08-16 ENCOUNTER — Encounter (HOSPITAL_BASED_OUTPATIENT_CLINIC_OR_DEPARTMENT_OTHER): Payer: Self-pay | Admitting: Obstetrics and Gynecology

## 2020-08-16 ENCOUNTER — Other Ambulatory Visit: Payer: Self-pay

## 2020-08-16 NOTE — Progress Notes (Signed)
Spoke w/ via phone for pre-op interview--pt- Lab needs dos---- ask dr rivard if urine preg needed, pt postmenopausal and dr rivard ordered urine preg               Lab results------has lab appt 08-19-2020 845 am for cbc bmp,  also see below COVID test ------08-21-2020 1200 pm Arrive at ------645 am 08-22-2020- NPO after MN NO Solid Food.   Drink ensure presurgery drink at 545 am, Clear liquids from MN until---545 am then npo Med rec completed Medications to take morning of surgery -----levothyroxine, restasiis  eye drop Diabetic medication -----n/a Patient instructed to bring photo id and insurance card day of surgery Patient aware to have Driver (ride ) / caregiver spouse todd Harmon    for 24 hours after surgery  Patient Special Instructions -----follow all bowel prep instrctions from dr rivard office  Pre-Op special Istructions -----none Patient verbalized understanding of instructions that were given at this phone interview. Patient denies shortness of breath, chest pain, fever, cough at this phone interview.  lov dr Shanon Ace 08-08-2020 epic/chart, echo 08-13-2020/cardiac clearance dr Shanon Ace note on chart/epic ekg 08-08-2020 epic

## 2020-08-16 NOTE — H&P (Signed)
Melinda Forbes is a 55 y.o. 55 YO female, P: 3-0-0-3 presents for operative laparoscopy because of chronic left sided pelvic pain.  The patient gives a history of left lower quadrant sharp, fleeting pelvic pain that has been present,  since her C-section in 1988.  Until recently  however, the pain would only occur with sneezing, laughing or coughing.  In August 2021 the patient was diagnosed with peri-colitis with abscess and was told that she had a "contained" tear in her colon.  She was managed medically  with antibiotics and recovered,  however, since that time she has had a daily dull ache in the same left lower quadrant that intensifies with cough, sneeze or laugh. In February 2022 the patient had a recurrence of her bowel symptoms that was again managed with antibiotics.  She continues to complain of that pain.  She denies changes in bowel or bladder function, dyspareunia, or  vaginitis symptoms.  She states that her colonoscopy during this time returned normal though evaluation by a general surgeon at [facility] noted that she had significant left pelvic congestion based on 01/02/2020 CT scan of abdomen and pelvis.  Her STD testing has all been negative and given the nature, location and history of her pain the patient wishes to proceed with surgical Gyn evaluation .   Past Medical History  OB History: G: 3; P: 3-0-0-3; 1988-C-section; SVB: 1990 and 1993  GYN History: menarche: 55 YO;   LMP: menopausal;  Denies history of abnormal PAP smear.   Last PAP smear: 2019-normal  Medical History: Hashimoto's Thyroiditis, GERD, Pericardial Effusion (seen by cardiologist with no treatment needed), Atypical Chest pain, Left Hip Bursitis and Pericolitis with Abscess  Surgical History: 1988 C-section; 1999 Right Groin Inclusion Cyst Excision and Debridement and 2019 Blepharoplasty (bilateral)  Denies problems with anesthesia or history of blood transfusions  Family History: Cardiac  Disease, Breast Cancer, Diabetes Mellitus, Cushing's  Disease  Social History: Married and employed as a Immunologist:  Denies tobacco use and occasionally consumes alcohol  Medications: Levothyroxine 100 mcg daily Fiber daily Esomeprazole 20 mg qhs Cranberry tablet daily Calcium daily  Allergies  Allergen Reactions  . Codeine     Other reaction(s): intense vomiting, Other (See Comments)  . Morphine Sulfate Other (See Comments)    Other reaction(s): intense vomiting  . Other     NARCOTICS--PROJECTILE VOMITING Other reaction(s): Other (See Comments) NARCOTICS--PROJECTILE VOMITING   Morphine also causes itching;  may take Tramadol   ROS: Admits to glasses/contact lenses, left hip pain  Denies headache, vision changes, nasal congestion, dysphagia, tinnitus, dizziness, hoarseness, cough,  chest pain, shortness of breath, nausea, vomiting, diarrhea,constipation,  urinary frequency, urgency  dysuria, hematuria, vaginitis symptoms, pelvic pain, swelling of joints,easy bruising,  myalgias, arthralgias, skin rashes, unexplained weight loss and except as is mentioned in the history of present illness, patient's review of systems is otherwise negative.     Physical Exam  Bp: 114/62; P: 59 bpm;  R: 16;  Temperature: 98.7 degrees F orally; Weight: 122 lbs.; Height: 5'5.5";  BMI: 21.3;  O2Sat.: 99% (room air)  Neck: supple without masses or thyromegaly Lungs: clear to auscultation Heart: regular rate and rhythm Abdomen: soft, right lower quadrant tenderness at edge of C-section scar and no organomegaly Pelvic:EGBUS- wnl; vagina-normal rugae; uterus-normal size, cervix without lesions or motion tenderness; adnexae-no tenderness or masses Extremities:  no clubbing, cyanosis or edema   Assesment: Chronic Pelvic Pain   Disposition:Reviewed with the patient the  indication for her procedure along with the risks,  to include, but not limited to: reaction to anesthesia, damage to adjacent  organs, infection, excessive bleeding and an open abdominal incision.  The patient is also aware that if significant adhesions are found,  she may have lysis of adhesions with the assistance of the Robot.  She was given a Miralax Bowel Prep to complete the day before her surgery.  The patient verbalized understanding of these risks and pre-op instructions  and has consented to proceed with an Operative Laparoscopy with Possible Lysis of Adhesions (With or Without Robot Assistance at [facility] on Aug 22, 2020 @ 8:45 a.m  CSN# [id number]   Macoy Rodwell J. Lowell Guitar, PA-C  for Dr. Crist Fat. Rivard

## 2020-08-18 NOTE — Progress Notes (Signed)
There is persistent modecate pericardial effusion, but should not interfere with upcoming abdominal surgeyr. Okay to proceed with surgery on 5/9.  Thanks MJP

## 2020-08-19 ENCOUNTER — Other Ambulatory Visit: Payer: Self-pay

## 2020-08-19 ENCOUNTER — Other Ambulatory Visit (HOSPITAL_COMMUNITY): Payer: Managed Care, Other (non HMO)

## 2020-08-19 ENCOUNTER — Encounter (HOSPITAL_COMMUNITY)
Admission: RE | Admit: 2020-08-19 | Discharge: 2020-08-19 | Disposition: A | Discharge status: Home / Self Care | Payer: Managed Care, Other (non HMO) | Source: Ambulatory Visit | Attending: Obstetrics and Gynecology | Admitting: Obstetrics and Gynecology

## 2020-08-19 DIAGNOSIS — Z01812 Encounter for preprocedural laboratory examination: Secondary | ICD-10-CM | POA: Insufficient documentation

## 2020-08-19 LAB — CBC
HCT: 43.6 % (ref 36.0–46.0)
Hemoglobin: 14.3 g/dL (ref 12.0–15.0)
MCH: 29.3 pg (ref 26.0–34.0)
MCHC: 32.8 g/dL (ref 30.0–36.0)
MCV: 89.3 fL (ref 80.0–100.0)
Platelets: 227 10*3/uL (ref 150–400)
RBC: 4.88 MIL/uL (ref 3.87–5.11)
RDW: 12.8 % (ref 11.5–15.5)
WBC: 6 10*3/uL (ref 4.0–10.5)
nRBC: 0 % (ref 0.0–0.2)

## 2020-08-19 LAB — BASIC METABOLIC PANEL
Anion gap: 8 (ref 5–15)
BUN: 13 mg/dL (ref 6–20)
CO2: 25 mmol/L (ref 22–32)
Calcium: 9.1 mg/dL (ref 8.9–10.3)
Chloride: 106 mmol/L (ref 98–111)
Creatinine, Ser: 0.75 mg/dL (ref 0.44–1.00)
GFR, Estimated: >60 mL/min (ref >60)
Glucose, Bld: 64 mg/dL — ABNORMAL LOW (ref 70–99)
Potassium: 3.9 mmol/L (ref 3.5–5.1)
Sodium: 139 mmol/L (ref 135–145)

## 2020-08-21 ENCOUNTER — Other Ambulatory Visit (HOSPITAL_COMMUNITY)
Admission: RE | Admit: 2020-08-21 | Discharge: 2020-08-21 | Disposition: A | Discharge status: Home / Self Care | Payer: Managed Care, Other (non HMO) | Source: Ambulatory Visit | Attending: Obstetrics and Gynecology | Admitting: Obstetrics and Gynecology

## 2020-08-21 ENCOUNTER — Other Ambulatory Visit (HOSPITAL_COMMUNITY): Payer: Managed Care, Other (non HMO)

## 2020-08-21 DIAGNOSIS — Z20822 Contact with and (suspected) exposure to covid-19: Secondary | ICD-10-CM | POA: Diagnosis not present

## 2020-08-21 DIAGNOSIS — Z01812 Encounter for preprocedural laboratory examination: Secondary | ICD-10-CM | POA: Diagnosis present

## 2020-08-21 LAB — SARS CORONAVIRUS 2 (TAT 6-24 HRS): SARS Coronavirus 2: NEGATIVE

## 2020-08-21 NOTE — Progress Notes (Signed)
Called no answer

## 2020-08-21 NOTE — Anesthesia Preprocedure Evaluation (Addendum)
Anesthesia Evaluation  Patient identified by MRN, date of birth, ID band Patient awake    Reviewed: Allergy & Precautions, NPO status , Patient's Chart, lab work & pertinent test results  History of Anesthesia Complications (+) PONV and history of anesthetic complications  Airway Mallampati: II  TM Distance: >3 FB Neck ROM: Full    Dental no notable dental hx. (+) Dental Advisory Given, Teeth Intact   Pulmonary neg pulmonary ROS,    Pulmonary exam normal breath sounds clear to auscultation       Cardiovascular negative cardio ROS Normal cardiovascular exam Rhythm:Regular Rate:Normal     Neuro/Psych negative neurological ROS  negative psych ROS   GI/Hepatic Neg liver ROS, GERD  Medicated and Controlled,  Endo/Other  Hypothyroidism   Renal/GU negative Renal ROS  negative genitourinary   Musculoskeletal negative musculoskeletal ROS (+)   Abdominal   Peds  Hematology negative hematology ROS (+)   Anesthesia Other Findings   Reproductive/Obstetrics                           Anesthesia Physical Anesthesia Plan  ASA: II  Anesthesia Plan: General   Post-op Pain Management:    Induction: Intravenous  PONV Risk Score and Plan: 4 or greater and Midazolam, Dexamethasone, Ondansetron, Scopolamine patch - Pre-op, Treatment may vary due to age or medical condition and Diphenhydramine  Airway Management Planned: Oral ETT  Additional Equipment: None  Intra-op Plan:   Post-operative Plan: Extubation in OR  Informed Consent: I have reviewed the patients History and Physical, chart, labs and discussed the procedure including the risks, benefits and alternatives for the proposed anesthesia with the patient or authorized representative who has indicated his/her understanding and acceptance.     Dental advisory given  Plan Discussed with: CRNA  Anesthesia Plan Comments:       Anesthesia  Quick Evaluation

## 2020-08-22 ENCOUNTER — Encounter (HOSPITAL_BASED_OUTPATIENT_CLINIC_OR_DEPARTMENT_OTHER): Payer: Self-pay | Admitting: Obstetrics and Gynecology

## 2020-08-22 ENCOUNTER — Ambulatory Visit (HOSPITAL_BASED_OUTPATIENT_CLINIC_OR_DEPARTMENT_OTHER): Payer: Managed Care, Other (non HMO) | Admitting: Anesthesiology

## 2020-08-22 ENCOUNTER — Other Ambulatory Visit: Payer: Self-pay

## 2020-08-22 ENCOUNTER — Ambulatory Visit (HOSPITAL_BASED_OUTPATIENT_CLINIC_OR_DEPARTMENT_OTHER)
Admission: RE | Admit: 2020-08-22 | Discharge: 2020-08-22 | Disposition: A | Discharge status: Home / Self Care | Payer: Managed Care, Other (non HMO) | Attending: Obstetrics and Gynecology | Admitting: Obstetrics and Gynecology

## 2020-08-22 ENCOUNTER — Encounter (HOSPITAL_BASED_OUTPATIENT_CLINIC_OR_DEPARTMENT_OTHER)
Admission: RE | Disposition: A | Discharge status: Home / Self Care | Payer: Self-pay | Source: Home / Self Care | Attending: Obstetrics and Gynecology

## 2020-08-22 DIAGNOSIS — N736 Female pelvic peritoneal adhesions (postinfective): Secondary | ICD-10-CM | POA: Diagnosis not present

## 2020-08-22 DIAGNOSIS — R102 Pelvic and perineal pain: Secondary | ICD-10-CM

## 2020-08-22 DIAGNOSIS — Z885 Allergy status to narcotic agent status: Secondary | ICD-10-CM | POA: Diagnosis not present

## 2020-08-22 DIAGNOSIS — G8929 Other chronic pain: Secondary | ICD-10-CM

## 2020-08-22 DIAGNOSIS — Z79890 Hormone replacement therapy (postmenopausal): Secondary | ICD-10-CM | POA: Insufficient documentation

## 2020-08-22 DIAGNOSIS — Z79899 Other long term (current) drug therapy: Secondary | ICD-10-CM | POA: Insufficient documentation

## 2020-08-22 DIAGNOSIS — R1032 Left lower quadrant pain: Secondary | ICD-10-CM | POA: Diagnosis present

## 2020-08-22 HISTORY — DX: Other pericardial effusion (noninflammatory): I31.39

## 2020-08-22 HISTORY — DX: Other specified postprocedural states: R11.2

## 2020-08-22 HISTORY — DX: Bursopathy, unspecified: M71.9

## 2020-08-22 HISTORY — DX: Other complications of anesthesia, initial encounter: T88.59XA

## 2020-08-22 HISTORY — DX: Autoimmune thyroiditis: E06.3

## 2020-08-22 HISTORY — DX: Other specified conditions associated with female genital organs and menstrual cycle: N94.89

## 2020-08-22 HISTORY — DX: Presence of spectacles and contact lenses: Z97.3

## 2020-08-22 HISTORY — PX: LAPAROSCOPY: SHX197

## 2020-08-22 HISTORY — DX: Perforation of intestine (nontraumatic): K63.1

## 2020-08-22 HISTORY — DX: Other specified postprocedural states: Z98.890

## 2020-08-22 HISTORY — DX: Pericardial effusion (noninflammatory): I31.3

## 2020-08-22 LAB — POCT PREGNANCY, URINE: Preg Test, Ur: NEGATIVE

## 2020-08-22 SURGERY — LAPAROSCOPY OPERATIVE
Anesthesia: General | Site: Abdomen

## 2020-08-22 MED ORDER — MIDAZOLAM HCL 5 MG/5ML IJ SOLN
INTRAMUSCULAR | Status: DC | PRN
  Administered 2020-08-22: 2 mg via INTRAVENOUS

## 2020-08-22 MED ORDER — CELECOXIB 200 MG PO CAPS
400.0000 mg | ORAL_CAPSULE | ORAL | Status: AC
  Administered 2020-08-22: 400 mg via ORAL

## 2020-08-22 MED ORDER — CEFAZOLIN SODIUM-DEXTROSE 2-4 GM/100ML-% IV SOLN
2.0000 g | INTRAVENOUS | Status: AC
  Administered 2020-08-22: 2 g via INTRAVENOUS

## 2020-08-22 MED ORDER — SCOPOLAMINE 1 MG/3DAYS TD PT72
MEDICATED_PATCH | TRANSDERMAL | Status: AC
  Filled 2020-08-22: qty 1

## 2020-08-22 MED ORDER — SUGAMMADEX SODIUM 200 MG/2ML IV SOLN
INTRAVENOUS | Status: DC | PRN
  Administered 2020-08-22: 200 mg via INTRAVENOUS

## 2020-08-22 MED ORDER — SCOPOLAMINE 1 MG/3DAYS TD PT72
1.0000 | MEDICATED_PATCH | TRANSDERMAL | Status: DC
  Administered 2020-08-22: 1.5 mg via TRANSDERMAL

## 2020-08-22 MED ORDER — GABAPENTIN 300 MG PO CAPS
ORAL_CAPSULE | ORAL | Status: AC
  Filled 2020-08-22: qty 1

## 2020-08-22 MED ORDER — DEXAMETHASONE SODIUM PHOSPHATE 4 MG/ML IJ SOLN
INTRAMUSCULAR | Status: DC | PRN
  Administered 2020-08-22: 10 mg via INTRAVENOUS

## 2020-08-22 MED ORDER — LIDOCAINE 2% (20 MG/ML) 5 ML SYRINGE
INTRAMUSCULAR | Status: AC
  Filled 2020-08-22: qty 5

## 2020-08-22 MED ORDER — ENSURE PRE-SURGERY PO LIQD: 296.0000 mL | Freq: Once | ORAL | Status: DC

## 2020-08-22 MED ORDER — ONDANSETRON HCL 4 MG/2ML IJ SOLN
INTRAMUSCULAR | Status: AC
  Filled 2020-08-22: qty 2

## 2020-08-22 MED ORDER — ROCURONIUM BROMIDE 100 MG/10ML IV SOLN
INTRAVENOUS | Status: DC | PRN
  Administered 2020-08-22 (×2): 10 mg via INTRAVENOUS
  Administered 2020-08-22: 50 mg via INTRAVENOUS

## 2020-08-22 MED ORDER — LACTATED RINGERS IV SOLN: INTRAVENOUS | Status: DC

## 2020-08-22 MED ORDER — PROPOFOL 10 MG/ML IV BOLUS
INTRAVENOUS | Status: DC | PRN
  Administered 2020-08-22: 90 mg via INTRAVENOUS

## 2020-08-22 MED ORDER — CELECOXIB 200 MG PO CAPS
ORAL_CAPSULE | ORAL | Status: AC
  Filled 2020-08-22: qty 2

## 2020-08-22 MED ORDER — ROCURONIUM BROMIDE 10 MG/ML (PF) SYRINGE
PREFILLED_SYRINGE | INTRAVENOUS | Status: AC
  Filled 2020-08-22: qty 10

## 2020-08-22 MED ORDER — ONDANSETRON HCL 4 MG/2ML IJ SOLN
INTRAMUSCULAR | Status: DC | PRN
  Administered 2020-08-22: 4 mg via INTRAVENOUS

## 2020-08-22 MED ORDER — AMISULPRIDE (ANTIEMETIC) 5 MG/2ML IV SOLN
INTRAVENOUS | Status: AC
  Filled 2020-08-22: qty 4

## 2020-08-22 MED ORDER — FENTANYL CITRATE (PF) 100 MCG/2ML IJ SOLN
INTRAMUSCULAR | Status: DC | PRN
  Administered 2020-08-22 (×2): 50 ug via INTRAVENOUS
  Administered 2020-08-22: 100 ug via INTRAVENOUS
  Administered 2020-08-22: 50 ug via INTRAVENOUS

## 2020-08-22 MED ORDER — IBUPROFEN 600 MG PO TABS: ORAL_TABLET | ORAL | 1 refills | Status: DC

## 2020-08-22 MED ORDER — ACETAMINOPHEN 500 MG PO TABS
ORAL_TABLET | ORAL | Status: AC
  Filled 2020-08-22: qty 2

## 2020-08-22 MED ORDER — SODIUM CHLORIDE 0.9 % IV SOLN
INTRAVENOUS | Status: DC | PRN
  Administered 2020-08-22: 90 mL

## 2020-08-22 MED ORDER — PROPOFOL 10 MG/ML IV BOLUS
INTRAVENOUS | Status: AC
  Filled 2020-08-22: qty 20

## 2020-08-22 MED ORDER — EPHEDRINE SULFATE 50 MG/ML IJ SOLN
INTRAMUSCULAR | Status: DC | PRN
  Administered 2020-08-22 (×4): 10 mg via INTRAVENOUS

## 2020-08-22 MED ORDER — MIDAZOLAM HCL 2 MG/2ML IJ SOLN
INTRAMUSCULAR | Status: AC
  Filled 2020-08-22: qty 2

## 2020-08-22 MED ORDER — DEXAMETHASONE SODIUM PHOSPHATE 10 MG/ML IJ SOLN
INTRAMUSCULAR | Status: AC
  Filled 2020-08-22: qty 1

## 2020-08-22 MED ORDER — ACETAMINOPHEN 500 MG PO TABS
1000.0000 mg | ORAL_TABLET | ORAL | Status: AC
  Administered 2020-08-22: 1000 mg via ORAL

## 2020-08-22 MED ORDER — GABAPENTIN 300 MG PO CAPS
300.0000 mg | ORAL_CAPSULE | ORAL | Status: AC
  Administered 2020-08-22: 300 mg via ORAL

## 2020-08-22 MED ORDER — ACETAMINOPHEN 500 MG PO TABS: ORAL_TABLET | ORAL | 0 refills | Status: DC

## 2020-08-22 MED ORDER — AMISULPRIDE (ANTIEMETIC) 5 MG/2ML IV SOLN
10.0000 mg | Freq: Once | INTRAVENOUS | Status: AC
  Administered 2020-08-22: 10 mg via INTRAVENOUS

## 2020-08-22 MED ORDER — CEFAZOLIN SODIUM-DEXTROSE 2-4 GM/100ML-% IV SOLN
INTRAVENOUS | Status: AC
  Filled 2020-08-22: qty 100

## 2020-08-22 MED ORDER — SODIUM CHLORIDE 0.9 % IR SOLN
Status: DC | PRN
  Administered 2020-08-22: 3000 mL

## 2020-08-22 MED ORDER — EPHEDRINE 5 MG/ML INJ
INTRAVENOUS | Status: AC
  Filled 2020-08-22: qty 10

## 2020-08-22 MED ORDER — LIDOCAINE HCL (CARDIAC) PF 100 MG/5ML IV SOSY
PREFILLED_SYRINGE | INTRAVENOUS | Status: DC | PRN
  Administered 2020-08-22: 50 mg via INTRAVENOUS

## 2020-08-22 MED ORDER — POVIDONE-IODINE 10 % EX SWAB: 2.0000 "application " | Freq: Once | CUTANEOUS | Status: DC

## 2020-08-22 MED ORDER — FENTANYL CITRATE (PF) 250 MCG/5ML IJ SOLN
INTRAMUSCULAR | Status: AC
  Filled 2020-08-22: qty 5

## 2020-08-22 MED ORDER — FENTANYL CITRATE (PF) 100 MCG/2ML IJ SOLN: 25.0000 ug | INTRAMUSCULAR | Status: DC | PRN

## 2020-08-22 SURGICAL SUPPLY — 90 items
ADH SKN CLS APL DERMABOND .7 (GAUZE/BANDAGES/DRESSINGS) ×2
APL SWBSTK 6 STRL LF DISP (MISCELLANEOUS)
APPLICATOR COTTON TIP 6 STRL (MISCELLANEOUS) ×1 IMPLANT
APPLICATOR COTTON TIP 6IN STRL (MISCELLANEOUS)
BAG SPEC RTRVL LRG 6X4 10 (ENDOMECHANICALS)
BARRIER ADHS 3X4 INTERCEED (GAUZE/BANDAGES/DRESSINGS) ×1 IMPLANT
BRR ADH 4X3 ABS CNTRL BYND (GAUZE/BANDAGES/DRESSINGS)
CABLE HIGH FREQUENCY MONO STRZ (ELECTRODE) ×2 IMPLANT
CATH FOLEY 3WAY  5CC 16FR (CATHETERS) ×3
CATH FOLEY 3WAY 5CC 16FR (CATHETERS) ×2 IMPLANT
COVER BACK TABLE 60X90IN (DRAPES) ×3 IMPLANT
COVER TIP SHEARS 8 DVNC (MISCELLANEOUS) ×2 IMPLANT
COVER TIP SHEARS 8MM DA VINCI (MISCELLANEOUS) ×3
COVER WAND RF STERILE (DRAPES) ×3 IMPLANT
DECANTER SPIKE VIAL GLASS SM (MISCELLANEOUS) ×6 IMPLANT
DEFOGGER SCOPE WARMER CLEARIFY (MISCELLANEOUS) ×3 IMPLANT
DERMABOND ADVANCED (GAUZE/BANDAGES/DRESSINGS) ×1
DERMABOND ADVANCED .7 DNX12 (GAUZE/BANDAGES/DRESSINGS) ×2 IMPLANT
DILATOR CANAL MILEX (MISCELLANEOUS) ×2 IMPLANT
DISSECTOR BLUNT TIP ENDO 5MM (MISCELLANEOUS) IMPLANT
DRAPE ARM DVNC X/XI (DISPOSABLE) ×8 IMPLANT
DRAPE COLUMN DVNC XI (DISPOSABLE) ×2 IMPLANT
DRAPE DA VINCI XI ARM (DISPOSABLE) ×12
DRAPE DA VINCI XI COLUMN (DISPOSABLE) ×3
DRAPE UTILITY XL STRL (DRAPES) ×3 IMPLANT
DRSG OPSITE POSTOP 3X4 (GAUZE/BANDAGES/DRESSINGS) IMPLANT
DURAPREP 26ML APPLICATOR (WOUND CARE) ×3 IMPLANT
ELECT REM PT RETURN 9FT ADLT (ELECTROSURGICAL) ×3
ELECTRODE REM PT RTRN 9FT ADLT (ELECTROSURGICAL) ×2 IMPLANT
FORCEPS CUTTING 33CM 5MM (CUTTING FORCEPS) IMPLANT
FORCEPS CUTTING 45CM 5MM (CUTTING FORCEPS) IMPLANT
GAUZE 4X4 16PLY RFD (DISPOSABLE) ×3 IMPLANT
GLOVE SURG ENC MOIS LTX SZ7 (GLOVE) ×3 IMPLANT
GLOVE SURG LTX SZ6.5 (GLOVE) ×9 IMPLANT
GLOVE SURG UNDER POLY LF SZ7 (GLOVE) ×15 IMPLANT
GOWN STRL REUS W/TWL LRG LVL3 (GOWN DISPOSABLE) ×2 IMPLANT
HIBICLENS CHG 4% 4OZ (MISCELLANEOUS) ×1 IMPLANT
HOLDER FOLEY CATH W/STRAP (MISCELLANEOUS) IMPLANT
IRRIG SUCT STRYKERFLOW 2 WTIP (MISCELLANEOUS) ×3
IRRIGATION SUCT STRKRFLW 2 WTP (MISCELLANEOUS) ×2 IMPLANT
KIT TURNOVER CYSTO (KITS) ×3 IMPLANT
LEGGING LITHOTOMY PAIR STRL (DRAPES) ×3 IMPLANT
NDL SPNL 22GX7 QUINCKE BK (NEEDLE) IMPLANT
NEEDLE HYPO 22GX1.5 SAFETY (NEEDLE) ×3 IMPLANT
NEEDLE SPNL 22GX7 QUINCKE BK (NEEDLE) IMPLANT
OBTURATOR OPTICAL STANDARD 8MM (TROCAR) ×3
OBTURATOR OPTICAL STND 8 DVNC (TROCAR) ×2
OBTURATOR OPTICALSTD 8 DVNC (TROCAR) ×2 IMPLANT
OCCLUDER COLPOPNEUMO (BALLOONS) ×3 IMPLANT
PACK LAPAROSCOPY BASIN (CUSTOM PROCEDURE TRAY) ×3 IMPLANT
PACK ROBOT WH (CUSTOM PROCEDURE TRAY) ×3 IMPLANT
PACK ROBOTIC GOWN (GOWN DISPOSABLE) ×3 IMPLANT
PACK TRENDGUARD 450 HYBRID PRO (MISCELLANEOUS) ×2 IMPLANT
PAD OB MATERNITY 4.3X12.25 (PERSONAL CARE ITEMS) ×6 IMPLANT
PAD PREP 24X48 CUFFED NSTRL (MISCELLANEOUS) ×3 IMPLANT
POUCH LAPAROSCOPIC INSTRUMENT (MISCELLANEOUS) ×3 IMPLANT
POUCH SPECIMEN RETRIEVAL 10MM (ENDOMECHANICALS) IMPLANT
PROTECTOR NERVE ULNAR (MISCELLANEOUS) ×9 IMPLANT
SCISSORS LAP 5X35 DISP (ENDOMECHANICALS) IMPLANT
SEAL CANN UNIV 5-8 DVNC XI (MISCELLANEOUS) ×7 IMPLANT
SEAL XI 5MM-8MM UNIVERSAL (MISCELLANEOUS) ×9
SEALER VESSEL DA VINCI XI (MISCELLANEOUS)
SEALER VESSEL EXT DVNC XI (MISCELLANEOUS) IMPLANT
SET IRRIG Y TYPE TUR BLADDER L (SET/KITS/TRAYS/PACK) IMPLANT
SET SUCTION IRRIG HYDROSURG (IRRIGATION / IRRIGATOR) IMPLANT
SET TRI-LUMEN FLTR TB AIRSEAL (TUBING) ×3 IMPLANT
SET TUBE SMOKE EVAC HIGH FLOW (TUBING) ×1 IMPLANT
SLEEVE XCEL OPT CAN 5 100 (ENDOMECHANICALS) ×1 IMPLANT
SOL PREP PROV IODINE SCRUB 4OZ (MISCELLANEOUS) ×3 IMPLANT
SOLUTION ELECTROLUBE (MISCELLANEOUS) IMPLANT
STRIP CLOSURE SKIN 1/4X4 (GAUZE/BANDAGES/DRESSINGS) ×3 IMPLANT
SUT MNCRL AB 3-0 PS2 27 (SUTURE) ×2 IMPLANT
SUT VIC AB 0 CT1 27 (SUTURE) ×3
SUT VIC AB 0 CT1 27XBRD ANBCTR (SUTURE) ×3 IMPLANT
SUT VIC AB 2-0 UR6 27 (SUTURE) ×6 IMPLANT
SUT VICRYL 0 UR6 27IN ABS (SUTURE) ×4 IMPLANT
SUT VLOC 180 0 9IN  GS21 (SUTURE)
SUT VLOC 180 0 9IN GS21 (SUTURE) ×2 IMPLANT
TIP RUMI ORANGE 6.7MMX12CM (TIP) IMPLANT
TIP UTERINE 5.1X6CM LAV DISP (MISCELLANEOUS) IMPLANT
TIP UTERINE 6.7X10CM GRN DISP (MISCELLANEOUS) IMPLANT
TIP UTERINE 6.7X6CM WHT DISP (MISCELLANEOUS) IMPLANT
TIP UTERINE 6.7X8CM BLUE DISP (MISCELLANEOUS) IMPLANT
TOWEL OR 17X26 10 PK STRL BLUE (TOWEL DISPOSABLE) ×2 IMPLANT
TRAY FOLEY W/BAG SLVR 14FR LF (SET/KITS/TRAYS/PACK) ×1 IMPLANT
TRENDGUARD 450 HYBRID PRO PACK (MISCELLANEOUS) ×3
TROCAR BALLN 12MMX100 BLUNT (TROCAR) ×3 IMPLANT
TROCAR BLADELESS OPT 5 100 (ENDOMECHANICALS) ×5 IMPLANT
TROCAR PORT AIRSEAL 8X120 (TROCAR) ×3 IMPLANT
WATER STERILE IRR 1000ML POUR (IV SOLUTION) ×3 IMPLANT

## 2020-08-22 NOTE — Transfer of Care (Signed)
Immediate Anesthesia Transfer of Care Note  Patient: Melinda Forbes  Procedure(s) Performed: Procedure(s) (LRB): LAPAROSCOPY OPERATIVE, LYSIS OF ADHESIONS (N/A)  Patient Location: PACU  Anesthesia Type: General  Level of Consciousness: awake, sedated, patient cooperative and responds to stimulation  Airway & Oxygen Therapy: Patient Spontanous Breathing and Patient connected to [place] 02 and soft FM   Post-op Assessment: Report given to PACU RN, Post -op Vital signs reviewed and stable and Patient moving all extremities  Post vital signs: Reviewed and stable  Complications: No apparent anesthesia complications

## 2020-08-22 NOTE — Op Note (Signed)
Preoperative diagnosis: Chronic left lower quadrant pain  Postoperative diagnosis: Same with pelvic adhesions  Anesthesia: General  Anesthesiologist: Dr. Renold Don  Procedure:  Diagnostic laparoscopy with lysis of adhesions   Surgeon: Dr. Dois Davenport Sherol Sabas   Assistant: Henreitta Leber PA-C  Estimated blood loss: Minimal  Procedure:  After being informed of the planned procedure with possible complications including bleeding, infection, injury to other organs, need for laparotomy, informed consent is obtained and the patient is taken to OR # 5. She is placed in lithotomy position, prepped and draped in a sterile fashion, and a Foley catheter is inserted in her bladder.  Pelvic exam: External genitalia is normal, vagina is normal and cervix is normal.  Uterus is anteverted and anteflexed normal in size and mobile.  Both adnexa are normal.  A speculum is inserted in the vagina and the anterior lip of the cervix was grasped with tenaculum forcep. An acorn intrauterine manipulator is easily positioned and the speculum is removed.  We infiltrate the umbilical area using 10 cc of Ropivacaine 0.25 and perform a semi-elliptical incision which is brought down bluntly to the fascia. The fascia is identified and grasped with Coker forceps. The fascia is opened with Mayo scissors. Peritoneum is entered bluntly. A pursestring suture of 0 Vicryl is placed on the fascia. A 10 mm Hassan trocar is easily inserted in the abdominal cavity and is held in place both by the intrauterine balloon and the previously placed pursestring suture. This allows for easy insufflation of the pneumoperitoneum using warm CO2 at a maximum pressure of 15 mmHg. A operative laparoscope is inserted in the abdominal cavity.  Observation:We note in the anterior cul-de-sac some grade 1 and 2 adhesions between the bladder flap and the left anterior pelvic wall.  The uterus is normal.  Both tubes are normal.  The right ovary is normal.  The  left ovary is involved in a grade 2-3 adhesive process with the pelvic wall.  The posterior cul-de-sac is normal with no adhesions.  There are some grade 1 and 2 adhesions between the left colon and the anterior pelvic wall.  The appendix is visualized and normal.  Liver and gallbladder are normal.  We note grade 1 and 2 adhesions between the right upper colon and the anterior abdominal wall.  Finally we identify a thick adhesive band between the bladder and the insertion of the left round ligament leading to tension on the ligament.    We place two 5 mm laparoscopic trochars under direct visualization.  First 1 is in the left mid abdomen and the second 1 is in the right lower quadrant.  60 cc of ropivacaine 0.25 is dropped in the pelvis. This allows for systematic dissection of all previously described pelvic adhesions starting with the left abdominal wall and colon.  This releases all tension on the bowel.  We proceed with sharply dissecting adhesions involving the left ovary.  We are able to release most of these adhesions except for a grade 4 posterior adhesion attached to the pelvic wall and pulling the ureter.  We also sharply and bluntly dissect the right abdominal wall adhesions with the bowel to release it.  And finally we sharply dissected the thick adhesive band between the bladder and the left round ligament.  We irrigate profusely with warm saline and confirm a satisfactory hemostasis.  All instruments are then removed after evacuating the pneumoperitoneum. The fascia of the umbilical incision is closed with the purse string suture of 0 Vicryl.  All  3 incisions are then closed with Dermabond.  Surgical assistance was required due to the complexity of the case and Dr. Estanislado Pandy was scrubbed and present at all times.  Instrument and sponge count is complete x2. Estimated blood loss is minimal.The procedure is well tolerated by the patient is taken to recovery room in a well and stable  condition.    Specimen: None  Melinda Arthur  MD ATD@ [phone]

## 2020-08-22 NOTE — Discharge Instructions (Signed)
Call [facility] @ [phone] if:  You have a temperature greater than or equal to 100.4 degrees Farenheit orally You have pain that is not made better by the pain medication given and taken as directed You have excessive bleeding or problems urinating  Take Colace (Docusate Sodium/Stool Softener) 100 mg 2-3 times daily while taking narcotic pain medicine to avoid constipation or until bowel movements are regular. Starting at 2 p.m. to day:  Take Tylenol (Acetaminophen) 500 mg -2 tablets every 6 hours for 5 days then as needed for pain  AND  Ibuprofen 600 mg with food every 6 hours for 5 days then as needed for pain  You may drive after 24 hours You may walk up steps  You may shower tomorrow You may resume a regular diet  Keep incisions clean and dry Do not lift over 15 pounds for 2 weeks Avoid anything in vagina  until after your post-operative visit    DISCHARGE INSTRUCTIONS: Laparoscopy  The following instructions have been prepared to help you care for yourself upon your return home today.  Wound care: Marland Kitchen Do not get the incision wet for the first 24 hours. The incision should be kept clean and dry. . The Band-Aids or dressings may be removed the day after surgery. . Should the incision become sore, red, and swollen after the first week, check with your doctor.  Personal hygiene: . Shower the day after your procedure.  Activity and limitations: . Do NOT drive or operate any equipment today. . Do NOT lift anything more than 15 pounds for 2-3 weeks after surgery. . Do NOT rest in bed all day. . Walking is encouraged. Walk each day, starting slowly with 5-minute walks 3 or 4 times a day. Slowly increase the length of your walks. . Walk up and down stairs slowly. . Do NOT do strenuous activities, such as golfing, playing tennis, bowling, running, biking, weight lifting, gardening, mowing, or vacuuming for 2-4 weeks. Ask your doctor when it is okay to start.  Diet:  Eat a light meal as desired this evening. You may resume your usual diet tomorrow.  Return to work: This is dependent on the type of work you do. For the most part you can return to a desk job within a week of surgery. If you are more active at work, please discuss this with your doctor.  What to expect after your surgery: You may have a slight burning sensation when you urinate on the first day. You may have a very small amount of blood in the urine. Expect to have a small amount of vaginal discharge/light bleeding for 1-2 weeks. It is not unusual to have abdominal soreness and bruising for up to 2 weeks. You may be tired and need more rest for about 1 week. You may experience shoulder pain for 24-72 hours. Lying flat in bed may relieve it.  Call your doctor for any of the following: . Develop a fever of 100.4 or greater . Inability to urinate 6 hours after discharge from hospital . Severe pain not relieved by pain medications . Persistent of heavy bleeding at incision site . Redness or swelling around incision site after a week . Increasing nausea or vomiting  Patient Signature________________________________________ Nurse Signature_________________________________________      Post Anesthesia Home Care Instructions  Activity: Get plenty of rest for the remainder of the day. A responsible individual must stay with you for 24 hours following the procedure.  For the next 24 hours,  DO NOT: -Drive a car -Advertising copywriter -Drink alcoholic beverages -Take any medication unless instructed by your physician -Make any legal decisions or sign important papers.  Meals: Start with liquid foods such as gelatin or soup. Progress to regular foods as tolerated. Avoid greasy, spicy, heavy foods. If nausea and/or vomiting occur, drink only clear liquids until the nausea and/or vomiting subsides. Call your physician if vomiting continues.  Special Instructions/Symptoms: Your throat may feel dry  or sore from the anesthesia or the breathing tube placed in your throat during surgery. If this causes discomfort, gargle with warm salt water. The discomfort should disappear within 24 hours.  If you had a scopolamine patch placed behind your ear for the management of post- operative nausea and/or vomiting:  1. The medication in the patch is effective for 72 hours, after which it should be removed.  Wrap patch in a tissue and discard in the trash. Wash hands thoroughly with soap and water. 2. You may remove the patch earlier than 72 hours if you experience unpleasant side effects which may include dry mouth, dizziness or visual disturbances. 3. Avoid touching the patch. Wash your hands with soap and water after contact with the patch.

## 2020-08-22 NOTE — Anesthesia Procedure Notes (Signed)
Procedure Name: Intubation Date/Time: 08/22/2020 9:51 AM Performed by: Justice Rocher, CRNA Pre-anesthesia Checklist: Patient identified, Emergency Drugs available, Suction available, Patient being monitored and Timeout performed Patient Re-evaluated:Patient Re-evaluated prior to induction Oxygen Delivery Method: Circle system utilized Preoxygenation: Pre-oxygenation with 100% oxygen Induction Type: IV induction Ventilation: Mask ventilation without difficulty Laryngoscope Size: Mac and 3 Grade View: Grade I Tube type: Oral Tube size: 7.0 mm Number of attempts: 1 Airway Equipment and Method: Stylet and Oral airway Placement Confirmation: ETT inserted through vocal cords under direct vision,  positive ETCO2,  breath sounds checked- equal and bilateral and CO2 detector Secured at: 22 cm Tube secured with: Tape Dental Injury: Teeth and Oropharynx as per pre-operative assessment

## 2020-08-22 NOTE — Interval H&P Note (Signed)
History and Physical Interval Note:  08/22/2020 7:24 AM  Melinda Forbes  has presented today for surgery, with the diagnosis of LOWER LEFT QUADRANT PAIN WITH RECURRENT PELVIC INFECTION.  The various methods of treatment have been discussed with the patient and family. After consideration of risks, benefits and other options for treatment, the patient has consented to  Procedure(s) with comments: LAPAROSCOPY OPERATIVE (N/A) - WITH LYSIS OF ADHESIONS POSSIBLE ROBOT ASSISTED XI ROBOTIC ASSISTED LAPAROSCOPIC LYSIS OF ADHESION (N/A) as a surgical intervention.  The patient's history has been reviewed, patient examined, no change in status, stable for surgery.  I have reviewed the patient's chart and labs.  Questions were answered to the patient's satisfaction.     Dois Davenport A [name]

## 2020-08-23 ENCOUNTER — Encounter (HOSPITAL_BASED_OUTPATIENT_CLINIC_OR_DEPARTMENT_OTHER): Payer: Self-pay | Admitting: Obstetrics and Gynecology

## 2020-08-23 NOTE — Progress Notes (Signed)
Attempted to call pt, no answer. Left vm requesting call back.

## 2020-08-23 NOTE — Anesthesia Postprocedure Evaluation (Signed)
Anesthesia Post Note  Patient: Melinda Forbes  Procedure(s) Performed: LAPAROSCOPY OPERATIVE, LYSIS OF ADHESIONS (N/A Abdomen)     Patient location during evaluation: PACU Anesthesia Type: General Level of consciousness: sedated and patient cooperative Pain management: pain level controlled Vital Signs Assessment: post-procedure vital signs reviewed and stable Respiratory status: spontaneous breathing Cardiovascular status: stable Anesthetic complications: no   No complications documented.  Last Vitals:  Vitals:   08/22/20 1300 08/22/20 1422  BP: 115/64 (!) 107/58  Pulse: 64 69  Resp: 13 14  Temp: (!) 36.3 C   SpO2: 99% 100%    Last Pain:  Vitals:   08/23/20 1053  TempSrc:   PainSc: 2                  Lewie Loron

## 2020-08-28 NOTE — Progress Notes (Signed)
Called pt no answer, left a vm

## 2021-02-18 ENCOUNTER — Other Ambulatory Visit: Payer: Self-pay | Admitting: Family Medicine

## 2021-02-18 DIAGNOSIS — Z1231 Encounter for screening mammogram for malignant neoplasm of breast: Secondary | ICD-10-CM

## 2021-04-03 ENCOUNTER — Ambulatory Visit
Admission: RE | Admit: 2021-04-03 | Discharge: 2021-04-03 | Disposition: A | Discharge status: Home / Self Care | Payer: Managed Care, Other (non HMO) | Source: Ambulatory Visit

## 2021-04-03 DIAGNOSIS — Z1231 Encounter for screening mammogram for malignant neoplasm of breast: Secondary | ICD-10-CM

## 2022-03-03 ENCOUNTER — Other Ambulatory Visit: Payer: Self-pay | Admitting: Family Medicine

## 2022-03-03 DIAGNOSIS — Z1231 Encounter for screening mammogram for malignant neoplasm of breast: Secondary | ICD-10-CM

## 2022-04-30 ENCOUNTER — Ambulatory Visit
Admission: RE | Admit: 2022-04-30 | Discharge: 2022-04-30 | Disposition: A | Discharge status: Home / Self Care | Payer: Managed Care, Other (non HMO) | Source: Ambulatory Visit

## 2022-04-30 DIAGNOSIS — Z1231 Encounter for screening mammogram for malignant neoplasm of breast: Secondary | ICD-10-CM

## 2022-05-14 ENCOUNTER — Encounter: Payer: Self-pay | Admitting: Cardiology

## 2022-05-14 ENCOUNTER — Ambulatory Visit: Payer: Managed Care, Other (non HMO) | Admitting: Cardiology

## 2022-05-14 VITALS — BP 122/80 | HR 55 | Resp 16 | Ht 63.0 in | Wt 128.4 lb

## 2022-05-14 DIAGNOSIS — R072 Precordial pain: Secondary | ICD-10-CM

## 2022-05-14 DIAGNOSIS — I3139 Other pericardial effusion (noninflammatory): Secondary | ICD-10-CM

## 2022-05-14 NOTE — Telephone Encounter (Signed)
From patient.

## 2022-05-14 NOTE — Progress Notes (Signed)
Patient referred by Carol Ada, MD for pericardial effusion  Subjective:   Melinda Forbes, female    DOB: [birth year], 57 y.o.   MRN: [id number]   Chief Complaint  Patient presents with   Pericardial Effusion   Chest Pain   Follow-up    1 year     HPI  57 y.o. Caucsian female with Hashimoto thyroiditis, h/o pericardial effusion, precordial pain  I last saw the patient in 07/2020. Echocardiogram in 08/2020 showed moderate anteriorpericardial effusion with no hemodynamic compromise.   Recently, patient has had a lot of stressors.  She has total of 3 sudden deaths in her acquaintances.  Her daughter had emergency related to pregnancy.  Fortunately, things are better now.  During the time of stress, patient felt constant 24/7 chest pressure for 2 weeks.  She denies any chest pain or shortness of breath with physical activity, walks 10,000 steps daily.  She is concerned with the news of sudden deaths, and wants to be screened for coronary artery disease, as well as follow-up on pericardial effusion.  On a separate note, she underwent laparoscopic surgery for adhesions attached to her intestines, and has not had any colon issues since then.  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 Patient has had recurrent unexplained contained colonic peroration 2-3 times in last few months. CT scan with each event has shows small pericardial effusion. She denies any orthopnea, PND, leg edema. She has occasional episodes of chest pain or shortness of breath, unrelated to exertion, usually when she is under stress.   Patient is now scheduled to undergo laparoscopic surgery on May 12.    Current Outpatient Medications:    acetaminophen (TYLENOL) 500 MG tablet, take 2 tablets po every 6 hours x 5 days then prn for post operative pain (1st dose 2 p.m. today), Disp: 100 tablet, Rfl: 0   Calcium  Carbonate-Vit D-Min (CALCIUM 1200 PO), Take 1 tablet by mouth at bedtime. , Disp: , Rfl:    Cranberry 125 MG TABS, Take by mouth., Disp: , Rfl:    esomeprazole (NEXIUM) 20 MG capsule, at bedtime., Disp: , Rfl:    ibuprofen (ADVIL) 600 MG tablet, take 1 tablet po pc every 6 hours for 5 days then prn-post operative pain (1st dose 2 pm today), Disp: 30 tablet, Rfl: 1   levothyroxine (SYNTHROID) 88 MCG tablet, Take 1 tablet (88 mcg total) by mouth daily before breakfast., Disp: , Rfl:    RESTASIS 0.05 % ophthalmic emulsion, Place into both eyes., Disp: , Rfl:     Cardiovascular and other pertinent studies:  EKG 05/14/2022: Sinus rhythm 55 bpm Nonspecific T wave abnormality  Echocardiogram 08/13/2020: Normal LV systolic function with visual EF 55-60%. Left ventricle cavity is normal in size. Normal global wall motion. Normal diastolic filling pattern, normal LAP. Mild tricuspid regurgitation. No evidence of pulmonary hypertension. Mild pulmonic regurgitation. Moderate pericardial effusion, located anteriorly.  There is no hemodynamic significance. No prior study for comparison.  CT abdomen 12/19/2019: 1. Persistent but improved wall thickening with inflammatory stranding about the rectosigmoid colon, consistent with improved colitis. Previously identified contained perforation is more well-defined with decreased inflammatory changes as compared to prior exam. 2. No other new acute intra-abdominal or pelvic process. 3. Small to moderate pericardial effusion. 4. Prominence of the left pelvic vasculature and left gonadal vein, which can be seen the setting of pelvic congestion syndrome. No visible occlusive  thrombus.   Recent labs: 01/22/2022: Glucose 86, BUN/Cr 16/0.7. EGFR 98. K 4.4. Hb 14 HbA1C NA Chol 214, TG 122, HDL 56, LDL 136 TSH 2.2 normal  12/02/2019: Glucose 84, BUN/Cr 11/0.6. EGFR 103. Na/K 140/4.5.  H/H 12.6/36.8. MCV 87. Platelets 274 HbA1C N/A Lipid panel N/A TSH  2.6 normal    Review of Systems  Cardiovascular:  Positive for chest pain. Negative for dyspnea on exertion, leg swelling, palpitations and syncope.  Respiratory:  Negative for shortness of breath.          Vitals:   05/14/22 0835  BP: 122/80  Pulse: (!) 55  Resp: 16  SpO2: 97%     Body mass index is 22.75 kg/m. Filed Weights   05/14/22 0835  Weight: 128 lb 6.4 oz (58.2 kg)     Objective:   Physical Exam Vitals and nursing note reviewed.  Constitutional:      General: She is not in acute distress. Neck:     Vascular: No JVD.  Cardiovascular:     Rate and Rhythm: Normal rate and regular rhythm.     Heart sounds: Normal heart sounds. No murmur heard. Pulmonary:     Effort: Pulmonary effort is normal.     Breath sounds: Normal breath sounds. No wheezing or rales.  Musculoskeletal:     Right lower leg: No edema.     Left lower leg: No edema.         Assessment & Recommendations:   57 y.o. Caucsian female with Hashimoto thyroiditis, h/o pericardial effusion, precordial pain  Pericardial effusion: Prior history.  Symptoms not consistent with pericarditis/pericardial effusion.  Will obtain follow-up echocardiogram.  Precordial pain: Atypical.  Less likely to be angina.  Will obtain exercise treadmill stress test and calcium score scan for screening for CAD.  Given LDL of 136, this will help with restratification.  Further recommendations after above testing.    Nigel Mormon, MD Pager: [phone] Office: [phone]

## 2022-05-18 ENCOUNTER — Ambulatory Visit: Payer: Managed Care, Other (non HMO)

## 2022-05-18 DIAGNOSIS — R072 Precordial pain: Secondary | ICD-10-CM

## 2022-05-22 ENCOUNTER — Ambulatory Visit: Payer: Managed Care, Other (non HMO)

## 2022-05-22 DIAGNOSIS — I3139 Other pericardial effusion (noninflammatory): Secondary | ICD-10-CM

## 2022-06-04 ENCOUNTER — Ambulatory Visit: Payer: Managed Care, Other (non HMO) | Admitting: Cardiology

## 2022-06-04 ENCOUNTER — Encounter: Payer: Self-pay | Admitting: Cardiology

## 2022-06-04 VITALS — BP 119/75 | HR 63 | Resp 16 | Ht 63.0 in | Wt 127.0 lb

## 2022-06-04 DIAGNOSIS — R072 Precordial pain: Secondary | ICD-10-CM | POA: Insufficient documentation

## 2022-06-04 NOTE — Progress Notes (Signed)
Patient referred by Carol Ada, MD for pericardial effusion  Subjective:   Melinda Forbes, female    DOB: January 17, 1966, 57 y.o.   MRN: [id number]   Chief Complaint  Patient presents with   Pericardial Effusion   Follow-up   Results     HPI  57 y.o. Caucsian female with Hashimoto thyroiditis, h/o pericardial effusion, precordial pain  Reviewed recent test results with the patient, details below.    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 Patient has had recurrent unexplained contained colonic peroration 2-3 times in last few months. CT scan with each event has shows small pericardial effusion. She denies any orthopnea, PND, leg edema. She has occasional episodes of chest pain or shortness of breath, unrelated to exertion, usually when she is under stress.   Patient is now scheduled to undergo laparoscopic surgery on May 12.    Current Outpatient Medications:    Calcium Carbonate-Vit D-Min (CALCIUM 1200 PO), Take 1 tablet by mouth at bedtime. , Disp: , Rfl:    Cranberry 125 MG TABS, Take by mouth., Disp: , Rfl:    esomeprazole (NEXIUM) 20 MG capsule, at bedtime., Disp: , Rfl:    levothyroxine (SYNTHROID) 88 MCG tablet, Take 1 tablet (88 mcg total) by mouth daily before breakfast., Disp: , Rfl:    Naproxen Sod-diphenhydrAMINE (ALEVE PM PO), Take 80 mg by mouth daily., Disp: , Rfl:    Psyllium Fiber 0.52 g CAPS, Take 0.52 g by mouth daily., Disp: , Rfl:    RESTASIS 0.05 % ophthalmic emulsion, Place into both eyes., Disp: , Rfl:    Zinc 50 MG TABS, Take by mouth., Disp: , Rfl:     Cardiovascular and other pertinent studies:  CT cardiac scoring 05/22/2022: - Total Calcium Score: 0.  - Cardiovascular:  Normal heart size. Small pericardial effusion.  - Mediastinum: No adenopathy.  - Visible abdomen: Hypoattenuating lesions within the visualized hepatic parenchyma,  with internal contents measuring simple fluid attenuation, compatible with hepatic cysts.  - Visible lung fields: Scattered calcified granulomas, reflecting the sequela from prior granulomatous disease. Otherwise unremarkable.   Echocardiogram 05/22/2022:  Normal LV systolic function with EF 71%. Left ventricle cavity is normal  in size. Normal left ventricular wall thickness. Normal global wall  motion.  Small pericardial effusion, most prominent anterior to RA/RV.  No significant valvular abnormality.  No evidence of pulmonary hypertension.  No significant change compared to previous study on 08/13/2020.   Exercise treadmill stress test 05/19/2022: Exercise treadmill stress test performed using Bruce protocol.  Patient reached 12.5 METS, and 98% of age predicted maximum heart rate.  Exercise capacity was excellent.  No chest pain reported.  Normal heart rate and hemodynamic response. Stress EKG revealed no ischemic changes. Low risk study.   EKG 05/14/2022: Sinus rhythm 55 bpm Nonspecific T wave abnormality  CT abdomen 12/19/2019: 1. Persistent but improved wall thickening with inflammatory stranding about the rectosigmoid colon, consistent with improved colitis. Previously identified contained perforation is more well-defined with decreased inflammatory changes as compared to prior exam. 2. No other new acute intra-abdominal or pelvic process. 3. Small to moderate pericardial effusion. 4. Prominence of the left pelvic vasculature and left gonadal vein, which can be seen the setting of pelvic congestion syndrome. No visible occlusive thrombus.   Recent labs: 01/22/2022: Glucose 86, BUN/Cr 16/0.7. EGFR 98. K 4.4. Hb 14 HbA1C NA Chol 214, TG 122, HDL 56,  LDL 136 TSH 2.2 normal  12/02/2019: Glucose 84, BUN/Cr 11/0.6. EGFR 103. Na/K 140/4.5.  H/H 12.6/36.8. MCV 87. Platelets 274 HbA1C N/A Lipid panel N/A TSH 2.6 normal    Review of Systems  Cardiovascular:  Positive for chest  pain. Negative for dyspnea on exertion, leg swelling, palpitations and syncope.  Respiratory:  Negative for shortness of breath.          Vitals:   06/04/22 0945  BP: 119/75  Pulse: 63  Resp: 16  SpO2: 96%     Body mass index is 22.5 kg/m. Filed Weights   06/04/22 0945  Weight: 127 lb (57.6 kg)     Objective:   Physical Exam Vitals and nursing note reviewed.  Constitutional:      General: She is not in acute distress. Neck:     Vascular: No JVD.  Cardiovascular:     Rate and Rhythm: Normal rate and regular rhythm.     Heart sounds: Normal heart sounds. No murmur heard. Pulmonary:     Effort: Pulmonary effort is normal.     Breath sounds: Normal breath sounds. No wheezing or rales.  Musculoskeletal:     Right lower leg: No edema.     Left lower leg: No edema.         Assessment & Recommendations:   57 y.o. Caucsian female with Hashimoto thyroiditis, h/o pericardial effusion, precordial pain  Pericardial effusion: Small, unchanged. I do not think this effusion is causing any of patient's symptoms at this time.  Precordial pain: Calcium score 0. Normal exercise treadmill stress test. LDL 136. Calcium score 0. Recommend diet and lifestyle modification.  F/u as needed    Nigel Mormon, MD Pager: [phone] Office: [phone]

## 2022-08-12 HISTORY — PX: MYRINGOTOMY WITH TUBE PLACEMENT: SHX5663

## 2023-01-18 ENCOUNTER — Encounter (INDEPENDENT_AMBULATORY_CARE_PROVIDER_SITE_OTHER): Payer: Self-pay | Admitting: Otolaryngology

## 2023-01-18 ENCOUNTER — Ambulatory Visit (INDEPENDENT_AMBULATORY_CARE_PROVIDER_SITE_OTHER): Payer: Managed Care, Other (non HMO) | Admitting: Otolaryngology

## 2023-01-18 VITALS — Ht 63.0 in | Wt 127.0 lb

## 2023-01-18 DIAGNOSIS — H6982 Other specified disorders of Eustachian tube, left ear: Secondary | ICD-10-CM

## 2023-01-18 DIAGNOSIS — J310 Chronic rhinitis: Secondary | ICD-10-CM

## 2023-01-18 DIAGNOSIS — R0981 Nasal congestion: Secondary | ICD-10-CM

## 2023-01-18 DIAGNOSIS — J343 Hypertrophy of nasal turbinates: Secondary | ICD-10-CM

## 2023-01-18 DIAGNOSIS — H905 Unspecified sensorineural hearing loss: Secondary | ICD-10-CM

## 2023-01-18 DIAGNOSIS — J342 Deviated nasal septum: Secondary | ICD-10-CM

## 2023-01-19 DIAGNOSIS — J310 Chronic rhinitis: Secondary | ICD-10-CM | POA: Insufficient documentation

## 2023-01-19 DIAGNOSIS — H6982 Other specified disorders of Eustachian tube, left ear: Secondary | ICD-10-CM | POA: Insufficient documentation

## 2023-01-19 NOTE — Progress Notes (Signed)
Patient ID: Melinda Forbes, female   DOB: 1966-03-30, 57 y.o.   MRN: [id number]  Follow-up: Sudden left ear hearing loss, left ear tinnitus, left ear eustachian tube dysfunction, chronic nasal congestion  HPI: The patient is a 57 year old female who returns today complaining of persistent nasal congestion and left ear pressure.  She was previously seen for sudden left ear hearing loss and left ear tinnitus.  She was treated with chemical labyrinthotomy with dexamethasone infusion.  At her last visit, her left ear hearing loss had significantly improved.  According to the patient, she has a persistent pressure sensation in her left ear and chronic nasal congestion.  She has a very hypernasal voice.  Currently she denies any fever or visual change.  Exam: General: Communicates without difficulty, well nourished, no acute distress. Head: Normocephalic, no evidence injury, no tenderness, facial buttresses intact without stepoff. Face/sinus: No tenderness to palpation and percussion. Facial movement is normal and symmetric. Eyes: PERRL, EOMI. No scleral icterus, conjunctivae clear. Neuro: CN II exam reveals vision grossly intact.  No nystagmus at any point of gaze. Ears: Auricles well formed without lesions.  Ear canals are intact without mass or lesion.  No erythema or edema is appreciated.  The left ventilating tube is in place and patent.  Nose: External evaluation reveals normal support and skin without lesions.  Dorsum is intact.  Anterior rhinoscopy reveals congested mucosa over anterior aspect of inferior turbinates and intact septum.  No purulence noted. Oral:  Oral cavity and oropharynx are intact, symmetric, without erythema or edema.  Mucosa is moist without lesions. Neck: Full range of motion without pain.  There is no significant lymphadenopathy.  No masses palpable.  Thyroid bed within normal limits to palpation.  Parotid glands and submandibular glands equal bilaterally without mass.  Trachea is  midline. Neuro:  CN 2-12 grossly intact.   Assessment 1.  Chronic rhinitis with nasal mucosal congestion, nasal septal deviation, and bilateral inferior turbinate hypertrophy. 2.  Clinical left ear eustachian tube dysfunction. 3.  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  Plan  1.  The physical exam findings are reviewed with the patient.  2.  Continue Flonase nasal spray 2 sprays each nostril daily.  The importance of consistent daily use is discussed.  3.  Valsalva exercise multiple times a day.  4.  The option of otology referral for possible eustachian tuboplasty is also discussed. 5.  The patient will return for reevaluation in 2 months.

## 2023-03-22 ENCOUNTER — Ambulatory Visit (INDEPENDENT_AMBULATORY_CARE_PROVIDER_SITE_OTHER): Payer: Managed Care, Other (non HMO)

## 2023-04-27 ENCOUNTER — Other Ambulatory Visit: Payer: Self-pay | Admitting: Medical Genetics

## 2023-05-24 IMAGING — MG MM DIGITAL SCREENING BILAT W/ TOMO AND CAD
8 series · 9 of 24 positions shown · non-contrast
Comparison: Previous exam(s).

CLINICAL DATA: Screening.

EXAM:
DIGITAL SCREENING BILATERAL MAMMOGRAM WITH TOMOSYNTHESIS AND CAD
TECHNIQUE: Bilateral screening digital craniocaudal and mediolateral oblique
mammograms were obtained. Bilateral screening digital breast
tomosynthesis was performed. The images were evaluated with
computer-aided detection.

[R MLO synth-2D]
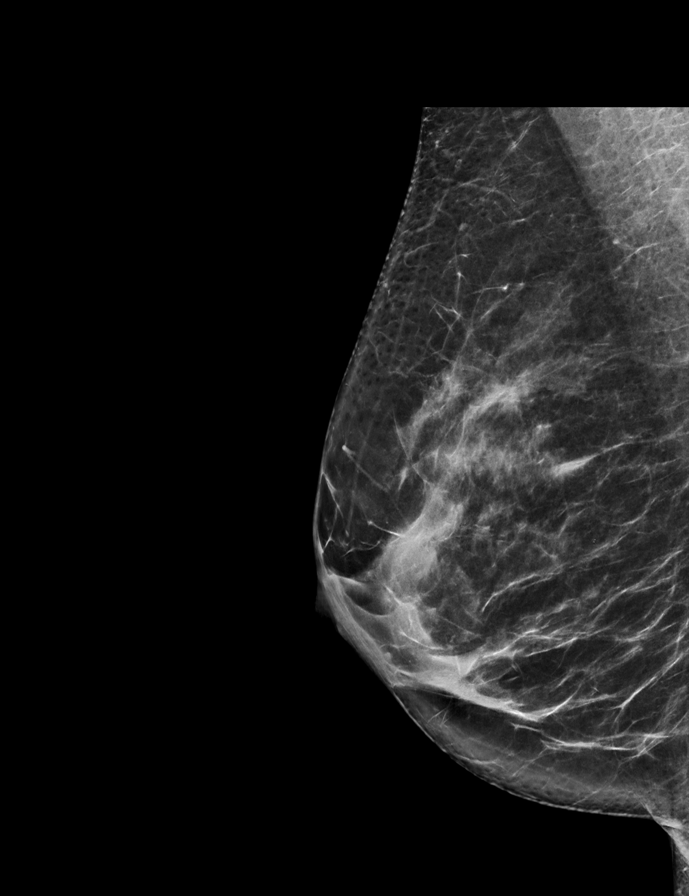

[L CC synth-2D]
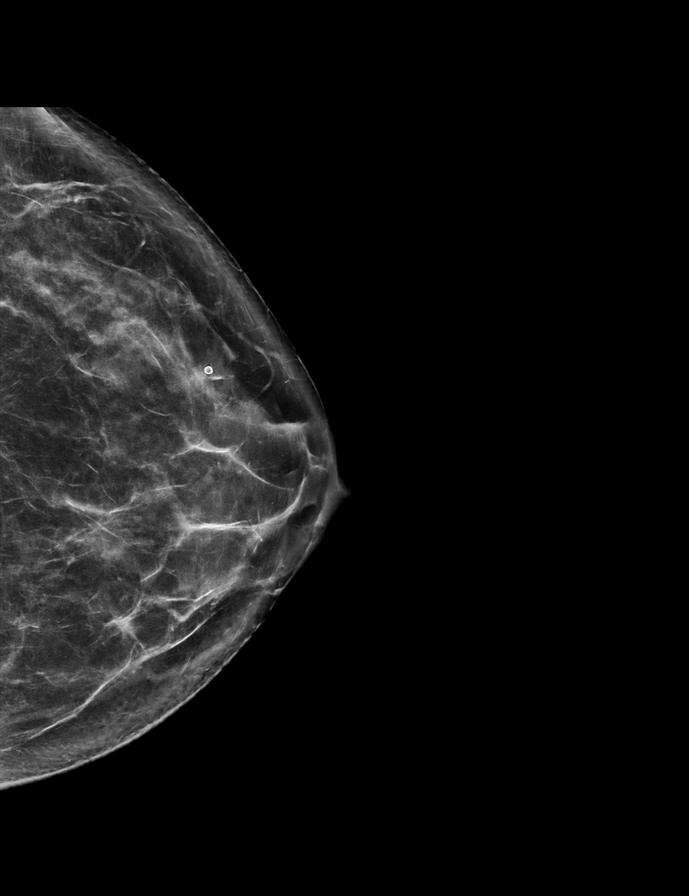

[L MLO synth-2D]
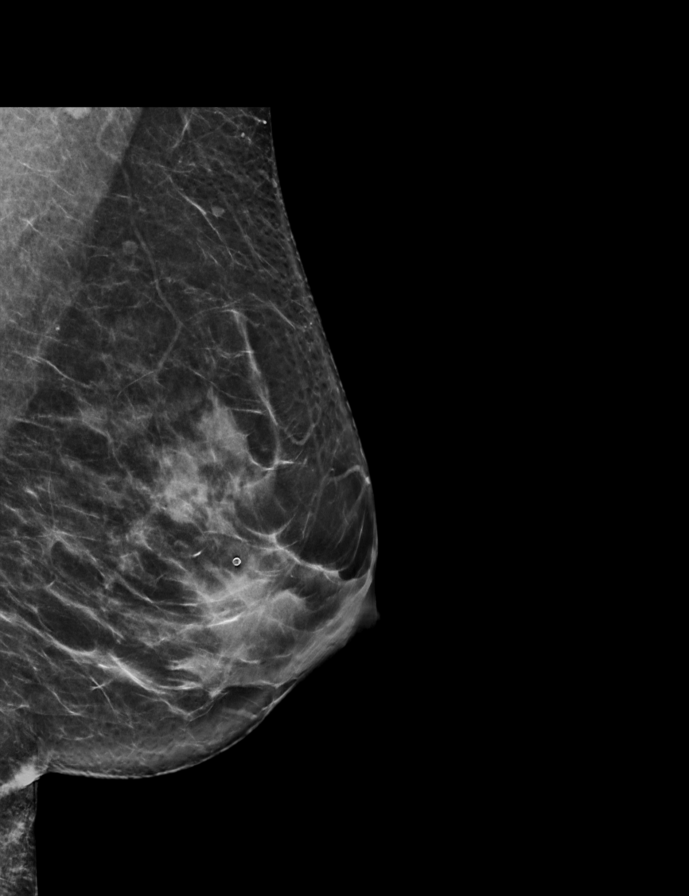

[R CC synth-2D]
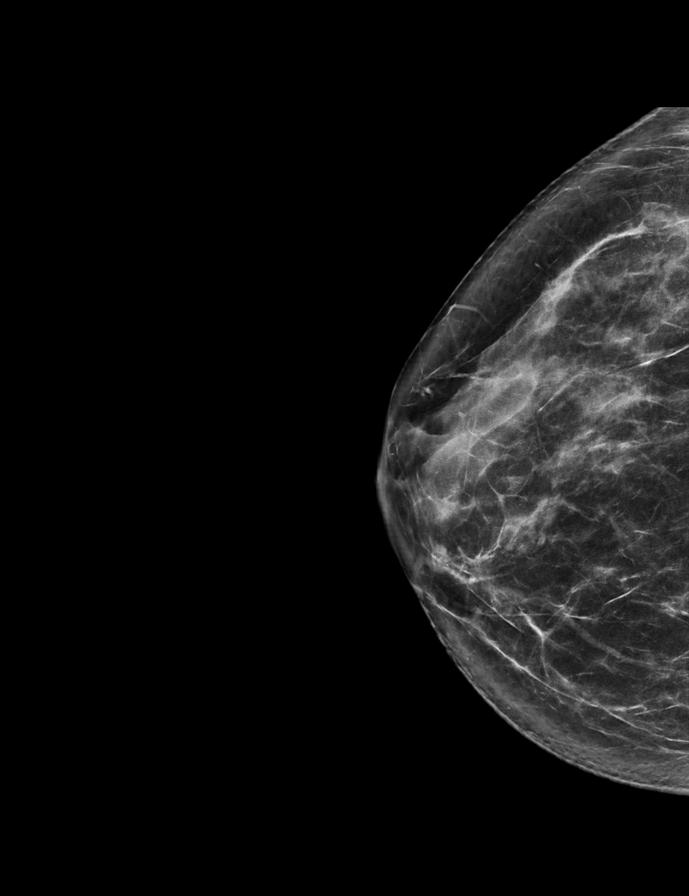

[R CC tomo · 2 of 71 frames shown]
[frame 23/71]
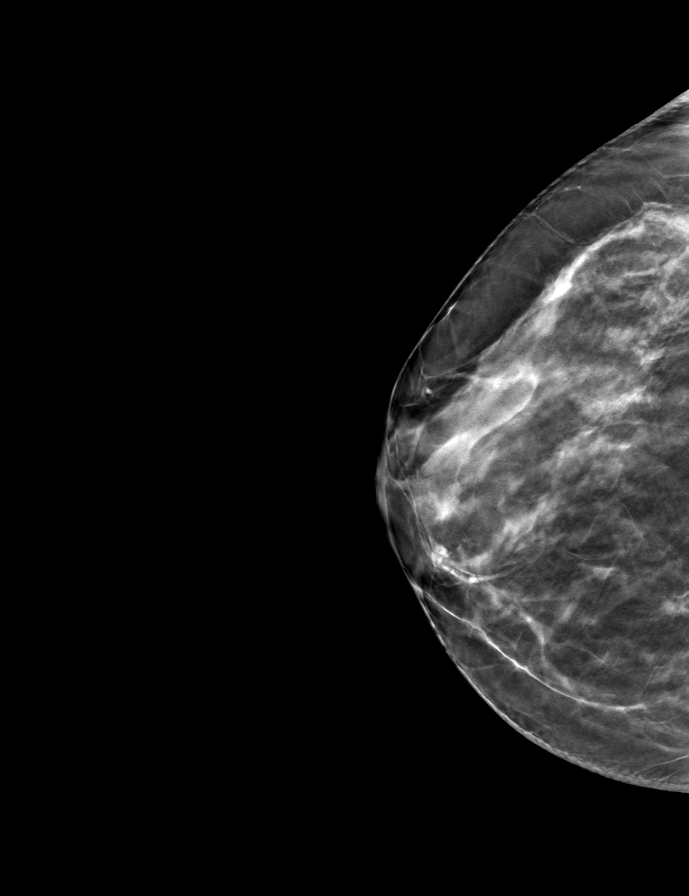
[frame 36/71]
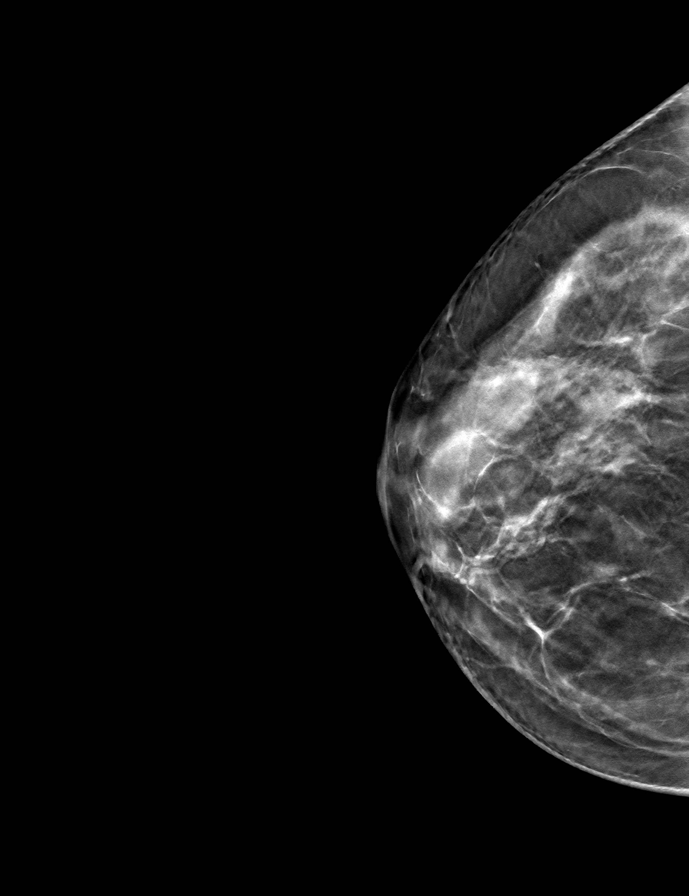

[L MLO tomo · tomo slice 37/72.0]
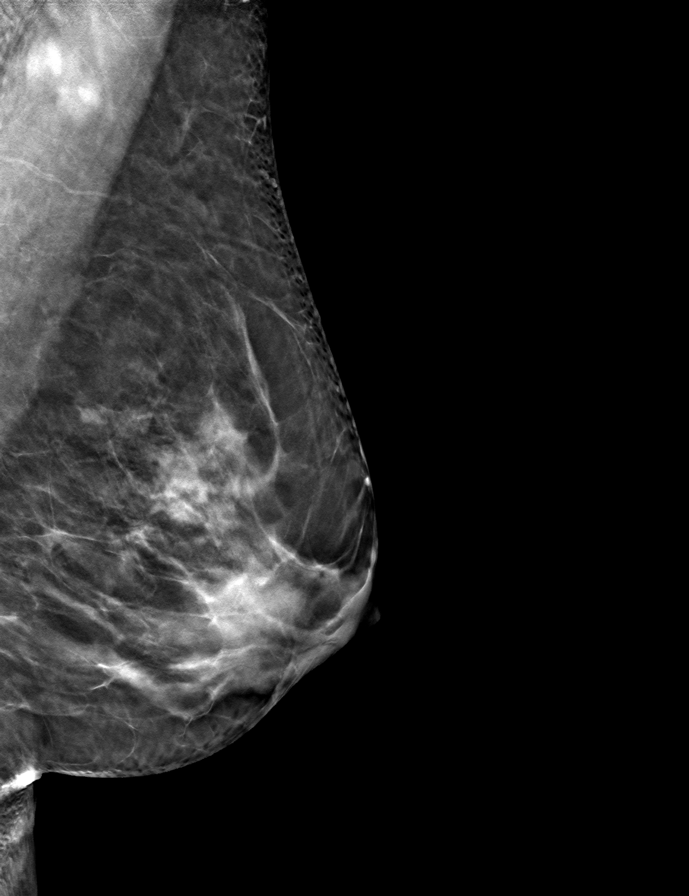

[R MLO tomo · tomo slice 38/75.0]
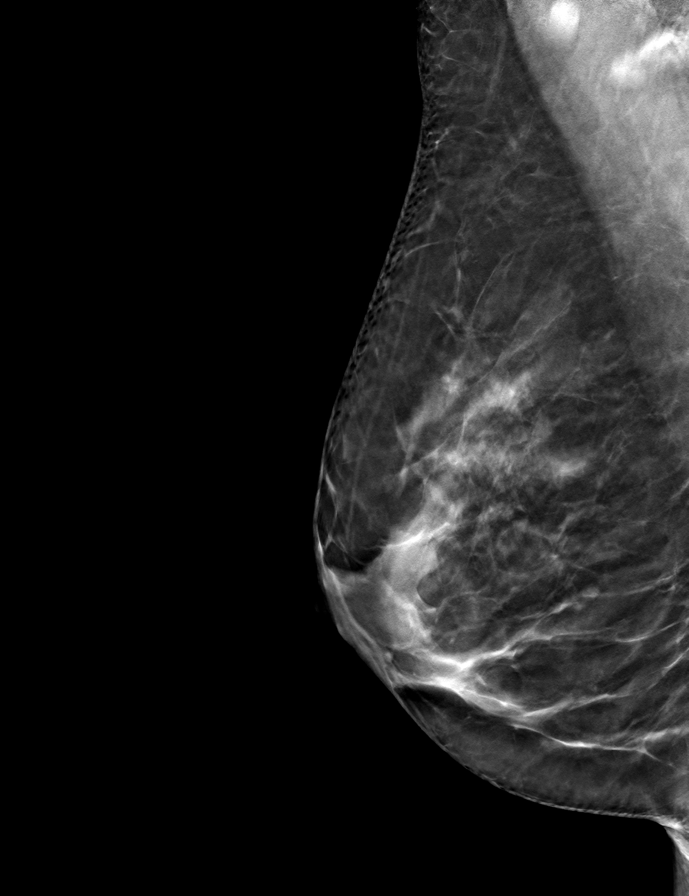

[L CC tomo · tomo slice 37/73.0]
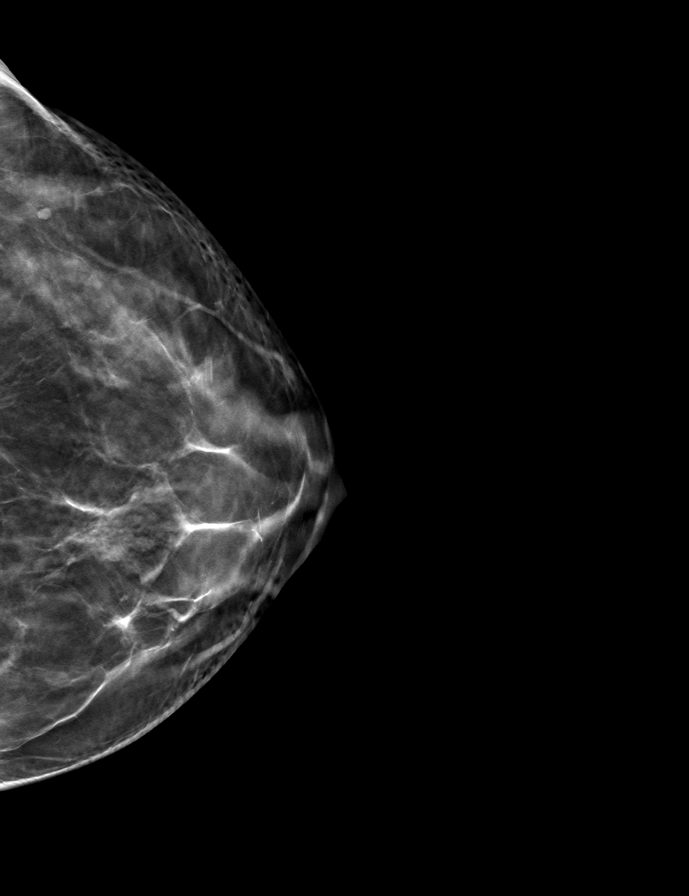

[9 of 24 positions shown; findings below may reference images not displayed]

ACR Breast Density Category c: The breast tissue is heterogeneously
dense, which may obscure small masses.
FINDINGS: There are no findings suspicious for malignancy.
IMPRESSION: No mammographic evidence of malignancy. A result letter of this
screening mammogram will be mailed directly to the patient.

RECOMMENDATION:
Screening mammogram in one year. (Code:Q3-W-BC3)

BI-RADS CATEGORY  1: Negative.

## 2023-06-03 ENCOUNTER — Other Ambulatory Visit: Payer: Self-pay | Admitting: Family Medicine

## 2023-06-03 DIAGNOSIS — Z1231 Encounter for screening mammogram for malignant neoplasm of breast: Secondary | ICD-10-CM

## 2023-06-10 ENCOUNTER — Other Ambulatory Visit (HOSPITAL_COMMUNITY)
Admission: RE | Admit: 2023-06-10 | Discharge: 2023-06-10 | Disposition: A | Discharge status: Home / Self Care | Payer: Self-pay | Source: Ambulatory Visit | Attending: Oncology | Admitting: Oncology

## 2023-06-18 ENCOUNTER — Ambulatory Visit: Payer: Managed Care, Other (non HMO)

## 2023-06-22 LAB — GENECONNECT MOLECULAR SCREEN: Genetic Analysis Overall Interpretation: NEGATIVE

## 2023-07-02 ENCOUNTER — Ambulatory Visit
Admission: RE | Admit: 2023-07-02 | Discharge: 2023-07-02 | Disposition: A | Discharge status: Home / Self Care | Source: Ambulatory Visit | Attending: Family Medicine | Admitting: Family Medicine

## 2023-07-02 DIAGNOSIS — Z1231 Encounter for screening mammogram for malignant neoplasm of breast: Secondary | ICD-10-CM

## 2023-08-09 ENCOUNTER — Telehealth (INDEPENDENT_AMBULATORY_CARE_PROVIDER_SITE_OTHER): Payer: Self-pay | Admitting: Otolaryngology

## 2023-08-09 NOTE — Telephone Encounter (Signed)
 Flonase nasal spray was called in with 5 refill.

## 2023-11-12 DIAGNOSIS — C50919 Malignant neoplasm of unspecified site of unspecified female breast: Secondary | ICD-10-CM

## 2023-11-12 HISTORY — DX: Malignant neoplasm of unspecified site of unspecified female breast: C50.919

## 2023-11-18 ENCOUNTER — Telehealth (INDEPENDENT_AMBULATORY_CARE_PROVIDER_SITE_OTHER): Payer: Self-pay | Admitting: Otolaryngology

## 2023-11-18 NOTE — Telephone Encounter (Signed)
 Patient called this morning and stated she requested records.  She has only received records from Epic and did not receive the records from Dr. Rojean old system.  I faxed the records request to [facility] HIM Dept along with the records from Dr. Rojean old system so that they could be sent along with the records from Epic.  I called the [facility] HIM Dept to follow up on the request and was told that the records from Epic and the records from Dr. Rojean old system (19 pages total) were emailed to the patient from [email or web address] on 11/10/2023.  I called the patient back to relay that information.  She is going to check the information she received again and will call me back if she can not find them to request they be sent again.

## 2023-11-23 ENCOUNTER — Other Ambulatory Visit: Payer: Self-pay | Admitting: Family Medicine

## 2023-11-23 DIAGNOSIS — N6341 Unspecified lump in right breast, subareolar: Secondary | ICD-10-CM

## 2023-12-01 ENCOUNTER — Other Ambulatory Visit: Payer: Self-pay | Admitting: Family Medicine

## 2023-12-01 ENCOUNTER — Ambulatory Visit
Admission: RE | Admit: 2023-12-01 | Discharge: 2023-12-01 | Disposition: A | Discharge status: Home / Self Care | Source: Ambulatory Visit | Attending: Family Medicine | Admitting: Family Medicine

## 2023-12-01 ENCOUNTER — Inpatient Hospital Stay
Admission: RE | Admit: 2023-12-01 | Discharge: 2023-12-01 | Discharge status: Home / Self Care | Source: Ambulatory Visit | Attending: Family Medicine | Admitting: Family Medicine

## 2023-12-01 DIAGNOSIS — N6341 Unspecified lump in right breast, subareolar: Secondary | ICD-10-CM

## 2023-12-01 DIAGNOSIS — N6310 Unspecified lump in the right breast, unspecified quadrant: Secondary | ICD-10-CM

## 2023-12-06 ENCOUNTER — Encounter (INDEPENDENT_AMBULATORY_CARE_PROVIDER_SITE_OTHER): Payer: Self-pay | Admitting: Otolaryngology

## 2023-12-06 ENCOUNTER — Ambulatory Visit (INDEPENDENT_AMBULATORY_CARE_PROVIDER_SITE_OTHER): Admitting: Otolaryngology

## 2023-12-06 VITALS — BP 116/75 | HR 61

## 2023-12-06 DIAGNOSIS — R0981 Nasal congestion: Secondary | ICD-10-CM | POA: Diagnosis not present

## 2023-12-06 DIAGNOSIS — Z9629 Presence of other otological and audiological implants: Secondary | ICD-10-CM

## 2023-12-06 DIAGNOSIS — J310 Chronic rhinitis: Secondary | ICD-10-CM

## 2023-12-06 DIAGNOSIS — H6982 Other specified disorders of Eustachian tube, left ear: Secondary | ICD-10-CM

## 2023-12-06 DIAGNOSIS — Z09 Encounter for follow-up examination after completed treatment for conditions other than malignant neoplasm: Secondary | ICD-10-CM

## 2023-12-06 DIAGNOSIS — Z8669 Personal history of other diseases of the nervous system and sense organs: Secondary | ICD-10-CM

## 2023-12-06 DIAGNOSIS — H6122 Impacted cerumen, left ear: Secondary | ICD-10-CM

## 2023-12-07 DIAGNOSIS — H6122 Impacted cerumen, left ear: Secondary | ICD-10-CM | POA: Insufficient documentation

## 2023-12-07 NOTE — Progress Notes (Signed)
 Patient ID: Melinda Forbes, female   DOB: 11/27/1965, 58 y.o.   MRN: [id number]  CC: Clogging sensation in the left ear  HPI: The patient is a 58 year old female who presents today complaining of clogging sensation in her left ear since October 15, 2023.  The patient has a history of left ear eustachian tube dysfunction, sudden left ear hearing loss, and left ear tinnitus.  She was treated with chemical labyrinthotomy with left ear dexamethasone  infusion.  The sudden hearing loss had significantly improved.  The left ear ventilating tube was left in place to treat her left ear eustachian tube dysfunction.  According to the patient, she was doing well until early July, when she started experiencing clogging sensation in her left ear.  She has been using Flonase nasal spray intermittently.  She denies any significant otalgia, otorrhea, vertigo, change in her hearing, facial pain, or fever.  Exam: General: Communicates without difficulty, well nourished, no acute distress. Head: Normocephalic, no evidence injury, no tenderness, facial buttresses intact without stepoff. Face/sinus: No tenderness to palpation and percussion. Facial movement is normal and symmetric. Eyes: PERRL, EOMI. No scleral icterus, conjunctivae clear. Neuro: CN II exam reveals vision grossly intact.  No nystagmus at any point of gaze. Ears: Auricles well formed without lesions.  The left ear ventilating tube has extruded into the ear canal, and is encased within impacted cerumen.  The left tympanic membrane is noted to be intact.  The right ear canal and tympanic membrane are also normal.  Nose: External evaluation reveals normal support and skin without lesions.  Dorsum is intact.  Anterior rhinoscopy reveals congested mucosa over anterior aspect of inferior turbinates and intact septum.  No purulence noted. Oral:  Oral cavity and oropharynx are intact, symmetric, without erythema or edema.  Mucosa is moist without lesions. Neck: Full range  of motion without pain.  There is no significant lymphadenopathy.  No masses palpable.  Thyroid  bed within normal limits to palpation.  Parotid glands and submandibular glands equal bilaterally without mass.  Trachea is midline. Neuro:  CN 2-12 grossly intact.   Procedure: Left ear cerumen disimpaction and tube removal Anesthesia: None Description: Under the operating microscope, the cerumen is carefully removed with a combination of cerumen currette, alligator forceps, and suction catheters.  The Julli ventilating tube is also removed.  After the cerumen is removed, the TMs are noted to be normal.  No mass, erythema, or lesions. The patient tolerated the procedure well.    Assessment: 1.  The left ear ventilating tube has extruded into the ear canal, and is encased within impacted cerumen.  After the cerumen and tube removal procedure, both tympanic membranes and middle ear spaces are noted to be normal. 2.  Left ear eustachian tube dysfunction. 3.  Chronic rhinitis with nasal mucosal congestion.  Plan: 1.  Otomicroscopy with left ear cerumen and tube removal. 2.  The physical exam findings are reviewed with the patient. 3.  Flonase nasal spray 2 sprays each nostril daily. 4.  Valsalva exercise multiple times a day. 5.  The patient is encouraged to call with any questions or concerns.

## 2023-12-09 ENCOUNTER — Ambulatory Visit
Admission: RE | Admit: 2023-12-09 | Discharge: 2023-12-09 | Disposition: A | Discharge status: Home / Self Care | Source: Ambulatory Visit | Attending: Family Medicine | Admitting: Family Medicine

## 2023-12-09 DIAGNOSIS — N6310 Unspecified lump in the right breast, unspecified quadrant: Secondary | ICD-10-CM

## 2023-12-09 HISTORY — PX: BREAST BIOPSY: SHX20

## 2023-12-10 LAB — SURGICAL PATHOLOGY

## 2023-12-17 ENCOUNTER — Ambulatory Visit: Payer: Self-pay | Admitting: General Surgery

## 2023-12-17 DIAGNOSIS — C50411 Malignant neoplasm of upper-outer quadrant of right female breast: Secondary | ICD-10-CM

## 2023-12-20 ENCOUNTER — Encounter (HOSPITAL_BASED_OUTPATIENT_CLINIC_OR_DEPARTMENT_OTHER): Payer: Self-pay | Admitting: General Surgery

## 2023-12-20 ENCOUNTER — Other Ambulatory Visit: Payer: Self-pay | Admitting: *Deleted

## 2023-12-20 ENCOUNTER — Other Ambulatory Visit: Payer: Self-pay | Admitting: General Surgery

## 2023-12-20 ENCOUNTER — Other Ambulatory Visit: Payer: Self-pay

## 2023-12-20 DIAGNOSIS — C50411 Malignant neoplasm of upper-outer quadrant of right female breast: Secondary | ICD-10-CM | POA: Insufficient documentation

## 2023-12-21 ENCOUNTER — Ambulatory Visit: Attending: General Surgery | Admitting: Physical Therapy

## 2023-12-21 ENCOUNTER — Ambulatory Visit
Admission: RE | Admit: 2023-12-21 | Discharge: 2023-12-21 | Disposition: A | Discharge status: Home / Self Care | Source: Ambulatory Visit | Attending: General Surgery | Admitting: General Surgery

## 2023-12-21 ENCOUNTER — Other Ambulatory Visit: Payer: Self-pay | Admitting: General Surgery

## 2023-12-21 ENCOUNTER — Other Ambulatory Visit: Payer: Self-pay

## 2023-12-21 ENCOUNTER — Inpatient Hospital Stay
Admission: RE | Admit: 2023-12-21 | Discharge: 2023-12-21 | Disposition: A | Discharge status: Home / Self Care | Source: Ambulatory Visit | Attending: General Surgery | Admitting: General Surgery

## 2023-12-21 DIAGNOSIS — C50411 Malignant neoplasm of upper-outer quadrant of right female breast: Secondary | ICD-10-CM

## 2023-12-21 DIAGNOSIS — R293 Abnormal posture: Secondary | ICD-10-CM | POA: Insufficient documentation

## 2023-12-21 DIAGNOSIS — Z170 Estrogen receptor positive status [ER+]: Secondary | ICD-10-CM | POA: Diagnosis present

## 2023-12-21 HISTORY — PX: BREAST BIOPSY: SHX20

## 2023-12-21 MED ORDER — CHLORHEXIDINE GLUCONATE CLOTH 2 % EX PADS: 6.0000 | MEDICATED_PAD | Freq: Once | CUTANEOUS | Status: DC

## 2023-12-21 NOTE — Progress Notes (Signed)

## 2023-12-21 NOTE — Therapy (Signed)
 OUTPATIENT PHYSICAL THERAPY BREAST CANCER BASELINE EVALUATION   Patient Name: Melinda Forbes MRN: [id number] DOB:1965-05-10, 58 y.o., female Today's Date: 12/21/2023  END OF SESSION:  PT End of Session - 12/21/23 1457     Visit Number 1    Number of Visits 2    Date for PT Re-Evaluation 02/01/24    PT Start Time 1426   pt arrived late   PT Stop Time 1455    PT Time Calculation (min) 29 min    Activity Tolerance Patient tolerated treatment well    Behavior During Therapy [facility] for tasks assessed/performed          Past Medical History:  Diagnosis Date   Bursitis left hip   Chronic throat clearing    ENT eval 2008 by Dr. Ethyl showed normal vocal cords and  slight edema of the arytenoid mucosa possibly indicative of globus type sx's and possible reflux symptoms.   Complication of anesthesia    COVID 03/04/2020   congestion loss of taste and smell x 7 days, took monoclonal antibody infusion   GERD (gastroesophageal reflux disease)    omeprazole  daily helps   Hashimoto's thyroiditis    History of bronchitis    Hypothyroidism    managed by endocrinologist ([name])   Pelvic congestion syndrome    on left side   Perforation bowel (HCC)    colon perforation 11-30-2019 hospitialized, and march 2022   Pericardial effusion    PONV (postoperative nausea and vomiting)    nausea   Wears glasses    for reading   Past Surgical History:  Procedure Laterality Date   BREAST BIOPSY Right 12/09/2023   US  RT BREAST BX W LOC DEV 1ST LESION IMG BX SPEC US  GUIDE 12/09/2023 [facility]   BREAST BIOPSY  12/21/2023   US  RT RADIOACTIVE SEED LOC 12/21/2023 [facility]   CESAREAN SECTION  1988   first pregnancy.  Her second delivery was vaginal.   colonscopy  01/2020   x 3   CYST EXCISION  1999   Epidermal inclusion cyst in right groin (Dr. Mikell)   GROIN DEBRIDEMENT  1999   cyst removed   LAPAROSCOPY N/A 08/22/2020   Procedure: LAPAROSCOPY OPERATIVE, LYSIS OF  ADHESIONS;  Surgeon: [name], MD;  Location: [facility] [place];  Service: Gynecology;  Laterality: N/A;   MYRINGOTOMY WITH TUBE PLACEMENT Left 08/2022   Patient Active Problem List   Diagnosis Date Noted   Malignant neoplasm of upper-outer quadrant of right breast in female, estrogen receptor positive (HCC) 12/20/2023   Impacted cerumen of left ear 12/07/2023   Chronic rhinitis 01/19/2023   Other specified disorders of eustachian tube, left ear 01/19/2023   Precordial pain 06/04/2022   Pericardial effusion 08/08/2020   Atypical chest pain 08/08/2020   History of bronchitis    Elevated blood pressure reading    Chronic throat clearing    Hypokalemia 12/01/2019   Abscess 12/01/2019   Proctocolitis with abscess 11/30/2019   Pain in left hip 10/27/2018   History of recurrent UTIs 01/06/2012   Hypothyroidism    GERD (gastroesophageal reflux disease)     PCP: Alberta Sharps, MD  REFERRING PROVIDER:  [name], MD  REFERRING DIAG: C50.411,Z17.0 (ICD-10-CM) - Malignant neoplasm of upper-outer quadrant of right breast in female, estrogen receptor positive (HCC)   THERAPY DIAG:  Abnormal posture - Plan: PT plan of care cert/re-cert  Malignant neoplasm of upper-outer quadrant of right breast in female, estrogen receptor positive (HCC) -  Plan: PT plan of care cert/re-cert  Rationale for Evaluation and Treatment: Rehabilitation  ONSET DATE: 12/10/23  SUBJECTIVE:                                                                                                                                                                                           SUBJECTIVE STATEMENT: Patient reports she is here today to be seen by her medical team for her newly diagnosed right breast cancer.   PERTINENT HISTORY:  Patient was diagnosed on 12/10/23 with right grade 2. It measures 1.6 cm and is located in the upper-outer quadrant. It is ER/PR positive, HER2 - with a Ki67 of 20%.    PATIENT GOALS:   reduce lymphedema risk and learn post op HEP.   PAIN:  Are you having pain? No just soreness from biopsy  PRECAUTIONS: Active CA   RED FLAGS: None   HAND DOMINANCE: right  WEIGHT BEARING RESTRICTIONS: No  FALLS:  Has patient fallen in last 6 months? 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  COGNITION: Overall cognitive status: Within functional limits for tasks assessed    POSTURE:  Forward head and rounded shoulders posture  UPPER EXTREMITY AROM/PROM:  A/PROM RIGHT   eval   Shoulder extension 76  Shoulder flexion 175  Shoulder abduction 170  Shoulder internal rotation 51  Shoulder external rotation 86    (Blank rows = not tested)  A/PROM LEFT   eval  Shoulder extension 71  Shoulder flexion 165  Shoulder abduction 170  Shoulder internal rotation 56  Shoulder external rotation 90    (Blank rows = not tested)  CERVICAL AROM: All within normal limits:    Percent limited  Flexion WFL  Extension WFL  Right lateral flexion WFL  Left lateral flexion WFL  Right rotation WFL  Left rotation WFL    UPPER EXTREMITY STRENGTH: 5/5  LYMPHEDEMA ASSESSMENTS (in cm):   LANDMARK RIGHT   eval  10 cm proximal to olecranon process 25  Olecranon process 23.3  10 cm proximal to ulnar styloid process 20.5  Just proximal to ulnar styloid process 15  Across hand at thumb web space 18.7  At base of 2nd digit 6  (Blank rows = not tested)  LANDMARK LEFT   eval  10 cm proximal to olecranon process 25.7  Olecranon process 23.5  10 cm  proximal to ulnar styloid process 20  Just proximal to ulnar styloid process 14.5  Across hand at  thumb web space 18  At base of 2nd digit 5.6  (Blank rows = not tested)  L-DEX LYMPHEDEMA SCREENING:  The patient was assessed using the L-Dex machine today to produce a lymphedema index baseline score. The patient will be reassessed on a regular basis (typically every 3 months) to obtain new L-Dex scores. If the score is > 6.5 points away from his/her baseline score indicating onset of subclinical lymphedema, it will be recommended to wear a compression garment for 4 weeks, 12 hours per day and then be reassessed. If the score continues to be > 6.5 points from baseline at reassessment, we will initiate lymphedema treatment. Assessing in this manner has a 95% rate of preventing clinically significant lymphedema.   L-DEX FLOWSHEETS - 12/21/23 1400       L-DEX LYMPHEDEMA SCREENING   Measurement Type Unilateral    L-DEX MEASUREMENT EXTREMITY Upper Extremity    POSITION  Standing    DOMINANT SIDE Right    At Risk Side Right    BASELINE SCORE (UNILATERAL) -2.5          QUICK DASH SURVEY:  Junie Palin - 12/21/23 0001     Open a tight or new jar No difficulty    Do heavy household chores (wash walls, wash floors) No difficulty    Carry a shopping bag or briefcase No difficulty    Wash your back No difficulty    Use a knife to cut food No difficulty    Recreational activities in which you take some force or impact through your arm, shoulder, or hand (golf, hammering, tennis) No difficulty    During the past week, to what extent has your arm, shoulder or hand problem interfered with your normal social activities with family, friends, neighbors, or groups? Not at all    During the past week, to what extent has your arm, shoulder or hand problem limited your work or other regular daily activities Not at all    Arm, shoulder, or hand pain. None    Tingling (pins and needles) in your arm, shoulder, or hand None    Difficulty Sleeping No difficulty    DASH Score 0 %           PATIENT  EDUCATION:  Education details: Time spent educating patient on aspects of self-care to maximize post op recovery. Patient was educated on where and how to get a post op compression bra to use to reduce post op edema. Patient was also educated on the use of SOZO screenings and surveillance principles for early identification of lymphedema onset. She was instructed to use the post op pillow in the axilla for pressure and pain relief. Patient educated on lymphedema risk reduction and post op shoulder/posture HEP. Person educated: Patient Education method: Explanation, Demonstration, Handout Education comprehension: Patient verbalized understanding and returned demonstration  HOME EXERCISE PROGRAM: Patient was instructed today in a home exercise program today for post op shoulder range of motion. These included active assist shoulder flexion in sitting, scapular retraction, wall walking with shoulder abduction, and hands behind head external rotation.  She was encouraged to do these twice a day, holding 3 seconds and repeating 5 times when permitted by her physician.   ASSESSMENT:  CLINICAL IMPRESSION: Pt reports to PT with newly diagnosed R breast cancer. She will be undergoing a R lumpectomy and SLNB on 12/23/23 followed by radiation. She  will benefit from a post op PT reassessment to determine needs and from L-Dex screens every 3 months for 2 years to detect subclinical lymphedema.  Pt will benefit from skilled therapeutic intervention to improve on the following deficits: Decreased knowledge of precautions, impaired UE functional use, pain, decreased ROM, postural dysfunction.   PT treatment/interventions: ADL/self-care home management, pt/family education, therapeutic exercise  REHAB POTENTIAL: Good  CLINICAL DECISION MAKING: Stable/uncomplicated  EVALUATION COMPLEXITY: Low   GOALS: Goals reviewed with patient? YES  LONG TERM GOALS: (STG=LTG)    Name Target Date Goal status  1 Pt will  be able to verbalize understanding of pertinent lymphedema risk reduction practices relevant to her dx specifically related to skin care.  Baseline:  No knowledge 12/21/2023 Achieved at eval  2 Pt will be able to return demo and/or verbalize understanding of the post op HEP related to regaining shoulder ROM. Baseline:  No knowledge 12/21/2023 Achieved at eval  3 Pt will be able to verbalize understanding of the importance of viewing the post op After Breast CA Class video for further lymphedema risk reduction education and therapeutic exercise.  Baseline:  No knowledge 12/21/2023 Achieved at eval  4 Pt will demo she has regained full shoulder ROM and function post operatively compared to baselines.  Baseline: See objective measurements taken today. 02/01/24  NEW    PLAN:  PT FREQUENCY/DURATION: EVAL and 1 follow up appointment.   PLAN FOR NEXT SESSION: will reassess 3-4 weeks post op to determine needs.   Patient will follow up at outpatient cancer rehab 3-4 weeks following surgery.  If the patient requires physical therapy at that time, a specific plan will be dictated and sent to the referring physician for approval. The patient was educated today on appropriate basic range of motion exercises to begin post operatively and the importance of viewing the After Breast Cancer class video following surgery.  Patient was educated today on lymphedema risk reduction practices as it pertains to recommendations that will benefit the patient immediately following surgery.  She verbalized good understanding.    Physical Therapy Information for After Breast Cancer Surgery/Treatment:  Lymphedema is a swelling condition that you may be at risk for in your arm if you have lymph nodes removed from the armpit area.  After a sentinel node biopsy, the risk is approximately 5-9% and is higher after an axillary node dissection.  There is treatment available for this condition and it is not life-threatening.  Contact your  physician or physical therapist with concerns. You may begin the 4 shoulder/posture exercises (see additional sheet) when permitted by your physician (typically a week after surgery).  If you have drains, you may need to wait until those are removed before beginning range of motion exercises.  A general recommendation is to not lift your arms above shoulder height until drains are removed.  These exercises should be done to your tolerance and gently.  This is not a no pain/no gain type of recovery so listen to your body and stretch into the range of motion that you can tolerate, stopping if you have pain.  If you are having immediate reconstruction, ask your plastic surgeon about doing exercises as he or she may want you to wait. We encourage you to view the After Breast Cancer class video following surgery.  You will learn information related to lymphedema risk, prevention and treatment and additional exercises to regain mobility following surgery.   While undergoing any medical procedure or treatment, try to avoid blood  pressure being taken or needle sticks from occurring on the arm on the side of cancer.   This recommendation begins after surgery and continues for the rest of your life.  This may help reduce your risk of getting lymphedema (swelling in your arm). An excellent resource for those seeking information on lymphedema is the National Lymphedema Network's web site. It can be accessed at www.lymphnet.org If you notice swelling in your hand, arm or breast at any time following surgery (even if it is many years from now), please contact your doctor or physical therapist to discuss this.  Lymphedema can be treated at any time but it is easier for you if it is treated early on.  If you feel like your shoulder motion is not returning to normal in a reasonable amount of time, please contact your surgeon or physical therapist.  [facility] [phone]. [address]  [address], [place] [address]  [facility] CLASS After Breast Cancer Class  After Breast Cancer Class is a specially designed exercise class video to assist you in a safe recover after having breast cancer surgery.  In this video you will learn how to get back to full function whether your drains were just removed or if you had surgery a month ago. The video can be viewed on this page: [email or web address].boyd-meyer.org/ or on YouTube here: https://youtu.[email or web address].  Class Goals  Understand specific stretches to improve the flexibility of you chest and shoulder. Learn ways to safely strengthen your upper body and improve your posture. Understand the warning signs of infection and why you may be at risk for an arm infection. Learn about Lymphedema and prevention.  ** You do not need to view this video until after surgery.  Drains should be removed to participate in the recommended exercises on the video.  Patient was instructed today in a home exercise program today for post op shoulder range of motion. These included active assist shoulder flexion in sitting, scapular retraction, wall walking with shoulder abduction, and hands behind head external rotation.  She was encouraged to do these twice a day, holding 3 seconds and repeating 5 times when permitted by her physician.    [facility], PT 12/21/2023, 3:01 PM

## 2023-12-22 ENCOUNTER — Encounter (HOSPITAL_BASED_OUTPATIENT_CLINIC_OR_DEPARTMENT_OTHER): Payer: Self-pay | Admitting: General Surgery

## 2023-12-22 NOTE — Anesthesia Preprocedure Evaluation (Signed)
 Anesthesia Evaluation  Patient identified by MRN, date of birth, ID band Patient awake    Reviewed: Allergy & Precautions, NPO status , Patient's Chart, lab work & pertinent test results  History of Anesthesia Complications (+) PONV and history of anesthetic complications  Airway Mallampati: II  TM Distance: >3 FB Neck ROM: Full   Comment:                                                                                               Dental no notable dental hx.    Pulmonary neg pulmonary ROS   Pulmonary exam normal        Cardiovascular negative cardio ROS Normal cardiovascular exam     Neuro/Psych negative neurological ROS     GI/Hepatic Neg liver ROS,GERD  Medicated,,  Endo/Other  Hypothyroidism    Renal/GU negative Renal ROS     Musculoskeletal negative musculoskeletal ROS (+)    Abdominal   Peds  Hematology negative hematology ROS (+)   Anesthesia Other Findings RIGHT BREAST CANCER  Reproductive/Obstetrics                              Anesthesia Physical Anesthesia Plan  ASA: 2  Anesthesia Plan: General   Post-op Pain Management: Tylenol  PO (pre-op)* and Regional block*   Induction: Intravenous  PONV Risk Score and Plan: 4 or greater and Treatment may vary due to age or medical condition, Ondansetron , Dexamethasone , Midazolam , TIVA and Propofol  infusion  Airway Management Planned: LMA  Additional Equipment: None  Intra-op Plan:   Post-operative Plan: Extubation in OR  Informed Consent: I have reviewed the patients History and Physical, chart, labs and discussed the procedure including the risks, benefits and alternatives for the proposed anesthesia with the patient or authorized representative who has indicated his/her understanding and acceptance.     Dental advisory  given  Plan Discussed with: CRNA  Anesthesia Plan Comments:          Anesthesia Quick Evaluation

## 2023-12-23 ENCOUNTER — Ambulatory Visit
Admission: RE | Admit: 2023-12-23 | Discharge: 2023-12-23 | Disposition: A | Discharge status: Home / Self Care | Source: Ambulatory Visit | Attending: General Surgery | Admitting: General Surgery

## 2023-12-23 ENCOUNTER — Encounter (HOSPITAL_COMMUNITY): Payer: Self-pay

## 2023-12-23 ENCOUNTER — Encounter (HOSPITAL_BASED_OUTPATIENT_CLINIC_OR_DEPARTMENT_OTHER)
Admission: RE | Disposition: A | Discharge status: Home / Self Care | Payer: Self-pay | Source: Home / Self Care | Attending: General Surgery

## 2023-12-23 ENCOUNTER — Encounter (HOSPITAL_BASED_OUTPATIENT_CLINIC_OR_DEPARTMENT_OTHER): Payer: Self-pay | Admitting: Certified Registered Nurse Anesthetist

## 2023-12-23 ENCOUNTER — Encounter (HOSPITAL_BASED_OUTPATIENT_CLINIC_OR_DEPARTMENT_OTHER): Payer: Self-pay | Admitting: General Surgery

## 2023-12-23 ENCOUNTER — Encounter (HOSPITAL_COMMUNITY)
Admission: RE | Admit: 2023-12-23 | Discharge: 2023-12-23 | Disposition: A | Discharge status: Home / Self Care | Source: Ambulatory Visit | Attending: General Surgery | Admitting: General Surgery

## 2023-12-23 ENCOUNTER — Ambulatory Visit (HOSPITAL_BASED_OUTPATIENT_CLINIC_OR_DEPARTMENT_OTHER): Payer: Self-pay | Admitting: Certified Registered Nurse Anesthetist

## 2023-12-23 ENCOUNTER — Other Ambulatory Visit: Payer: Self-pay

## 2023-12-23 ENCOUNTER — Ambulatory Visit (HOSPITAL_BASED_OUTPATIENT_CLINIC_OR_DEPARTMENT_OTHER)
Admission: RE | Admit: 2023-12-23 | Discharge: 2023-12-23 | Disposition: A | Discharge status: Home / Self Care | Attending: General Surgery | Admitting: General Surgery

## 2023-12-23 DIAGNOSIS — C773 Secondary and unspecified malignant neoplasm of axilla and upper limb lymph nodes: Secondary | ICD-10-CM | POA: Insufficient documentation

## 2023-12-23 DIAGNOSIS — E039 Hypothyroidism, unspecified: Secondary | ICD-10-CM | POA: Insufficient documentation

## 2023-12-23 DIAGNOSIS — Z170 Estrogen receptor positive status [ER+]: Secondary | ICD-10-CM

## 2023-12-23 DIAGNOSIS — Z1721 Progesterone receptor positive status: Secondary | ICD-10-CM | POA: Insufficient documentation

## 2023-12-23 DIAGNOSIS — Z01818 Encounter for other preprocedural examination: Secondary | ICD-10-CM

## 2023-12-23 DIAGNOSIS — C50411 Malignant neoplasm of upper-outer quadrant of right female breast: Secondary | ICD-10-CM | POA: Insufficient documentation

## 2023-12-23 DIAGNOSIS — Z803 Family history of malignant neoplasm of breast: Secondary | ICD-10-CM | POA: Insufficient documentation

## 2023-12-23 DIAGNOSIS — C50911 Malignant neoplasm of unspecified site of right female breast: Secondary | ICD-10-CM | POA: Diagnosis not present

## 2023-12-23 DIAGNOSIS — K219 Gastro-esophageal reflux disease without esophagitis: Secondary | ICD-10-CM | POA: Insufficient documentation

## 2023-12-23 DIAGNOSIS — Z1732 Human epidermal growth factor receptor 2 negative status: Secondary | ICD-10-CM | POA: Diagnosis not present

## 2023-12-23 HISTORY — PX: BREAST LUMPECTOMY WITH RADIOACTIVE SEED AND SENTINEL LYMPH NODE BIOPSY: SHX6550

## 2023-12-23 SURGERY — BREAST LUMPECTOMY WITH RADIOACTIVE SEED AND SENTINEL LYMPH NODE BIOPSY
Anesthesia: General | Site: Breast | Laterality: Right

## 2023-12-23 MED ORDER — EPHEDRINE SULFATE-NACL 50-0.9 MG/10ML-% IV SOSY
PREFILLED_SYRINGE | INTRAVENOUS | Status: DC | PRN
  Administered 2023-12-23: 10 mg via INTRAVENOUS

## 2023-12-23 MED ORDER — ACETAMINOPHEN 500 MG PO TABS
1000.0000 mg | ORAL_TABLET | ORAL | Status: AC
  Administered 2023-12-23: 1000 mg via ORAL

## 2023-12-23 MED ORDER — GABAPENTIN 100 MG PO CAPS
100.0000 mg | ORAL_CAPSULE | ORAL | Status: AC
Start: 2023-12-23 — End: 2023-12-23
  Administered 2023-12-23: 100 mg via ORAL

## 2023-12-23 MED ORDER — LACTATED RINGERS IV SOLN: INTRAVENOUS | Status: DC

## 2023-12-23 MED ORDER — FENTANYL CITRATE (PF) 100 MCG/2ML IJ SOLN
50.0000 ug | Freq: Once | INTRAMUSCULAR | Status: AC
  Administered 2023-12-23: 50 ug via INTRAVENOUS

## 2023-12-23 MED ORDER — TECHNETIUM TC 99M TILMANOCEPT KIT
1.0000 | PACK | Freq: Once | INTRAVENOUS | Status: AC
  Administered 2023-12-23: 1 via INTRADERMAL

## 2023-12-23 MED ORDER — ONDANSETRON HCL 4 MG/2ML IJ SOLN
INTRAMUSCULAR | Status: DC | PRN
  Administered 2023-12-23: 4 mg via INTRAVENOUS

## 2023-12-23 MED ORDER — ACETAMINOPHEN 500 MG PO TABS: 1000.0000 mg | ORAL_TABLET | Freq: Once | ORAL | Status: AC

## 2023-12-23 MED ORDER — BUPIVACAINE-EPINEPHRINE (PF) 0.5% -1:200000 IJ SOLN
INTRAMUSCULAR | Status: DC | PRN
  Administered 2023-12-23: 25 mL via PERINEURAL

## 2023-12-23 MED ORDER — SCOPOLAMINE 1 MG/3DAYS TD PT72
MEDICATED_PATCH | TRANSDERMAL | Status: AC
Start: 2023-12-23 — End: 2023-12-23
  Filled 2023-12-23: qty 1

## 2023-12-23 MED ORDER — FENTANYL CITRATE (PF) 100 MCG/2ML IJ SOLN: 25.0000 ug | INTRAMUSCULAR | Status: DC | PRN

## 2023-12-23 MED ORDER — MIDAZOLAM HCL 2 MG/2ML IJ SOLN
2.0000 mg | Freq: Once | INTRAMUSCULAR | Status: AC
  Administered 2023-12-23: 2 mg via INTRAVENOUS

## 2023-12-23 MED ORDER — OXYCODONE HCL 5 MG/5ML PO SOLN: 5.0000 mg | Freq: Once | ORAL | Status: DC | PRN

## 2023-12-23 MED ORDER — CEFAZOLIN SODIUM-DEXTROSE 2-3 GM-%(50ML) IV SOLR
INTRAVENOUS | Status: DC | PRN
  Administered 2023-12-23: 2 g via INTRAVENOUS

## 2023-12-23 MED ORDER — PROPOFOL 10 MG/ML IV BOLUS
INTRAVENOUS | Status: DC | PRN
  Administered 2023-12-23: 120 ug via INTRAVENOUS

## 2023-12-23 MED ORDER — CEFAZOLIN SODIUM-DEXTROSE 2-4 GM/100ML-% IV SOLN
INTRAVENOUS | Status: AC
Start: 2023-12-23 — End: 2023-12-23
  Filled 2023-12-23: qty 100

## 2023-12-23 MED ORDER — DEXMEDETOMIDINE HCL IN NACL 80 MCG/20ML IV SOLN
INTRAVENOUS | Status: DC | PRN
  Administered 2023-12-23 (×2): 4 ug via INTRAVENOUS

## 2023-12-23 MED ORDER — PROPOFOL 500 MG/50ML IV EMUL
INTRAVENOUS | Status: DC | PRN
  Administered 2023-12-23: 200 ug/kg/min via INTRAVENOUS
  Administered 2023-12-23: 150 ug/kg/min via INTRAVENOUS

## 2023-12-23 MED ORDER — MIDAZOLAM HCL 2 MG/2ML IJ SOLN
INTRAMUSCULAR | Status: AC
  Filled 2023-12-23: qty 2

## 2023-12-23 MED ORDER — DEXMEDETOMIDINE HCL IN NACL 80 MCG/20ML IV SOLN
INTRAVENOUS | Status: AC
Start: 2023-12-23 — End: 2023-12-23
  Filled 2023-12-23: qty 20

## 2023-12-23 MED ORDER — FENTANYL CITRATE (PF) 100 MCG/2ML IJ SOLN
INTRAMUSCULAR | Status: AC
  Filled 2023-12-23: qty 2

## 2023-12-23 MED ORDER — BUPIVACAINE-EPINEPHRINE (PF) 0.25% -1:200000 IJ SOLN
INTRAMUSCULAR | Status: AC
Start: 2023-12-23 — End: 2023-12-23
  Filled 2023-12-23: qty 30

## 2023-12-23 MED ORDER — FENTANYL CITRATE (PF) 100 MCG/2ML IJ SOLN
INTRAMUSCULAR | Status: DC | PRN
  Administered 2023-12-23 (×2): 50 ug via INTRAVENOUS

## 2023-12-23 MED ORDER — GABAPENTIN 100 MG PO CAPS
ORAL_CAPSULE | ORAL | Status: AC
  Filled 2023-12-23: qty 1

## 2023-12-23 MED ORDER — CEFAZOLIN SODIUM-DEXTROSE 2-4 GM/100ML-% IV SOLN: 2.0000 g | INTRAVENOUS | Status: DC

## 2023-12-23 MED ORDER — DEXAMETHASONE SODIUM PHOSPHATE 10 MG/ML IJ SOLN
INTRAMUSCULAR | Status: DC | PRN
  Administered 2023-12-23: 10 mg via INTRAVENOUS

## 2023-12-23 MED ORDER — BUPIVACAINE-EPINEPHRINE 0.25% -1:200000 IJ SOLN
INTRAMUSCULAR | Status: DC | PRN
  Administered 2023-12-23: 20 mL

## 2023-12-23 MED ORDER — ACETAMINOPHEN 500 MG PO TABS
ORAL_TABLET | ORAL | Status: AC
  Filled 2023-12-23: qty 2

## 2023-12-23 MED ORDER — TRAMADOL HCL 50 MG PO TABS: 50.0000 mg | ORAL_TABLET | Freq: Four times a day (QID) | ORAL | 0 refills | Status: DC | PRN

## 2023-12-23 MED ORDER — DROPERIDOL 2.5 MG/ML IJ SOLN
INTRAMUSCULAR | Status: AC
  Filled 2023-12-23: qty 2

## 2023-12-23 MED ORDER — OXYCODONE HCL 5 MG PO TABS: 5.0000 mg | ORAL_TABLET | Freq: Once | ORAL | Status: DC | PRN

## 2023-12-23 MED ORDER — LIDOCAINE 2% (20 MG/ML) 5 ML SYRINGE
INTRAMUSCULAR | Status: DC | PRN
  Administered 2023-12-23: 60 mg via INTRAVENOUS

## 2023-12-23 MED ORDER — DROPERIDOL 2.5 MG/ML IJ SOLN
0.6250 mg | Freq: Once | INTRAMUSCULAR | Status: AC | PRN
  Administered 2023-12-23: 0.625 mg via INTRAVENOUS

## 2023-12-23 MED ORDER — PROPOFOL 500 MG/50ML IV EMUL
INTRAVENOUS | Status: AC
  Filled 2023-12-23: qty 50

## 2023-12-23 MED ORDER — SCOPOLAMINE 1 MG/3DAYS TD PT72
1.0000 | MEDICATED_PATCH | TRANSDERMAL | Status: DC
  Administered 2023-12-23: 1 via TRANSDERMAL

## 2023-12-23 SURGICAL SUPPLY — 40 items
BINDER BREAST MEDIUM (GAUZE/BANDAGES/DRESSINGS) IMPLANT
BLADE SURG 15 STRL LF DISP TIS (BLADE) ×1 IMPLANT
CANISTER SUC SOCK COL 7IN (MISCELLANEOUS) IMPLANT
CANISTER SUCT 1200ML W/VALVE (MISCELLANEOUS) IMPLANT
CHLORAPREP W/TINT 26 (MISCELLANEOUS) ×1 IMPLANT
CLIP APPLIE 9.375 MED OPEN (MISCELLANEOUS) ×1 IMPLANT
COVER BACK TABLE 60X90IN (DRAPES) ×1 IMPLANT
COVER MAYO STAND STRL (DRAPES) ×1 IMPLANT
COVER PROBE CYLINDRICAL 5X96 (MISCELLANEOUS) ×1 IMPLANT
DERMABOND ADVANCED .7 DNX12 (GAUZE/BANDAGES/DRESSINGS) ×1 IMPLANT
DRAPE LAPAROSCOPIC ABDOMINAL (DRAPES) ×1 IMPLANT
DRAPE UTILITY XL STRL (DRAPES) ×1 IMPLANT
ELECT COATED BLADE 2.86 ST (ELECTRODE) ×1 IMPLANT
ELECTRODE REM PT RTRN 9FT ADLT (ELECTROSURGICAL) ×1 IMPLANT
GLOVE BIO SURGEON STRL SZ7.5 (GLOVE) ×1 IMPLANT
GLOVE BIOGEL PI IND STRL 6.5 (GLOVE) IMPLANT
GLOVE BIOGEL PI IND STRL 7.0 (GLOVE) IMPLANT
GLOVE BIOGEL PI IND STRL 7.5 (GLOVE) IMPLANT
GLOVE SURG SS PI 6.5 STRL IVOR (GLOVE) IMPLANT
GLOVE SURG SS PI 7.0 STRL IVOR (GLOVE) IMPLANT
GOWN STRL REUS W/ TWL LRG LVL3 (GOWN DISPOSABLE) ×2 IMPLANT
GOWN STRL REUS W/ TWL XL LVL3 (GOWN DISPOSABLE) IMPLANT
KIT MARKER MARGIN INK (KITS) ×1 IMPLANT
NDL HYPO 25X1 1.5 SAFETY (NEEDLE) ×1 IMPLANT
NDL SAFETY ECLIPSE 18X1.5 (NEEDLE) IMPLANT
NEEDLE HYPO 25X1 1.5 SAFETY (NEEDLE) ×1 IMPLANT
NS IRRIG 1000ML POUR BTL (IV SOLUTION) IMPLANT
PACK BASIN DAY SURGERY FS (CUSTOM PROCEDURE TRAY) ×1 IMPLANT
PENCIL SMOKE EVACUATOR (MISCELLANEOUS) ×1 IMPLANT
SLEEVE SCD COMPRESS KNEE MED (STOCKING) ×1 IMPLANT
SPIKE FLUID TRANSFER (MISCELLANEOUS) IMPLANT
SPONGE T-LAP 18X18 ~~LOC~~+RFID (SPONGE) ×1 IMPLANT
SUT MON AB 4-0 PC3 18 (SUTURE) ×2 IMPLANT
SUT SILK 2 0 SH (SUTURE) IMPLANT
SUT VICRYL 3-0 CR8 SH (SUTURE) ×1 IMPLANT
SYR CONTROL 10ML LL (SYRINGE) ×1 IMPLANT
TOWEL GREEN STERILE FF (TOWEL DISPOSABLE) ×1 IMPLANT
TRAY FAXITRON CT DISP (TRAY / TRAY PROCEDURE) ×1 IMPLANT
TUBE CONNECTING 20X1/4 (TUBING) IMPLANT
YANKAUER SUCT BULB TIP NO VENT (SUCTIONS) IMPLANT

## 2023-12-23 NOTE — Anesthesia Procedure Notes (Signed)
 Procedure Name: LMA Insertion Date/Time: 12/23/2023 11:00 AM  Performed by: Leotha Andrez DEL, CRNAPre-anesthesia Checklist: Patient identified, Emergency Drugs available, Suction available, Patient being monitored and Timeout performed Patient Re-evaluated:Patient Re-evaluated prior to induction Oxygen Delivery Method: Circle system utilized Preoxygenation: Pre-oxygenation with 100% oxygen Induction Type: IV induction Ventilation: Mask ventilation without difficulty LMA: LMA inserted LMA Size: 4.0 Tube size: 4.0 mm Number of attempts: 1 Placement Confirmation: breath sounds checked- equal and bilateral and positive ETCO2 Tube secured with: Tape Dental Injury: Teeth and Oropharynx as per pre-operative assessment

## 2023-12-23 NOTE — Progress Notes (Signed)
Assisted Dr. Howze with right, pectoralis, ultrasound guided block. Side rails up, monitors on throughout procedure. See vital signs in flow sheet. Tolerated Procedure well. 

## 2023-12-23 NOTE — Transfer of Care (Signed)
 Immediate Anesthesia Transfer of Care Note  Patient: Melinda Forbes  Procedure(s) Performed: RIGHT BREAST LUMPECTOMY WITH RADIOACTIVE SEED AND SENTINEL LYMPH NODE BIOPSY (Right: Breast)  Patient Location: PACU  Anesthesia Type:General  Level of Consciousness: drowsy  Airway & Oxygen Therapy: Patient Spontanous Breathing and Patient connected to face mask oxygen  Post-op Assessment: Report given to RN and Post -op Vital signs reviewed and stable  Post vital signs: Reviewed and stable  Last Vitals:  Vitals Value Taken Time  BP 100/59 12/23/23 12:30  Temp 36.4 C 12/23/23 12:30  Pulse 68 12/23/23 12:38  Resp 9 12/23/23 12:38  SpO2 100 % 12/23/23 12:38  Vitals shown include unfiled device data.  Last Pain:  Vitals:   12/23/23 1230  TempSrc:   PainSc: Asleep      Patients Stated Pain Goal: 2 (12/23/23 0857)  Complications: No notable events documented.

## 2023-12-23 NOTE — Interval H&P Note (Signed)
 History and Physical Interval Note:  12/23/2023 8:54 AM  Melinda Forbes  has presented today for surgery, with the diagnosis of RIGHT BREAST CANCER.  The various methods of treatment have been discussed with the patient and family. After consideration of risks, benefits and other options for treatment, the patient has consented to  Procedure(s) with comments: BREAST LUMPECTOMY WITH RADIOACTIVE SEED AND SENTINEL LYMPH NODE BIOPSY (Right) - RIGHT BREAST RADIOACTIVE SEED LOCALIZED LUMPECTOMY SENTINEL NODE BIOPSY as a surgical intervention.  The patient's history has been reviewed, patient examined, no change in status, stable for surgery.  I have reviewed the patient's chart and labs.  Questions were answered to the patient's satisfaction.     Deward Null III

## 2023-12-23 NOTE — H&P (Signed)
 REFERRING PHYSICIAN: Claudene Alberta Kingsley, * PROVIDER: DEWARD GARNETTE NULL, MD MRN: [id number] DOB: September 19, 1965 Subjective   Chief Complaint: New Consultation ( - Rt breast cancer )  History of Present Illness: Melinda Forbes is a 58 y.o. female who is seen today as an office consultation for evaluation of New Consultation ( - Rt breast cancer )  We are asked to see the patient in consultation by Dr. Alberta Claudene to evaluate her for a new right breast cancer. The patient is a 58 year old white female who recently felt a mass in the outer central right breast. She had a negative mammogram back in March. She was evaluated with mammogram and ultrasound and found to have a 1.6 cm mass in the lateral central right breast. The lymph nodes looked normal. The mass was biopsied and came back as a grade 2 invasive ductal cancer that was ER and PR positive and HER2 negative with a Ki-67 of 20%. She is in good health and does not smoke. She does have a family history of breast cancer in her grandmother at the age of 87. She had genetic testing earlier this year as well that was negative  Review of Systems: A complete review of systems was obtained from the patient. I have reviewed this information and discussed as appropriate with the patient. See HPI as well for other ROS.  ROS   Medical History: Past Medical History:  Diagnosis Date  Anxiety  GERD (gastroesophageal reflux disease)  History of cancer  Thyroid  disease   Patient Active Problem List  Diagnosis  Malignant neoplasm of upper-outer quadrant of right breast in female, estrogen receptor positive (CMS/HHS-HCC)   Past Surgical History:  Procedure Laterality Date  .csection  .hand surgery  .scar tissue adhesions    Allergies  Allergen Reactions  Codeine Vomiting and Other (See Comments)  Other reaction(s): intense vomiting, Other (See Comments)   Current Outpatient Medications on File Prior to Visit  Medication Sig Dispense Refill   biotin 5,000 mcg TbDL  busPIRone (BUSPAR) 10 MG tablet 1 tablet Orally every 12 hours; Duration: 90 days  calcium-vitamin D3-vitamin K 500 mg-1,000 unit-40 mcg Chew 1 tablet Orally once a day  cranberry-vitamin C-mannose 250-30-50 mg Chew  cycloSPORINE (RESTASIS) 0.05 % ophthalmic emulsion Place 1 drop into both eyes 2 (two) times daily  docusate (COLACE) 100 MG capsule 1 capsule as needed Orally Once a day  esomeprazole  (NEXIUM ) 20 MG DR capsule at bedtime  famotidine  (PEPCID ) 20 MG tablet  fluticasone propionate (FLONASE) 50 mcg/actuation nasal spray USE 1-2 SPRAYS IN EACH NOSTRIL DAILY  levothyroxine  (SYNTHROID ) 88 MCG tablet 1 tablet Orally every morning on an empty stomach; Duration: 90 days  multivit with calcium,iron,min (MULTIVITAMIN-CA-IRON-MINERALS ORAL)  psyllium husk 0.4 gram Cap Take 0.52 g by mouth  turmeric 400 mg Cap  zinc gluconate 50 mg tablet Take by mouth   No current facility-administered medications on file prior to visit.   Family History  Problem Relation Age of Onset  Breast cancer Mother  Skin cancer Father  High blood pressure (Hypertension) Father  Hyperlipidemia (Elevated cholesterol) Father  Coronary Artery Disease (Blocked arteries around heart) Father    Social History   Tobacco Use  Smoking Status Never  Smokeless Tobacco Never    Social History   Socioeconomic History  Marital status: Married  Tobacco Use  Smoking status: Never  Smokeless tobacco: Never  Vaping Use  Vaping status: Unknown  Substance and Sexual Activity  Alcohol use: Yes  Alcohol/week: 0.0 -  1.0 standard drinks of alcohol  Drug use: Never   Social Drivers of Health   Housing Stability: Unknown (12/17/2023)  Housing Stability Vital Sign  Homeless in the Last Year: No   Objective:   Vitals:  BP: 123/77  Pulse: 70  Resp: 16  Temp: 36.8 C (98.3 F)  SpO2: 97%  Weight: 56.8 kg (125 lb 3.2 oz)  Height: 160 cm (5' 3)  PainSc: 2  PainLoc: Breast   Body mass  index is 22.18 kg/m.  Physical Exam Vitals reviewed.  Constitutional:  General: She is not in acute distress. Appearance: Normal appearance.  HENT:  Head: Normocephalic and atraumatic.  Right Ear: External ear normal.  Left Ear: External ear normal.  Nose: Nose normal.  Mouth/Throat:  Mouth: Mucous membranes are moist.  Pharynx: Oropharynx is clear.  Eyes:  General: No scleral icterus. Extraocular Movements: Extraocular movements intact.  Conjunctiva/sclera: Conjunctivae normal.  Pupils: Pupils are equal, round, and reactive to light.  Cardiovascular:  Rate and Rhythm: Normal rate and regular rhythm.  Pulses: Normal pulses.  Heart sounds: Normal heart sounds.  Pulmonary:  Effort: Pulmonary effort is normal. No respiratory distress.  Breath sounds: Normal breath sounds.  Abdominal:  General: Bowel sounds are normal.  Palpations: Abdomen is soft.  Tenderness: There is no abdominal tenderness.  Musculoskeletal:  General: No swelling, tenderness or deformity. Normal range of motion.  Cervical back: Normal range of motion and neck supple.  Skin: General: Skin is warm and dry.  Coloration: Skin is not jaundiced.  Neurological:  General: No focal deficit present.  Mental Status: She is alert and oriented to person, place, and time.  Psychiatric:  Mood and Affect: Mood normal.  Behavior: Behavior normal.     Breast: There is some palpable fullness in the lateral aspect of the right breast. There is no palpable mass in the left breast. There is no palpable axillary, supraclavicular, or cervical lymphadenopathy.  Labs, Imaging and Diagnostic Testing:  Assessment and Plan:   Diagnoses and all orders for this visit:  Malignant neoplasm of upper-outer quadrant of right breast in female, estrogen receptor positive (CMS/HHS-HCC) - Ambulatory Referral to Oncology-Medical - Ambulatory Referral to Physical Therapy - Ambulatory Referral to Radiation Oncology - [facility] Case Posting  Request; Future   The patient appears to have a 1.6 cm cancer in the lateral aspect of the right breast with clinically negative nodes and all favorable markers. I have discussed with her in detail the different options for treatment and at this point she favors breast conservation which I feel is very reasonable. She is also a good candidate for sentinel node mapping as well. I have discussed with her in detail the risks and benefits of the operation as well as some of the technical aspects including use of a radioactive seed for localization and she understands and wishes to proceed. We will also refer her to medical and radiation oncology to discuss adjuvant therapy. I will also refer her to physical therapy for preoperative lymphedema testing. We will move forward with surgical scheduling.

## 2023-12-23 NOTE — Anesthesia Procedure Notes (Signed)
 Anesthesia Regional Block: Pectoralis block   Pre-Anesthetic Checklist: , timeout performed,  Correct Patient, Correct Site, Correct Laterality,  Correct Procedure, Correct Position, site marked,  Risks and benefits discussed,  Pre-op evaluation,  At surgeon's request and post-op pain management  Laterality: Right  Prep: Maximum Sterile Barrier Precautions used, chloraprep       Needles:  Injection technique: Single-shot  Needle Type: Echogenic Stimulator Needle     Needle Length: 9cm  Needle Gauge: 22     Additional Needles:   Procedures:,,,, ultrasound used (permanent image in chart),,    Narrative:  Start time: 12/23/2023 9:30 AM End time: 12/23/2023 9:33 AM Injection made incrementally with aspirations every 5 mL.  Performed by: Personally  Anesthesiologist: Paul Lamarr BRAVO, MD  Additional Notes: Risks, benefits, and alternative discussed. Patient gave consent for procedure. Patient prepped and draped in sterile fashion. Sedation administered, patient remains easily responsive to voice. Relevant anatomy identified with ultrasound guidance. Local anesthetic given in 5cc increments with no signs or symptoms of intravascular injection. No pain or paraesthesias with injection. Patient monitored throughout procedure with signs of LAST or immediate complications. Tolerated well. Ultrasound image placed in chart.  LANEY Paul, MD

## 2023-12-23 NOTE — Anesthesia Postprocedure Evaluation (Signed)
 Anesthesia Post Note  Patient: IT consultant  Procedure(s) Performed: RIGHT BREAST LUMPECTOMY WITH RADIOACTIVE SEED AND SENTINEL LYMPH NODE BIOPSY (Right: Breast)     Patient location during evaluation: PACU Anesthesia Type: General Level of consciousness: awake and alert Pain management: pain level controlled Vital Signs Assessment: post-procedure vital signs reviewed and stable Respiratory status: spontaneous breathing, nonlabored ventilation and respiratory function stable Cardiovascular status: blood pressure returned to baseline Postop Assessment: no apparent nausea or vomiting Anesthetic complications: no   No notable events documented.  Last Vitals:  Vitals:   12/23/23 1245 12/23/23 1300  BP: 110/63 (!) 102/59  Pulse: 66 64  Resp: (!) 9 10  Temp:    SpO2: 100% 96%    Last Pain:  Vitals:   12/23/23 1300  TempSrc:   PainSc: 0-No pain                 Vertell Row

## 2023-12-23 NOTE — Progress Notes (Signed)
 Emotional support during breast injections

## 2023-12-23 NOTE — Op Note (Signed)
 12/23/2023  12:18 PM  PATIENT:  Melinda Forbes  58 y.o. female  PRE-OPERATIVE DIAGNOSIS:  RIGHT BREAST CANCER  POST-OPERATIVE DIAGNOSIS:  RIGHT BREAST CANCER  PROCEDURE:  Procedure(s) with comments: RIGHT BREAST LUMPECTOMY WITH RADIOACTIVE SEED LOCALIZATION AND DEEP RIGHT AXILLARY SENTINEL LYMPH NODE BIOPSY   SURGEON:  Surgeons and Role:    * [name], MD - Primary  PHYSICIAN ASSISTANT:   ASSISTANTS: none   ANESTHESIA:   local and general  EBL:  Minimal   BLOOD ADMINISTERED:none  DRAINS: none   LOCAL MEDICATIONS USED:  MARCAINE      SPECIMEN:  Source of Specimen:  right breast tissue with additional lateral and anterior margins and sentinel nodes x 3  DISPOSITION OF SPECIMEN:  PATHOLOGY  COUNTS:  YES  TOURNIQUET:  * No tourniquets in log *  DICTATION: .Dragon Dictation  After informed consent was obtained the patient was brought to the operating room and placed in the supine position on the operating table.  After adequate induction of general anesthesia the patient's right chest, breast, and axillary area were prepped with ChloraPrep, allowed to dry, and draped in usual sterile manner.  An appropriate timeout was performed.  Previously an I-125 seed was placed in the lower outer right breast to mark an area of invasive breast cancer.  Also earlier today patient underwent injection of 1 millicurie of technetium sulfur colloid in the subareolar position on the right.  The neoprobe was initially set to technetium.  There was a good signal in the right axilla.  the area overlying this was infiltrated with quarter percent Marcaine .  A small transversely oriented incision was made with a 15 blade knife overlying the area of radioactivity.  The incision was carried through the skin and subcutaneous tissue sharply with the electrocautery until the deep right axillary space was entered.  I was able to identify 3 lymph nodes with signal.  Each was excised sharply with the  electrocautery and the surrounding small vessels and lymphatics were controlled with clips.  Ex vivo counts on these nodes ranged from [phone].  No other hot or palpable nodes were identified in the right axilla.  Hemostasis was achieved using the Bovie electrocautery.  The deep layer of the axilla was then closed with interrupted 3-0 Vicryl stitches.  The skin was closed with a running 4-0 Monocryl subcuticular stitch.  Attention was then turned to the right breast.  The neoprobe was set up to I125 and the area of radioactivity was readily identified.  The area around this is infiltrated with quarter percent Marcaine .  A curvilinear incision was made along the lower outer edge of the areola of the right breast with a 15 blade knife.  Dissection was then carried through the skin and subcutaneous tissue sharply with the electrocautery.  Dissection was then carried towards the radioactive seed under the direction of the neoprobe.  Once I more closely approach the radioactive seed I then removed a circular portion of breast tissue sharply with the electrocautery around the radioactive seed while checking the area of radioactivity frequently.  Once the tissue was removed it was oriented with the appropriate paint colors.  A specimen radiograph was obtained that showed the clip and seed to be near the center of the specimen.  I did elect to take an additional lateral and anterior margin and these were marked appropriately.  All of the tissue was then sent to pathology for further evaluation.  Hemostasis was achieved using the Bovie electrocautery.  The wound was irrigated with saline and infiltrated with more quarter percent Marcaine .  The cavity was marked with clips.  The deep layer of the incision was then closed with layers of interrupted 3-0 Vicryl stitches.  The skin was then closed with interrupted 4-0 Monocryl subcuticular stitches.  Dermabond dressings were applied.  The patient tolerated the procedure well.   At the end of the case, sponge, instrument counts were correct.  The patient was then awakened and taken to recovery in stable condition.  PLAN OF CARE: Discharge to home after PACU  PATIENT DISPOSITION:  PACU - hemodynamically stable.   Delay start of Pharmacological VTE agent (>24hrs) due to surgical blood loss or risk of bleeding: not applicable

## 2023-12-23 NOTE — Discharge Instructions (Addendum)
  Post Anesthesia Home Care Instructions  Activity: Get plenty of rest for the remainder of the day. A responsible individual must stay with you for 24 hours following the procedure.  For the next 24 hours, DO NOT: -Drive a car -Advertising copywriter -Drink alcoholic beverages -Take any medication unless instructed by your physician -Make any legal decisions or sign important papers.  Meals: Start with liquid foods such as gelatin or soup. Progress to regular foods as tolerated. Avoid greasy, spicy, heavy foods. If nausea and/or vomiting occur, drink only clear liquids until the nausea and/or vomiting subsides. Call your physician if vomiting continues.  Special Instructions/Symptoms: Your throat may feel dry or sore from the anesthesia or the breathing tube placed in your throat during surgery. If this causes discomfort, gargle with warm salt water. The discomfort should disappear within 24 hours.  If you had a scopolamine  patch placed behind your ear for the management of post- operative nausea and/or vomiting:  1. The medication in the patch is effective for 72 hours, after which it should be removed.  Wrap patch in a tissue and discard in the trash. Wash hands thoroughly with soap and water. 2. You may remove the patch earlier than 72 hours if you experience unpleasant side effects which may include dry mouth, dizziness or visual disturbances. 3. Avoid touching the patch. Wash your hands with soap and water after contact with the patch.      Regional Anesthesia Blocks  1. You may not be able to move or feel the blocked extremity after a regional anesthetic block. This may last may last from 3-48 hours after placement, but it will go away. The length of time depends on the medication injected and your individual response to the medication. As the nerves start to wake up, you may experience tingling as the movement and feeling returns to your extremity. If the numbness and inability to  move your extremity has not gone away after 48 hours, please call your surgeon.   2. The extremity that is blocked will need to be protected until the numbness is gone and the strength has returned. Because you cannot feel it, you will need to take extra care to avoid injury. Because it may be weak, you may have difficulty moving it or using it. You may not know what position it is in without looking at it while the block is in effect.  3. For blocks in the legs and feet, returning to weight bearing and walking needs to be done carefully. You will need to wait until the numbness is entirely gone and the strength has returned. You should be able to move your leg and foot normally before you try and bear weight or walk. You will need someone to be with you when you first try to ensure you do not fall and possibly risk injury.  4. Bruising and tenderness at the needle site are common side effects and will resolve in a few days.  5. Persistent numbness or new problems with movement should be communicated to the surgeon or the [facility] [phone] [facility] [phone]).  Tylenol  can be taken after 3p if needed

## 2023-12-24 ENCOUNTER — Encounter (HOSPITAL_BASED_OUTPATIENT_CLINIC_OR_DEPARTMENT_OTHER): Payer: Self-pay | Admitting: General Surgery

## 2023-12-24 NOTE — Addendum Note (Signed)
 Addendum  created 12/24/23 0716 by Leotha Andrez DEL, CRNA   Intraprocedure Event edited

## 2023-12-27 ENCOUNTER — Encounter: Payer: Self-pay | Admitting: Radiation Oncology

## 2023-12-27 NOTE — Progress Notes (Signed)
 Radiation Oncology         [phone] ________________________________  Name: Melinda Forbes        MRN: [id number]  Date of Service: 12/28/2023 DOB: 01-Mar-1966  RR:Dfpuy, [place], MD  [name], MD     REFERRING PHYSICIAN: Curvin Deward III, MD   DIAGNOSIS: The encounter diagnosis was Malignant neoplasm of upper-outer quadrant of right breast in female, estrogen receptor positive (HCC).   HISTORY OF PRESENT ILLNESS: Melinda Forbes is a 58 y.o. female seen at the request of Dr. Curvin for newly diagnosed breast cancer.  The patient palpated a mass in her right breast and was seen by her primary care provider who ordered additional diagnostic workup.  The patient had had a normal screening mammogram in March 2025 however when she returned on 12/01/2023 for diagnostic mammogram there was a mass in the 9 o'clock position, anterior outer right breast.  The mass was palpable on examination and by ultrasound measured 1.6 cm in greatest dimension in the 9 o'clock position.  No evidence of adenopathy was appreciated.  She underwent a biopsy on 12/09/2023, and this showed a grade 2 invasive mammary carcinoma.  The tumor was ER/PR positive, HER2 negative with a Ki-67 of 20%.  She met with Dr. Curvin and underwent a right lumpectomy with sentinel lymph node biopsy on 12/23/2023.  Final pathology showed***    PREVIOUS RADIATION THERAPY: {EXAM; YES/NO:19492::No}   PAST MEDICAL HISTORY:  Past Medical History:  Diagnosis Date   Bursitis left hip   Chronic throat clearing    ENT eval 2008 by Dr. Ethyl showed normal vocal cords and  slight edema of the arytenoid mucosa possibly indicative of globus type sx's and possible reflux symptoms.   Complication of anesthesia    COVID 03/04/2020   congestion loss of taste and smell x 7 days, took monoclonal antibody infusion   GERD (gastroesophageal reflux disease)    omeprazole  daily helps   Hashimoto's thyroiditis    History of bronchitis     Hypothyroidism    managed by endocrinologist ([name])   Pelvic congestion syndrome    on left side   Perforation bowel (HCC)    colon perforation 11-30-2019 hospitialized, and march 2022   Pericardial effusion    PONV (postoperative nausea and vomiting)    nausea   Wears glasses    for reading       PAST SURGICAL HISTORY: Past Surgical History:  Procedure Laterality Date   BREAST BIOPSY Right 12/09/2023   US  RT BREAST BX W LOC DEV 1ST LESION IMG BX SPEC US  GUIDE 12/09/2023 [facility]   BREAST BIOPSY  12/21/2023   US  RT RADIOACTIVE SEED LOC 12/21/2023 [facility]   BREAST LUMPECTOMY WITH RADIOACTIVE SEED AND SENTINEL LYMPH NODE BIOPSY Right 12/23/2023   Procedure: RIGHT BREAST LUMPECTOMY WITH RADIOACTIVE SEED AND SENTINEL LYMPH NODE BIOPSY;  Surgeon: [name], MD;  Location: [facility];  Service: General;  Laterality: Right;  RIGHT BREAST RADIOACTIVE SEED LOCALIZED LUMPECTOMY SENTINEL NODE BIOPSY   CESAREAN SECTION  1988   first pregnancy.  Her second delivery was vaginal.   colonscopy  01/2020   x 3   CYST EXCISION  1999   Epidermal inclusion cyst in right groin (Dr. Mikell)   GROIN DEBRIDEMENT  1999   cyst removed   LAPAROSCOPY N/A 08/22/2020   Procedure: LAPAROSCOPY OPERATIVE, LYSIS OF ADHESIONS;  Surgeon: [name], MD;  Location: [facility];  Service: Gynecology;  Laterality: N/A;   MYRINGOTOMY WITH TUBE PLACEMENT Left 08/2022     FAMILY HISTORY:  Family History  Problem Relation Age of Onset   Diabetes Mother    Breast cancer Mother    Heart disease Father        first MI age 22   Hypertension Father    Cancer Father        cancer of the bone marrow   Breast cancer Maternal Grandmother        Deceased at 2     SOCIAL HISTORY:  reports that she has never smoked. She has never used smokeless tobacco. She reports that she does not drink alcohol and does not use drugs.  The patient is married and lives in  [place].  She***   ALLERGIES: Codeine, Morphine  sulfate, and Other   MEDICATIONS:  Current Outpatient Medications  Medication Sig Dispense Refill   busPIRone HCl (BUSPAR PO) Take by mouth.     Calcium Carbonate-Vit D-Min (CALCIUM 1200 PO) Take 1 tablet by mouth at bedtime.      Cranberry 125 MG TABS Take by mouth.     esomeprazole  (NEXIUM ) 20 MG capsule at bedtime.     famotidine  (PEPCID ) 20 MG tablet Take 20 mg by mouth at bedtime.     levothyroxine  (SYNTHROID ) 88 MCG tablet Take 1 tablet (88 mcg total) by mouth daily before breakfast.     Naproxen Sod-diphenhydrAMINE  (ALEVE PM PO) Take 80 mg by mouth daily.     Psyllium Fiber 0.52 g CAPS Take 0.52 g by mouth daily.     RESTASIS 0.05 % ophthalmic emulsion Place into both eyes.     traMADol  (ULTRAM ) 50 MG tablet Take 1 tablet (50 mg total) by mouth every 6 (six) hours as needed. 20 tablet 0   Zinc 50 MG TABS Take by mouth.     No current facility-administered medications for this visit.     REVIEW OF SYSTEMS: On review of systems, the patient reports that ***      PHYSICAL EXAM:  Wt Readings from Last 3 Encounters:  12/23/23 124 lb 1.9 oz (56.3 kg)  01/18/23 127 lb (57.6 kg)  06/04/22 127 lb (57.6 kg)   Temp Readings from Last 3 Encounters:  12/23/23 98 F (36.7 C)  08/22/20 (!) 97.3 F (36.3 C)  08/19/20 98.5 F (36.9 C) (Oral)   BP Readings from Last 3 Encounters:  12/23/23 132/72  12/06/23 116/75  06/04/22 119/75   Pulse Readings from Last 3 Encounters:  12/23/23 68  12/06/23 61  06/04/22 63    /10  In general this is a well appearing caucasian female in no acute distress. She's alert and oriented x4 and appropriate throughout the examination. Cardiopulmonary assessment is negative for acute distress and she exhibits normal effort. Her right breast reveals a well healing surgical site without erythema, separation, or drainage.     ECOG = ***  0 - Asymptomatic (Fully active, able to carry on all  predisease activities without restriction)  1 - Symptomatic but completely ambulatory (Restricted in physically strenuous activity but ambulatory and able to carry out work of a light or sedentary nature. For example, light housework, office work)  2 - Symptomatic, <50% in bed during the day (Ambulatory and capable of all self care but unable to carry out any work activities. Up and about more than 50% of waking hours)  3 - Symptomatic, >50% in bed, but not bedbound (Capable of only limited self-care, confined to  bed or chair 50% or more of waking hours)  4 - Bedbound (Completely disabled. Cannot carry on any self-care. Totally confined to bed or chair)  5 - Death   Raylene MM, Creech RH, Tormey DC, et al. [phone]). Toxicity and response criteria of the [facility]. Am. DOROTHA Bridges. Oncol. 5 (6): 649-55    LABORATORY DATA:  Lab Results  Component Value Date   WBC 6.0 08/19/2020   HGB 14.3 08/19/2020   HCT 43.6 08/19/2020   MCV 89.3 08/19/2020   PLT 227 08/19/2020   Lab Results  Component Value Date   NA 139 08/19/2020   K 3.9 08/19/2020   CL 106 08/19/2020   CO2 25 08/19/2020   Lab Results  Component Value Date   ALT 7 12/03/2019   AST 12 (L) 12/03/2019   ALKPHOS 41 12/03/2019   BILITOT 0.3 12/03/2019      RADIOGRAPHY: NM Sentinel Node Inj-No Rpt (Breast) Result Date: 12/23/2023 Lymphoseek was injected by the Nuclear Medicine Technologist for sentinel lymph node localization.   MM Breast Surgical Specimen Result Date: 12/23/2023 CLINICAL DATA:  Evaluate surgical specimen following lumpectomy for RIGHT breast cancer. EXAM: SPECIMEN RADIOGRAPH OF THE RIGHT BREAST COMPARISON:  Previous exam(s). FINDINGS: Status post excision of the RIGHT breast. The radioactive seed and RIBBON biopsy clip are present and intact. IMPRESSION: Specimen radiograph of the RIGHT breast. Electronically Signed   By: Reyes Phi M.D.   On: 12/23/2023 12:02   MM CLIP PLACEMENT  RIGHT Result Date: 12/21/2023 CLINICAL DATA:  Assess radioactive seed placement following ultrasound of position a right breast carcinoma. EXAM: DIAGNOSTIC RIGHT MAMMOGRAM POST ULTRASOUND-GUIDED RADIOACTIVE SEED PLACEMENT COMPARISON:  Previous exam(s). ACR Breast Density Category c: The breasts are heterogeneously dense, which may obscure small masses. FINDINGS: Mammographic images were obtained following ultrasound-guided radioactive seed placement. These demonstrate 3 air to seed to lie directly adjacent to the ribbon shaped post biopsy marker clip within the lateral retroareolar right breast mass. IMPRESSION: Appropriate location of the radioactive seed. Final Assessment: Post Procedure Mammograms for Seed Placement BI-RADS CATEGORY  67M: Post-Procedure Mammogram for Marker Placement Electronically Signed   By: Alm Parkins M.D.   On: 12/21/2023 10:20   US  RT RADIOACTIVE SEED LOC Result Date: 12/21/2023 CLINICAL DATA:  Patient presents for radioactive seed localization of a right breast carcinoma prior to surgical excision. EXAM: ULTRASOUND GUIDED RADIOACTIVE SEED LOCALIZATION OF THE RIGHT BREAST COMPARISON:  Previous exam(s). FINDINGS: Patient presents for radioactive seed localization prior to surgical excision. I met with the patient and we discussed the procedure of seed localization including benefits and alternatives. We discussed the high likelihood of a successful procedure. We discussed the risks of the procedure including infection, bleeding, tissue injury and further surgery. We discussed the low dose of radioactivity involved in the procedure. Informed, written consent was given. The usual time-out protocol was performed immediately prior to the procedure. Using ultrasound guidance, sterile technique, 1% lidocaine  and an I-125 radioactive seed, the mass and associated ribbon shaped post biopsy marker clip at 9 o'clock, 1 cm from the nipple, were localized using an inferior approach. The follow-up  mammogram images confirm the seed in the expected location and were marked for Dr. Curvin. Follow-up survey of the patient confirms presence of the radioactive seed. Order number of I-125 seed:  [id number]. Total activity:  0.262 millicuries.  Reference Date: 11/11/2023. The patient tolerated the procedure well and was [facility] from the [facility]. She was given instructions regarding seed removal.  IMPRESSION: Radioactive seed localization of the right breast. No apparent complications. Electronically Signed   By: Alm Parkins M.D.   On: 12/21/2023 10:09   US  RT BREAST BX W LOC DEV 1ST LESION IMG BX SPEC US  GUIDE Addendum Date: 12/16/2023 ADDENDUM REPORT: 12/16/2023 15:24 ADDENDUM: Pathology revealed GRADE II INVASIVE MAMMARY CARCINOMA, OTHER FINDINGS: NONE of the RIGHT breast, 9 o'clock, 1cmfn, (ribbon clip). This was found to be concordant by Dr. Inocente Ast. Pathology results were discussed with the patient and her husband, [name], by telephone. The patient reported doing well after the biopsy with tenderness at the site. Post biopsy instructions and care were reviewed and questions were answered. The patient was encouraged to call [facility] for any additional concerns. My direct phone number was provided. Surgical consultation request for Dr. Donnice Bury, per patient request, was sent to [facility] via secure EPIC message on December 10, 2023. Pathology results reported by Hendricks Benders, RN on 12/10/2023. Electronically Signed   By: Inocente Ast M.D.   On: 12/16/2023 15:24   Result Date: 12/16/2023 CLINICAL DATA:  58 year old female presenting for biopsy of a mass in the right breast. EXAM: ULTRASOUND GUIDED RIGHT BREAST CORE NEEDLE BIOPSY COMPARISON:  Previous exam(s). PROCEDURE: I met with the patient and we discussed the procedure of ultrasound-guided biopsy, including benefits and alternatives. We discussed the high likelihood of a successful  procedure. We discussed the risks of the procedure, including infection, bleeding, tissue injury, clip migration, and inadequate sampling. Informed written consent was given. The usual time-out protocol was performed immediately prior to the procedure. Lesion quadrant: Lower outer quadrant Using sterile technique and 1% Lidocaine  as local anesthetic, under direct ultrasound visualization, a 12 gauge spring-loaded device was used to perform biopsy of a mass in the right breast at 9 o'clock using a lateral approach. At the conclusion of the procedure a ribbon shaped tissue marker clip was deployed into the biopsy cavity. Follow up 2 view mammogram was performed and dictated separately. IMPRESSION: Ultrasound guided biopsy of a mass in the right breast at 9 o'clock. No apparent complications. Electronically Signed: By: Inocente Ast M.D. On: 12/09/2023 08:38   MM CLIP PLACEMENT RIGHT Result Date: 12/09/2023 CLINICAL DATA:  Post procedure mammogram for clip placement EXAM: 3D DIAGNOSTIC RIGHT MAMMOGRAM POST ULTRASOUND BIOPSY COMPARISON:  Previous exam(s). ACR Breast Density Category c: The breasts are heterogeneously dense, which may obscure small masses. FINDINGS: 3D Mammographic images were obtained following ultrasound guided biopsy of a mass in the right breast at 9 o'clock. The ribbon biopsy marking clip is in expected position at the site of biopsy. IMPRESSION: Appropriate positioning of the ribbon shaped biopsy marking clip at the site of biopsy in the right breast at 9 o'clock. Final Assessment: Post Procedure Mammograms for Marker Placement Electronically Signed   By: Inocente Ast M.D.   On: 12/09/2023 08:37   MM 3D DIAGNOSTIC MAMMOGRAM UNILATERAL RIGHT BREAST Result Date: 12/01/2023 CLINICAL DATA:  58 year old female presents for further evaluation of palpable RIGHT breast mass identified on self-examination. EXAM: DIGITAL DIAGNOSTIC UNILATERAL RIGHT MAMMOGRAM WITH TOMOSYNTHESIS AND CAD;  ULTRASOUND RIGHT BREAST LIMITED TECHNIQUE: Right digital diagnostic mammography and breast tomosynthesis was performed. The images were evaluated with computer-aided detection. ; Targeted ultrasound examination of the right breast was performed COMPARISON:  Previous exam(s). ACR Breast Density Category b: There are scattered areas of fibroglandular density. FINDINGS: Full field and spot compression views of the RIGHT breast demonstrate persistent asymmetry within the  anterior OUTER RIGHT breast. On physical exam, focal thickening at the 9 o'clock position of the RIGHT breast 1-2 cm from the nipple Targeted ultrasound is performed, showing a 1.4 x 1.2 x 1.6 cm ill-defined hypoechoic area/mass at the 9 o'clock position of the RIGHT breast 1 cm from the nipple at the site of patient concern. No abnormal RIGHT axillary lymph nodes are noted. IMPRESSION: 1. Indeterminate 1.6 cm hypoechoic area/mass at the 9 o'clock position of the RIGHT breast corresponding to the patient's palpable abnormality. Tissue sampling is recommended. No abnormal appearing RIGHT axillary lymph nodes. RECOMMENDATION: Ultrasound-guided RIGHT breast biopsy, which will be scheduled. I have discussed the findings and recommendations with the patient. If applicable, a reminder letter will be sent to the patient regarding the next appointment. BI-RADS CATEGORY  4: Suspicious. Electronically Signed   By: Reyes Phi M.D.   On: 12/01/2023 11:10   US  LIMITED ULTRASOUND INCLUDING AXILLA RIGHT BREAST Result Date: 12/01/2023 CLINICAL DATA:  58 year old female presents for further evaluation of palpable RIGHT breast mass identified on self-examination. EXAM: DIGITAL DIAGNOSTIC UNILATERAL RIGHT MAMMOGRAM WITH TOMOSYNTHESIS AND CAD; ULTRASOUND RIGHT BREAST LIMITED TECHNIQUE: Right digital diagnostic mammography and breast tomosynthesis was performed. The images were evaluated with computer-aided detection. ; Targeted ultrasound examination of the right  breast was performed COMPARISON:  Previous exam(s). ACR Breast Density Category b: There are scattered areas of fibroglandular density. FINDINGS: Full field and spot compression views of the RIGHT breast demonstrate persistent asymmetry within the anterior OUTER RIGHT breast. On physical exam, focal thickening at the 9 o'clock position of the RIGHT breast 1-2 cm from the nipple Targeted ultrasound is performed, showing a 1.4 x 1.2 x 1.6 cm ill-defined hypoechoic area/mass at the 9 o'clock position of the RIGHT breast 1 cm from the nipple at the site of patient concern. No abnormal RIGHT axillary lymph nodes are noted. IMPRESSION: 1. Indeterminate 1.6 cm hypoechoic area/mass at the 9 o'clock position of the RIGHT breast corresponding to the patient's palpable abnormality. Tissue sampling is recommended. No abnormal appearing RIGHT axillary lymph nodes. RECOMMENDATION: Ultrasound-guided RIGHT breast biopsy, which will be scheduled. I have discussed the findings and recommendations with the patient. If applicable, a reminder letter will be sent to the patient regarding the next appointment. BI-RADS CATEGORY  4: Suspicious. Electronically Signed   By: Reyes Phi M.D.   On: 12/01/2023 11:10       IMPRESSION/PLAN: 1. Stage ***IA, [id number]***, grade 2, ER/PR positive invasive *** carcinoma of the right breast. Dr. Dewey discusses the pathology findings and reviews the nature of *** breast disease.  The patient has been healing well since surgery.  She meets with Dr. Gudena later this week. We suspect he will recommend Oncotype Dx score to determine a role for systemic therapy. Provided that chemotherapy is not indicated, the patient's course would then be followed by external radiotherapy to the breast  to reduce risks of local recurrence. We also suspect that Dr. Gudena will offer adjuvant antiestrogen therapy to follow. We discussed the risks, benefits, short, and long term effects of radiotherapy, as well as the  curative intent, and the patient is interested in proceeding. Dr. Dewey discusses the delivery and logistics of radiotherapy and anticipates a course of 4 weeks of radiotherapy to the right breast. Written consent is obtained and placed in the chart, a copy was provided to the patient. Provided that Oncotype Dx is low risk, we will plan to coordinate simulation the first week in October 2025, and begin  the week of 01/24/24.   In a visit lasting *** minutes, greater than 50% of the time was spent face to face discussing the patient's condition, in preparation for the discussion, and coordinating the patient's care.   The above documentation reflects my direct findings during this shared patient visit. Please see the separate note by Dr. Dewey on this date for the remainder of the patient's plan of care.    Donald KYM Husband, [facility]   **Disclaimer: This note was dictated with voice recognition software. Similar sounding words can inadvertently be transcribed and this note may contain transcription errors which may not have been corrected upon publication of note.**

## 2023-12-27 NOTE — Progress Notes (Signed)
 New Breast Cancer Diagnosis: Right Breast   The patient palpated a mass in her right breast in March 2025.  She was seen for diagnostic workup in August 2025. Ultrasound noted a mass in the 9 o'clock position measuring 1.6 cm.    Histology per Pathology Report: grade 2, Invasive Mammary Carcinoma  Receptor Status: ER(positive), PR (positive), Her2-neu (negative), Ki-(20%)   Surgeon and surgical plan, if any:  Dr. Curvin -Right Breast Lumpectomy with radioactive seed and SLN biopsy 12/23/2023   Medical oncologist, treatment if any:   Dr. Gudena 12/30/2023   Family History of Breast/Ovarian/Prostate Cancer: Mom and maternal grandmother had breast cancer.   Lymphedema issues, if any: She reports a little swelling to the breast since surgery.     Pain issues, if any: Denies     SAFETY ISSUES: Prior radiation? No Pacemaker/ICD? No Possible current pregnancy? Postmenopausal Is the patient on methotrexate? No  Current Complaints / other details:

## 2023-12-28 ENCOUNTER — Ambulatory Visit
Admission: RE | Admit: 2023-12-28 | Discharge: 2023-12-28 | Disposition: A | Discharge status: Home / Self Care | Source: Ambulatory Visit | Attending: Radiation Oncology | Admitting: Radiation Oncology

## 2023-12-28 ENCOUNTER — Encounter: Payer: Self-pay | Admitting: Radiation Oncology

## 2023-12-28 VITALS — BP 129/89 | HR 58 | Temp 97.6°F | Resp 18 | Ht 63.0 in | Wt 126.6 lb

## 2023-12-28 DIAGNOSIS — Z791 Long term (current) use of non-steroidal anti-inflammatories (NSAID): Secondary | ICD-10-CM | POA: Insufficient documentation

## 2023-12-28 DIAGNOSIS — Z170 Estrogen receptor positive status [ER+]: Secondary | ICD-10-CM

## 2023-12-28 DIAGNOSIS — Z8616 Personal history of COVID-19: Secondary | ICD-10-CM | POA: Insufficient documentation

## 2023-12-28 DIAGNOSIS — Z1732 Human epidermal growth factor receptor 2 negative status: Secondary | ICD-10-CM | POA: Insufficient documentation

## 2023-12-28 DIAGNOSIS — K219 Gastro-esophageal reflux disease without esophagitis: Secondary | ICD-10-CM | POA: Diagnosis not present

## 2023-12-28 DIAGNOSIS — Z803 Family history of malignant neoplasm of breast: Secondary | ICD-10-CM | POA: Diagnosis not present

## 2023-12-28 DIAGNOSIS — Z79890 Hormone replacement therapy (postmenopausal): Secondary | ICD-10-CM | POA: Insufficient documentation

## 2023-12-28 DIAGNOSIS — N9489 Other specified conditions associated with female genital organs and menstrual cycle: Secondary | ICD-10-CM | POA: Diagnosis not present

## 2023-12-28 DIAGNOSIS — E063 Autoimmune thyroiditis: Secondary | ICD-10-CM | POA: Insufficient documentation

## 2023-12-28 DIAGNOSIS — C50411 Malignant neoplasm of upper-outer quadrant of right female breast: Secondary | ICD-10-CM | POA: Diagnosis present

## 2023-12-28 DIAGNOSIS — Z1721 Progesterone receptor positive status: Secondary | ICD-10-CM | POA: Insufficient documentation

## 2023-12-28 DIAGNOSIS — E039 Hypothyroidism, unspecified: Secondary | ICD-10-CM | POA: Insufficient documentation

## 2023-12-29 ENCOUNTER — Telehealth: Payer: Self-pay | Admitting: Radiation Oncology

## 2023-12-29 LAB — SURGICAL PATHOLOGY

## 2023-12-29 NOTE — Telephone Encounter (Signed)
 I called and spoke with the patient and let her know that Dr. Dewey reviewed her pathology results and would offer 4 weeks of radiation as we discussed yesterday, but given the microscopic findings in her 1 of 3 sampled sentinel nodes, Dr. Dewey would extend her fields of radiation to high tangents to cover the axilla. She is in agreement with this plan and will follow up with Dr. Odean tomorrow to discuss his recommendations as well. She is aware he may recommend additional testing on the tumor to determine a role for systemic therapy.

## 2023-12-30 ENCOUNTER — Inpatient Hospital Stay

## 2023-12-30 ENCOUNTER — Inpatient Hospital Stay: Attending: Hematology and Oncology | Admitting: Hematology and Oncology

## 2023-12-30 ENCOUNTER — Ambulatory Visit: Payer: Self-pay | Admitting: General Surgery

## 2023-12-30 VITALS — BP 120/69 | HR 77 | Temp 97.6°F | Resp 16 | Wt 124.6 lb

## 2023-12-30 DIAGNOSIS — Z170 Estrogen receptor positive status [ER+]: Secondary | ICD-10-CM | POA: Insufficient documentation

## 2023-12-30 DIAGNOSIS — C50411 Malignant neoplasm of upper-outer quadrant of right female breast: Secondary | ICD-10-CM | POA: Diagnosis not present

## 2023-12-30 NOTE — Assessment & Plan Note (Signed)
 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 I discussed the margins as well as lymph node surgeries. We also discussed the final staging along with previously performed ER/PR and HER-2/neu testing.  Treatment plan: Oncotype DX testing to determine if she would benefit from chemotherapy Adjuvant radiation therapy Adjuvant antiestrogen therapy  Oncotype counseling: I discussed Oncotype DX test. I explained to the patient that this is a 21 gene panel to evaluate patient tumors DNA to calculate recurrence score. This would help determine whether patient has high risk or intermediate risk or low risk breast cancer. She understands that if her tumor was found to be high risk, she would benefit from systemic chemotherapy. If low risk, no need of chemotherapy. If she was found to be intermediate risk, we would need to evaluate the score as well as other risk factors and determine if an abbreviated chemotherapy may be of benefit.  Return to clinic based upon Oncotype test result

## 2023-12-30 NOTE — Progress Notes (Signed)
 [facility] CONSULT NOTE  Patient Care Team: [name], MD as PCP - General (Family Medicine) [name], MD as Consulting Physician (Endocrinology) [name], MD as Consulting Physician (Plastic Surgery) [name], MD as Consulting Physician (General Surgery)  CHIEF COMPLAINTS/PURPOSE OF CONSULTATION:  Newly diagnosed breast cancer  HISTORY OF PRESENTING ILLNESS:    Ms. 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  She underwent right lumpectomy on 12/23/2023 which revealed a 2.8 cm mass with 1 lymph node showing micrometastatic disease.  She has been referred to us  for discussion regarding adjuvant treatment options.  She is healing recovering very well from recent surgery.  She is here today companied by her husband and her daughter who is an oncology nurse.  I reviewed her records extensively and collaborated the history with the patient.  SUMMARY OF ONCOLOGIC HISTORY: Oncology History  Malignant neoplasm of upper-outer quadrant of right breast in female, estrogen receptor positive (HCC)  12/09/2023 Initial Diagnosis   Palpable mass in the right breast measured 1.6 cm by mammogram and ultrasound.  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, margins negative, 1/3 lymph node with micrometastatic disease, anterior margin excision: Focus of IDC grade 2 with DCIS final margins negative   12/29/2023 Cancer Staging   Staging form: Breast, AJCC 8th Edition - Pathologic stage from 12/29/2023: Stage IB (pT2, pN44mi(sn), cM0, G2, ER+, PR+, HER2-) - Signed by [name], PA-C on 12/29/2023 Stage  prefix: Initial diagnosis Method of lymph node assessment: Sentinel lymph node biopsy Histologic grading system: 3 grade system      MEDICAL HISTORY:  Past Medical History:  Diagnosis Date   Breast cancer (HCC) 11/2023   Right   Bursitis left hip   Chronic throat clearing    ENT eval 2008 by Dr. Ethyl showed normal vocal cords and  slight edema of the arytenoid mucosa possibly indicative of globus type sx's and possible reflux symptoms.   Complication of anesthesia    COVID 03/04/2020   congestion loss of taste and smell x 7 days, took monoclonal antibody infusion   GERD (gastroesophageal reflux disease)    omeprazole  daily helps   Hashimoto's thyroiditis    History of bronchitis    Hypothyroidism    managed by endocrinologist ([name])   Pelvic congestion syndrome    on left side   Perforation bowel (HCC)    colon perforation 11-30-2019 hospitialized, and march 2022   Pericardial effusion    PONV (postoperative nausea and vomiting)    nausea   Wears glasses    for reading    SURGICAL HISTORY: Past Surgical History:  Procedure Laterality Date   BREAST BIOPSY Right 12/09/2023   US  RT BREAST BX W LOC DEV 1ST LESION IMG BX SPEC US  GUIDE 12/09/2023 [facility]   BREAST BIOPSY  12/21/2023   US  RT RADIOACTIVE SEED LOC 12/21/2023 [facility]AMMOGR[facility]   BREAST LUMPECTOMY WITH RADIOACTIVE SEED AND SENTINEL LYMPH NODE BIOPSY Right 12/23/2023   Procedure: RIGHT BREAST LUMPECTOMY WITH RADIOACTIVE SEED AND SENTINEL LYMPH NODE BIOPSY;  Surgeon: [name], MD;  Location: [facility];  Service: General;  Laterality: Right;  RIGHT BREAST RADIOACTIVE SEED LOCALIZED LUMPECTOMY SENTINEL NODE BIOPSY   CESAREAN SECTION  1988   first pregnancy.  Her second delivery was vaginal.   colonscopy  01/2020   x 3   CYST EXCISION  1999   Epidermal inclusion cyst in right groin (Dr. Mikell)   GROIN DEBRIDEMENT  1999   cyst removed   LAPAROSCOPY N/A 08/22/2020   Procedure:  LAPAROSCOPY OPERATIVE, LYSIS OF ADHESIONS;  Surgeon: [name], MD;  Location: [facility];  Service: Gynecology;  Laterality: N/A;   MYRINGOTOMY WITH TUBE PLACEMENT Left 08/2022    SOCIAL HISTORY: Social History   Socioeconomic History   Marital status: Married    Spouse name: Not on file   Number of children: 3   Years of education: Not on file   Highest education level: Not on file  Occupational History   Not on file  Tobacco Use   Smoking status: Never   Smokeless tobacco: Never  Vaping Use   Vaping status: Never Used  Substance and Sexual Activity   Alcohol use: No   Drug use: No   Sexual activity: Not on file  Other Topics Concern   Not on file  Social History Narrative   Divorced, engaged.     Three grown children.   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  Bachelor's degree from [place]  state.     She is originally from [place].   No T/A/Ds.   Exercise: walks a lot (1-2 times per week).   She tries to eat healthy.   Social Drivers of Corporate investment banker Strain: Not on file  Food Insecurity: No Food Insecurity (12/30/2023)   Hunger Vital Sign    Worried About Running Out of Food in the Last Year: Never true    Ran Out of Food in the Last Year: Never true  Transportation Needs: No Transportation Needs (12/30/2023)   PRAPARE - Administrator, Civil Service (Medical): No    Lack of Transportation (Non-Medical): No  Physical Activity: Not on file  Stress: Not on file  Social Connections: Unknown (05/15/2022)   Received from [facility]   Social Network    Social Network: Not on file  Intimate Partner Violence: Not At Risk (12/30/2023)   Humiliation, Afraid, Rape, and Kick questionnaire    Fear of Current or Ex-Partner: No    Emotionally Abused: No    Physically Abused: No    Sexually Abused: No    FAMILY HISTORY: Family History  Problem Relation Age of Onset   Diabetes Mother    Breast cancer Mother    Heart  disease Father        first MI age 68   Hypertension Father    Cancer Father        cancer of the bone marrow   Breast cancer Maternal Grandmother        Deceased at 51    ALLERGIES:  is allergic to codeine, morphine  sulfate, and other.  MEDICATIONS:  Current Outpatient Medications  Medication Sig Dispense Refill   busPIRone HCl (BUSPAR PO) Take by mouth.     Calcium Carbonate-Vit D-Min (CALCIUM 1200 PO) Take 1 tablet by mouth at bedtime.      Cranberry 125 MG TABS Take by mouth.     docusate sodium (COLACE) 100 MG capsule Take 100 mg by mouth 2 (two) times daily.     esomeprazole  (  NEXIUM ) 20 MG capsule at bedtime.     famotidine  (PEPCID ) 20 MG tablet Take 20 mg by mouth at bedtime.     levothyroxine  (SYNTHROID ) 88 MCG tablet Take 1 tablet (88 mcg total) by mouth daily before breakfast.     Multiple Vitamin (MULTIVITAMIN) tablet Take 1 tablet by mouth daily.     Naproxen Sod-diphenhydrAMINE  (ALEVE PM PO) Take 80 mg by mouth daily.     Psyllium Fiber 0.52 g CAPS Take 0.52 g by mouth daily.     RESTASIS 0.05 % ophthalmic emulsion Place into both eyes.     Zinc 50 MG TABS Take by mouth.     No current facility-administered medications for this visit.    REVIEW OF SYSTEMS:   Constitutional: Denies fevers, chills or abnormal night sweats   All other systems were reviewed with the patient and are negative.  PHYSICAL EXAMINATION: ECOG PERFORMANCE STATUS: 0 - Asymptomatic  Vitals:   12/30/23 1538  BP: 120/69  Pulse: 77  Resp: 16  Temp: 97.6 F (36.4 C)  SpO2: 97%   Filed Weights   12/30/23 1538  Weight: 124 lb 9.6 oz (56.5 kg)    GENERAL:alert, no distress and comfortable    LABORATORY DATA:  I have reviewed the data as listed Lab Results  Component Value Date   WBC 6.0 08/19/2020   HGB 14.3 08/19/2020   HCT 43.6 08/19/2020   MCV 89.3 08/19/2020   PLT 227 08/19/2020   Lab Results  Component Value Date   NA 139 08/19/2020   K 3.9 08/19/2020   CL 106  08/19/2020   CO2 25 08/19/2020    RADIOGRAPHIC STUDIES: I have personally reviewed the radiological reports and agreed with the findings in the report.  ASSESSMENT AND PLAN:  Malignant neoplasm of upper-outer quadrant of right breast in female, estrogen receptor positive (HCC) 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, anterior margin excision: Focus of IDC grade 2 with DCIS final margins negative  Pathology counseling: I discussed the final pathology report of the patient provided  a copy of this report. I discussed the margins as well as lymph node surgeries. We also discussed the final staging along with previously performed ER/PR and HER-2/neu testing.  Treatment plan: Oncotype DX testing to determine if she would benefit from chemotherapy Adjuvant radiation therapy Adjuvant antiestrogen therapy  Breast cancer diagnosis with back pain issues: I would like to order CT chest abdomen pelvis and bone scan for staging purposes. Surveillance discussion: Based on the inability of regular mammograms in detecting a breast cancer, we will consider doing contrast-enhanced mammograms for surveillance.  I also discussed circulating tumor DNA testing for minimal residual disease monitoring after radiation is complete.   Oncotype counseling: I discussed Oncotype DX test. I explained to the patient that this is a 21 gene panel to evaluate patient tumors DNA to calculate recurrence score. This would help determine whether patient has high risk or intermediate risk or low risk breast cancer. She understands that if her tumor was found to be high risk, she would benefit from systemic chemotherapy. If low risk, no need of chemotherapy. If she was found to be intermediate risk, we would need to evaluate the score as well as other risk factors and determine if an abbreviated chemotherapy may be of benefit.  Return  to clinic based upon Oncotype test result      All questions were answered. The  patient knows to call the clinic with any problems, questions or concerns.    Viinay K Steve Gregg, MD 12/30/23

## 2023-12-31 ENCOUNTER — Encounter: Payer: Self-pay | Admitting: *Deleted

## 2023-12-31 ENCOUNTER — Telehealth: Payer: Self-pay | Admitting: *Deleted

## 2023-12-31 NOTE — Telephone Encounter (Signed)
 Ordered oncotype per Dr. Pamelia Hoit. Sent requisition to pathology and exact sciences.

## 2023-12-31 NOTE — Telephone Encounter (Signed)
 Called and spoke to patient to follow up on her med onc visit yesterday. Reviewed role of navigation and her next steps (scans/awaiting oncotype results). She was appreciative of  phone call and had no questions at this time. Aware to call if any needs arise.

## 2024-01-04 ENCOUNTER — Other Ambulatory Visit: Payer: Self-pay | Admitting: *Deleted

## 2024-01-04 DIAGNOSIS — Z170 Estrogen receptor positive status [ER+]: Secondary | ICD-10-CM

## 2024-01-05 ENCOUNTER — Telehealth: Payer: Self-pay | Admitting: Hematology and Oncology

## 2024-01-05 ENCOUNTER — Ambulatory Visit
Admission: RE | Admit: 2024-01-05 | Discharge: 2024-01-05 | Disposition: A | Discharge status: Home / Self Care | Source: Ambulatory Visit | Attending: Hematology and Oncology | Admitting: Hematology and Oncology

## 2024-01-05 ENCOUNTER — Inpatient Hospital Stay
Admission: RE | Admit: 2024-01-05 | Discharge: 2024-01-05 | Discharge status: Home / Self Care | Source: Ambulatory Visit | Attending: Hematology and Oncology

## 2024-01-05 DIAGNOSIS — C50411 Malignant neoplasm of upper-outer quadrant of right female breast: Secondary | ICD-10-CM

## 2024-01-05 MED ORDER — IOPAMIDOL (ISOVUE-300) INJECTION 61%
100.0000 mL | Freq: Once | INTRAVENOUS | Status: AC | PRN
  Administered 2024-01-05: 100 mL via INTRAVENOUS

## 2024-01-05 NOTE — Telephone Encounter (Signed)
 I reviewed the CT chest abdomen pelvis report with the patient. -Postoperative seroma -Benign/reactive lymph nodes -No evidence of metastatic disease elsewhere  I recommended that we can reexamine her axilla 3 to 6 months postsurgery.

## 2024-01-06 ENCOUNTER — Ambulatory Visit (HOSPITAL_COMMUNITY)
Admission: RE | Admit: 2024-01-06 | Discharge: 2024-01-06 | Disposition: A | Discharge status: Home / Self Care | Source: Ambulatory Visit | Attending: Hematology and Oncology | Admitting: Hematology and Oncology

## 2024-01-06 ENCOUNTER — Ambulatory Visit (HOSPITAL_COMMUNITY)

## 2024-01-06 DIAGNOSIS — Z170 Estrogen receptor positive status [ER+]: Secondary | ICD-10-CM | POA: Diagnosis present

## 2024-01-06 DIAGNOSIS — C50411 Malignant neoplasm of upper-outer quadrant of right female breast: Secondary | ICD-10-CM | POA: Insufficient documentation

## 2024-01-06 MED ORDER — TECHNETIUM TC 99M MEDRONATE IV KIT
20.0000 | PACK | Freq: Once | INTRAVENOUS | Status: AC
  Administered 2024-01-06: 22 via INTRAVENOUS

## 2024-01-07 ENCOUNTER — Encounter: Payer: Self-pay | Admitting: *Deleted

## 2024-01-07 ENCOUNTER — Inpatient Hospital Stay: Admitting: Licensed Clinical Social Worker

## 2024-01-07 ENCOUNTER — Telehealth: Payer: Self-pay | Admitting: *Deleted

## 2024-01-07 NOTE — Progress Notes (Signed)
 [facility] Clinical Social Work  Initial Assessment   Melinda Forbes is a 58 y.o. year old female contacted by phone. Clinical Social Work was referred by new patient protocol for assessment of psychosocial needs.   SDOH (Social Determinants of Health) assessments performed: Yes   SDOH Screenings   Food Insecurity: No Food Insecurity (12/30/2023)  Housing: Low Risk  (12/30/2023)  Transportation Needs: No Transportation Needs (12/30/2023)  Utilities: Not At Risk (12/30/2023)  Depression (PHQ2-9): Low Risk  (12/30/2023)  Social Connections: Unknown (05/15/2022)   Received from [facility]  Tobacco Use: Low Risk  (01/07/2024)   Received from [facility]    PHQ 2/9:    12/30/2023    3:44 PM 12/28/2023    9:13 AM  Depression screen PHQ 2/9  Decreased Interest 0 0  Down, Depressed, Hopeless 0 0  PHQ - 2 Score 0 0     Distress Screen completed: No     No data to display            Family/Social Information:  Housing Arrangement: patient lives with spouse. Has adult children who live locally Family members/support persons in your life? Family. 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  Income source: Employment Financial concerns: No Type of concern: None Food access concerns: no Religious or spiritual practice: Not known Advanced directives: Not known Services Currently in place:  [employer]  Coping/ Adjustment to diagnosis: Patient understands treatment plan and what happens next? yes Concerns about diagnosis and/or treatment: a little overwhelmed Patient reported stressors: legal issue related to previous job Current coping skills/ strengths: Ability for insight , Communication skills , and Supportive family/friends     SUMMARY: Current SDOH Barriers:  No major barriers identified today  Clinical Social Work Clinical Goal(s):  No clinical social work goals at this  time  Interventions: Discussed common feeling and emotions when being diagnosed with cancer, and the importance of support during treatment Informed patient of the support team roles and support services at [facility] Provided CSW contact information and encouraged patient to call with any questions or concerns   Follow Up Plan: Patient will contact CSW with any support or resource needs Patient verbalizes understanding of plan: Yes    Heatherly Stenner E Ilean Spradlin, LCSW Clinical Social Worker [facility]

## 2024-01-07 NOTE — Telephone Encounter (Signed)
 Received oncotype results of 31/18%. Confirmed follow up appt with Dr. Gudena for 10/2 at 12 to discuss results. Patient aware

## 2024-01-11 ENCOUNTER — Ambulatory Visit: Admitting: Physical Therapy

## 2024-01-11 ENCOUNTER — Encounter (HOSPITAL_COMMUNITY): Payer: Self-pay

## 2024-01-11 NOTE — Therapy (Signed)
 OUTPATIENT PHYSICAL THERAPY BREAST CANCER POST OP FOLLOW UP   Patient Name: Melinda Forbes MRN: [id number] DOB:[birth year], 58 y.o., female Today's Date: 01/11/2024  END OF SESSION:   Past Medical History:  Diagnosis Date   Breast cancer (HCC) 11/2023   Right   Bursitis left hip   Chronic throat clearing    ENT eval 2008 by Dr. Ethyl showed normal vocal cords and  slight edema of the arytenoid mucosa possibly indicative of globus type sx's and possible reflux symptoms.   Complication of anesthesia    COVID 03/04/2020   congestion loss of taste and smell x 7 days, took monoclonal antibody infusion   GERD (gastroesophageal reflux disease)    omeprazole  daily helps   Hashimoto's thyroiditis    History of bronchitis    Hypothyroidism    managed by endocrinologist ([name])   Pelvic congestion syndrome    on left side   Perforation bowel (HCC)    colon perforation 11-30-2019 hospitialized, and march 2022   Pericardial effusion    PONV (postoperative nausea and vomiting)    nausea   Wears glasses    for reading   Past Surgical History:  Procedure Laterality Date   BREAST BIOPSY Right 12/09/2023   US  RT BREAST BX W LOC DEV 1ST LESION IMG BX SPEC US  GUIDE 12/09/2023 [facility]AMMOGRAPHY   BREAST BIOPSY  12/21/2023   US  RT RADIOACTIVE SEED LOC 12/21/2023 [facility] MAMMOGRAPHY   BREAST LUMPECTOMY WITH RADIOACTIVE SEED AND SENTINEL LYMPH NODE BIOPSY Right 12/23/2023   Procedure: RIGHT BREAST LUMPECTOMY WITH RADIOACTIVE SEED AND SENTINEL LYMPH NODE BIOPSY;  Surgeon: [name], MD;  Location: [facility];  Service: General;  Laterality: Right;  RIGHT BREAST RADIOACTIVE SEED LOCALIZED LUMPECTOMY SENTINEL NODE BIOPSY   CESAREAN SECTION  1988   first pregnancy.  Her second delivery was vaginal.   colonscopy  01/2020   x 3   CYST EXCISION  1999   Epidermal inclusion cyst in right groin (Dr. Mikell)   GROIN DEBRIDEMENT  1999   cyst removed   LAPAROSCOPY N/A  08/22/2020   Procedure: LAPAROSCOPY OPERATIVE, LYSIS OF ADHESIONS;  Surgeon: [name], MD;  Location: [facility];  Service: Gynecology;  Laterality: N/A;   MYRINGOTOMY WITH TUBE PLACEMENT Left 08/2022   Patient Active Problem List   Diagnosis Date Noted   Malignant neoplasm of upper-outer quadrant of right breast in female, estrogen receptor positive (HCC) 12/20/2023   Impacted cerumen of left ear 12/07/2023   Chronic rhinitis 01/19/2023   Other specified disorders of eustachian tube, left ear 01/19/2023   Precordial pain 06/04/2022   Pericardial effusion 08/08/2020   Atypical chest pain 08/08/2020   History of bronchitis    Elevated blood pressure reading    Chronic throat clearing    Hypokalemia 12/01/2019   Abscess 12/01/2019   Proctocolitis with abscess 11/30/2019   Pain in left hip 10/27/2018   History of recurrent UTIs 01/06/2012   Hypothyroidism    GERD (gastroesophageal reflux disease)     PCP: Alberta Sharps, MD   REFERRING PROVIDER:     [name], MD   REFERRING DIAG: C50.411,Z17.0 (ICD-10-CM) - Malignant neoplasm of upper-outer quadrant of right breast in female, estrogen receptor positive (HCC)   THERAPY DIAG:  No diagnosis found.  Rationale for Evaluation and Treatment: Rehabilitation  ONSET DATE: 12/10/23  SUBJECTIVE:  SUBJECTIVE STATEMENT: ***  PERTINENT HISTORY:  Patient was diagnosed on 12/10/23 with right grade 2. It measures 1.6 cm and is located in the upper-outer quadrant. It is ER/PR positive, HER2 - with a Ki67 of 20%.   PATIENT GOALS:  Reassess how my recovery is going related to arm function, pain, and swelling.  PAIN:  Are you having pain? {OPRCPAIN:27236}  PRECAUTIONS: Recent Surgery, right UE Lymphedema risk,   RED  FLAGS: None   ACTIVITY LEVEL / LEISURE: ***   OBJECTIVE:   PATIENT SURVEYS:  QUICK DASH: ***  OBSERVATIONS: ***  POSTURE:  Forward head and rounded shoulders posture   UPPER EXTREMITY AROM/PROM:   A/PROM RIGHT   eval    Shoulder extension 76  Shoulder flexion 175  Shoulder abduction 170  Shoulder internal rotation 51  Shoulder external rotation 86                          (Blank rows = not tested)   A/PROM LEFT   eval  Shoulder extension 71  Shoulder flexion 165  Shoulder abduction 170  Shoulder internal rotation 56  Shoulder external rotation 90                          (Blank rows = not tested)   CERVICAL AROM: All within normal limits:      Percent limited  Flexion WFL  Extension WFL  Right lateral flexion WFL  Left lateral flexion WFL  Right rotation WFL  Left rotation WFL      UPPER EXTREMITY STRENGTH: 5/5   LYMPHEDEMA ASSESSMENTS (in cm):    LANDMARK RIGHT   eval  10 cm proximal to olecranon process 25  Olecranon process 23.3  10 cm proximal to ulnar styloid process 20.5  Just proximal to ulnar styloid process 15  Across hand at thumb web space 18.7  At base of 2nd digit 6  (Blank rows = not tested)   LANDMARK LEFT   eval  10 cm proximal to olecranon process 25.7  Olecranon process 23.5  10 cm proximal to ulnar styloid process 20  Just proximal to ulnar styloid process 14.5  Across hand at thumb web space 18  At base of 2nd digit 5.6  (Blank rows = not tested)  Surgery type/Date: *** Number of lymph nodes removed: *** Current/past treatment (chemo, radiation, hormone therapy): *** Other symptoms:  Heaviness/tightness {yes/no:20286} Pain {yes/no:20286} Pitting edema {yes/no:20286} Infections {yes/no:20286} Decreased scar mobility {yes/no:20286} Stemmer sign {yes/no:20286}  PATIENT EDUCATION:  Education details: *** Person educated: {Person educated:25204} Education method: {Education Method:25205} Education comprehension:  {Education Comprehension:25206}  HOME EXERCISE PROGRAM: Reviewed previously given post op HEP. ***  ASSESSMENT:  CLINICAL IMPRESSION: ***  Pt will benefit from skilled therapeutic intervention to improve on the following deficits: Decreased knowledge of precautions, impaired UE functional use, pain, decreased ROM, postural dysfunction.   PT treatment/interventions: ADL/Self care home management, {rehab planned interventions:25118::97110-Therapeutic exercises,97530- Therapeutic [phone]- Neuromuscular re-education,97535- Self Rjmz,02859- Manual therapy,Patient/Family education}   GOALS: Goals reviewed with patient? {yes/no:20286}  GOALS MET AT EVAL:  GOALS Name Target Date Goal status  1 Pt will be able to verbalize understanding of pertinent lymphedema risk reduction practices relevant to her dx specifically related to skin care.  Baseline:  No knowledge Eval Achieved at eval  2 Pt will be able to return demo and/or verbalize understanding of the post op HEP related to regaining shoulder ROM. Baseline:  No knowledge Eval Achieved at eval  3 Pt will be able to verbalize understanding of the importance of viewing the post op After Breast CA Class video for further lymphedema risk reduction education and therapeutic exercise.  Baseline:  No knowledge Eval Achieved at eval   LONG TERM GOALS:  (STG=LTG)  GOALS Name Target Date  Goal status  1 Pt will demonstrate she has regained full shoulder ROM and function post operatively compared to baselines.  Baseline: *** INITIAL  2  *** INITIAL  3  *** INITIAL  4  *** INITIAL     PLAN:  PT FREQUENCY/DURATION: ***  PLAN FOR NEXT SESSION: ***   [facility]  [address]  [place] [address]  [phone]  After Breast Cancer Class Video It is recommended you view the ABC class video to be educated on lymphedema risk reduction. This video lasts for about 30 minutes. It can be  viewed on our website here: [email or web address]  Scar massage You can begin gentle scar massage to you incision sites. Gently place one hand on the incision and move the skin (without sliding on the skin) in various directions. Do this for a few minutes and then you can gently massage either coconut oil or vitamin E cream into the scars.  Compression garment You should continue wearing your compression bra until you feel like you no longer have swelling.  Home exercise Program Continue doing the exercises you were given until you feel like you can do them without feeling any tightness at the end.   Walking Program Studies show that 30 minutes of walking per day (fast enough to elevate your heart rate) can significantly reduce the risk of a cancer recurrence. If you can't walk due to other medical reasons, we encourage you to find another activity you could do (like a stationary bike or water exercise).  Posture After breast cancer surgery, people frequently sit with rounded shoulders posture because it puts their incisions on slack and feels better. If you sit like this and scar tissue forms in that position, you can become very tight and have pain sitting or standing with good posture. Try to be aware of your posture and sit and stand up tall to heal properly.  Follow up PT: It is recommended you return every 3 months for the first 3 years following surgery to be assessed on the SOZO machine for an L-Dex score. This helps prevent clinically significant lymphedema in 95% of patients. These follow up screens are 10 minute appointments that you are not billed for.  [facility] [place], PT 01/11/2024, 11:27 AM

## 2024-01-12 ENCOUNTER — Encounter: Payer: Self-pay | Admitting: Physical Therapy

## 2024-01-12 ENCOUNTER — Ambulatory Visit: Attending: General Surgery | Admitting: Physical Therapy

## 2024-01-12 DIAGNOSIS — C50411 Malignant neoplasm of upper-outer quadrant of right female breast: Secondary | ICD-10-CM | POA: Insufficient documentation

## 2024-01-12 DIAGNOSIS — Z170 Estrogen receptor positive status [ER+]: Secondary | ICD-10-CM | POA: Diagnosis present

## 2024-01-12 DIAGNOSIS — R293 Abnormal posture: Secondary | ICD-10-CM | POA: Diagnosis present

## 2024-01-13 ENCOUNTER — Inpatient Hospital Stay (HOSPITAL_BASED_OUTPATIENT_CLINIC_OR_DEPARTMENT_OTHER): Admitting: Hematology and Oncology

## 2024-01-13 ENCOUNTER — Inpatient Hospital Stay: Attending: Hematology and Oncology | Admitting: Licensed Clinical Social Worker

## 2024-01-13 ENCOUNTER — Inpatient Hospital Stay

## 2024-01-13 ENCOUNTER — Encounter: Payer: Self-pay | Admitting: *Deleted

## 2024-01-13 ENCOUNTER — Encounter: Payer: Self-pay | Admitting: Licensed Clinical Social Worker

## 2024-01-13 ENCOUNTER — Other Ambulatory Visit: Payer: Self-pay | Admitting: Licensed Clinical Social Worker

## 2024-01-13 VITALS — BP 134/74 | HR 57 | Temp 98.3°F | Resp 18 | Ht 63.0 in | Wt 124.5 lb

## 2024-01-13 DIAGNOSIS — Z79899 Other long term (current) drug therapy: Secondary | ICD-10-CM

## 2024-01-13 DIAGNOSIS — Z5111 Encounter for antineoplastic chemotherapy: Secondary | ICD-10-CM | POA: Insufficient documentation

## 2024-01-13 DIAGNOSIS — C50411 Malignant neoplasm of upper-outer quadrant of right female breast: Secondary | ICD-10-CM | POA: Diagnosis not present

## 2024-01-13 DIAGNOSIS — Z170 Estrogen receptor positive status [ER+]: Secondary | ICD-10-CM | POA: Diagnosis not present

## 2024-01-13 DIAGNOSIS — Z803 Family history of malignant neoplasm of breast: Secondary | ICD-10-CM

## 2024-01-13 DIAGNOSIS — Z1379 Encounter for other screening for genetic and chromosomal anomalies: Secondary | ICD-10-CM

## 2024-01-13 DIAGNOSIS — Z5189 Encounter for other specified aftercare: Secondary | ICD-10-CM | POA: Insufficient documentation

## 2024-01-13 DIAGNOSIS — Z5181 Encounter for therapeutic drug level monitoring: Secondary | ICD-10-CM | POA: Diagnosis not present

## 2024-01-13 DIAGNOSIS — Z79630 Long term (current) use of alkylating agent: Secondary | ICD-10-CM | POA: Insufficient documentation

## 2024-01-13 DIAGNOSIS — Z79632 Long term (current) use of antitumor antibiotic: Secondary | ICD-10-CM | POA: Insufficient documentation

## 2024-01-13 LAB — GENETIC SCREENING ORDER

## 2024-01-13 MED ORDER — ONDANSETRON HCL 8 MG PO TABS: ORAL_TABLET | ORAL | 1 refills | Status: DC

## 2024-01-13 MED ORDER — PROCHLORPERAZINE MALEATE 10 MG PO TABS: 10.0000 mg | ORAL_TABLET | Freq: Four times a day (QID) | ORAL | 1 refills | Status: DC | PRN

## 2024-01-13 MED ORDER — DEXAMETHASONE 4 MG PO TABS: ORAL_TABLET | ORAL | 1 refills | Status: DC

## 2024-01-13 MED ORDER — LIDOCAINE-PRILOCAINE 2.5-2.5 % EX CREA: TOPICAL_CREAM | CUTANEOUS | 3 refills | Status: AC

## 2024-01-13 NOTE — Progress Notes (Signed)
 START ON PATHWAY REGIMEN - Breast     Cycles 1 through 4: A cycle is every 14 days:     Doxorubicin      Cyclophosphamide      Pegfilgrastim-[id number]    Cycles 5 through 16: A cycle is every 7 days:     Paclitaxel   **Always confirm dose/schedule in your pharmacy ordering system**  Patient Characteristics: Postoperative without Neoadjuvant Therapy, M0 (Pathologic Staging), Invasive Disease, Adjuvant Therapy, HER2 Negative, ER Positive, Node Positive, Node Positive (1-3), Oncotyping Ordered, Oncotype High Risk (? 26) Therapeutic Status: Postoperative without Neoadjuvant Therapy, M0 (Pathologic Staging) AJCC Grade: G2 AJCC N Category: pN11mi AJCC M Category: cM0 ER Status: Positive (+) AJCC 8 Stage Grouping: IB HER2 Status: Negative (-) Oncotype Dx Recurrence Score: 31 AJCC T Category: pT2 PR Status: Positive (+) Has this patient completed genomic testing<= Yes - Oncotype DX(R) Intent of Therapy: Curative Intent, Discussed with Patient

## 2024-01-13 NOTE — Assessment & Plan Note (Signed)
 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, anterior margin excision: Focus of IDC grade 2 with DCIS final margins negative  Oncotype DX: Recurrence score 31 (18% risk of recurrence)  01/05/2024: CT CAP and bone scan: Simple cysts in the liver largest 1.4 cm, several sub-5 mm ill-defined foci too small to characterize, few prominent right axillary lymph nodes, seroma right axilla 4.2 cm, ER 100%, PR 10%, HER2 1+ negative, Ki67 20%  Recommendation: Adjuvant chemotherapy with dose dense Adriamycin and Cytoxan x 4 followed by Taxol x 12 Adjuvant radiation therapy Antiestrogen therapy  Chemotherapy Counseling: I discussed the risks and benefits of chemotherapy including the risks of nausea/ vomiting, risk of infection from low WBC count, fatigue due to chemo or anemia, bruising or bleeding due to low platelets, mouth sores, loss/ change in taste and decreased appetite. Liver and kidney function will be monitored through out chemotherapy as abnormalities in liver and kidney function may be a side effect of treatment. Cardiac dysfunction due to Adriamycin and neuropathy risk from Taxol were discussed in detail. Risk of permanent bone marrow dysfunction and leukemia due to chemo were also discussed.  Plan: Echo, chemo class, port placement, start chemotherapy in 2 weeks

## 2024-01-13 NOTE — Progress Notes (Addendum)
 REFERRING PROVIDER: [name], MD [address] [place],  [place] [address]  PRIMARY PROVIDER:  [name], MD  PRIMARY REASON FOR VISIT:  1. Malignant neoplasm of upper-outer quadrant of right breast in female, estrogen receptor positive (HCC)   2. Family history of breast cancer    I connected with Melinda Forbes on 01/13/2024 at 10:00 AM EDT by telephone and verified that I am speaking with the correct person using three identifiers.    Patient location: home Provider location: [facility]  HISTORY OF PRESENT ILLNESS:   Melinda Forbes, a 58 y.o. female, was seen for a [place] cancer genetics consultation at the request of Dr. Curvin due to a personal and family history of cancer.  Melinda Forbes presents to clinic today to discuss the possibility of a hereditary predisposition to cancer, genetic testing, and to further clarify her future cancer risks, as well as potential cancer risks for family members.   CANCER HISTORY:  Oncology History  Malignant neoplasm of upper-outer quadrant of right breast in female, estrogen receptor positive (HCC)  12/09/2023 Initial Diagnosis   Palpable mass in the right breast measured 1.6 cm by mammogram and ultrasound.  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 The treatment plan includes lumpectomy, completed 12/23/2023. She meets with Dr. Gudena today to discuss adjuvant chemotherapy.   RELEVANT MEDICAL HISTORY:  Menarche was at age 58.  First live birth at age 89.  Ovaries intact: yes.  Hysterectomy: no.  Menopausal status: postmenopausal.  HRT use: 0 years. Colonoscopy: yes; normal.  Melinda Forbes had negative screening through the [employer] program. [employer] Population Screen is a screening test that analyzes 11 genes related to hereditary breast and ovarian cancer syndrome (HBOC), Lynch syndrome, and familial hypercholesterolemia. These include APOB, BRCA1, BRCA2, EPCAM, LDLR, LDLRAP1, PCSK9, PMS2 (excluding exons 11-15), MLH1, MSH2, MSH6. This test reports pathogenic and likely pathogenic variants, but does not report on variants of unknown significance. The absence of any additional pathogenic variants is reassuring, but does not rule out a hereditary condition. There are other variants and genes associated with heart disease, hereditary cancer and other inherited conditions that were not included in this test.    Past Medical History:  Diagnosis Date   Breast cancer (HCC) 11/2023   Right   Bursitis left hip   Chronic throat clearing    ENT eval 2008 by Dr. Ethyl showed normal vocal cords and  slight edema of the arytenoid mucosa possibly indicative of globus type sx's and possible reflux symptoms.   Complication of anesthesia    COVID 03/04/2020   congestion loss of taste and smell x 7 days, took monoclonal antibody infusion   GERD (gastroesophageal reflux disease)    omeprazole  daily helps   Hashimoto's thyroiditis  History of bronchitis    Hypothyroidism    managed by endocrinologist ([name])   Pelvic congestion syndrome    on left side   Perforation bowel (HCC)    colon perforation 11-30-2019 hospitialized, and march 2022   Pericardial effusion    PONV (postoperative nausea and vomiting)    nausea   Wears  glasses    for reading    Past Surgical History:  Procedure Laterality Date   BREAST BIOPSY Right 12/09/2023   US  RT BREAST BX W LOC DEV 1ST LESION IMG BX SPEC US  GUIDE 12/09/2023 [facility] MAMMOGR[facility]   BREAST BIOPSY  12/21/2023   US  RT RADIOACTIVE SEED LOC 12/21/2023 [facility] MAMMOGRAPHY   BREAST LUMPECTOMY WITH RADIOACTIVE SEED AND SENTINEL LYMPH NODE BIOPSY Right 12/23/2023   Procedure: RIGHT BREAST LUMPECTOMY WITH RADIOACTIVE SEED AND SENTINEL LYMPH NODE BIOPSY;  Surgeon: [name], MD;  Location: [facility];  Service: General;  Laterality: Right;  RIGHT BREAST RADIOACTIVE SEED LOCALIZED LUMPECTOMY SENTINEL NODE BIOPSY   CESAREAN SECTION  1988   first pregnancy.  Her second delivery was vaginal.   colonscopy  01/2020   x 3   CYST EXCISION  1999   Epidermal inclusion cyst in right groin (Dr. Mikell)   GROIN DEBRIDEMENT  1999   cyst removed   LAPAROSCOPY N/A 08/22/2020   Procedure: LAPAROSCOPY OPERATIVE, LYSIS OF ADHESIONS;  Surgeon: [name], MD;  Location: [facility];  Service: Gynecology;  Laterality: N/A;   MYRINGOTOMY WITH TUBE PLACEMENT Left 08/2022    FAMILY HISTORY:  We obtained a detailed, 4-generation family history.  Significant diagnoses are listed below: Family History  Problem Relation Age of Onset   Diabetes Mother    Breast cancer Mother        dx 18s   Heart disease Father        first MI age 75   Hypertension Father    Cancer Father        myelodysplastic metaplasia   Breast cancer Paternal Aunt        dx 3 times, in 6s initially   Lung cancer Paternal Aunt    Cervical cancer Paternal Aunt 59   Breast cancer Maternal Grandmother 62   Bone cancer Paternal Grandfather    Melinda Forbes has 2 daughters and 1 son, no cancers.  Melinda Forbes mother had breast cancer in her 52s and is living at 79. Maternal grandmother had breast cancer and passed of it at 46.   Melinda Forbes's father had technogenic myelodypslastic metaplasia and  passed at 75. A paternal aunt had breast cancer 3 times, the first time in her 44s. Paternal aunt had lung cancer in her 79s. Another aunt had cervical cancer and passed at 29. A paternal cousin had NH lymphoma.   Melinda Forbes is unaware of previous family history of genetic testing for hereditary cancer risks. There is no reported Ashkenazi Jewish ancestry. There is no known consanguinity.    GENETIC COUNSELING ASSESSMENT: Melinda Forbes is a 58 y.o. female with a personal and family history of breaste cancer which is somewhat suggestive of a hereditary cancer syndrome and predisposition to cancer. We, therefore, discussed and recommended the following at today's visit.   DISCUSSION: We discussed that, in general, most cancer is not inherited in families, but instead is sporadic or familial. Sporadic cancers occur by chance and typically happen at older ages (>50 years) as this type of cancer is caused by genetic changes acquired during an  individual's lifetime. Some families have more cancers than would be expected by chance; however, the ages or types of cancer are not consistent with a known genetic mutation or known genetic mutations have been ruled out. This type of familial cancer is thought to be due to a combination of multiple genetic, environmental, hormonal, and lifestyle factors. While this combination of factors likely increases the risk of cancer, the exact source of this risk is not currently identifiable or testable.    We discussed that approximately 10% of breast cancer is hereditary. Most cases of hereditary breast cancer are associated with BRCA1/BRCA2 genes, although there are other genes associated with hereditary cancer as well. Cancers and risks are gene specific. We discussed that testing is beneficial for several reasons including knowing about cancer risks, identifying potential screening and risk-reduction options that may be appropriate, and to understand if other family members could  be at risk for cancer and allow them to undergo genetic testing.   We reviewed the characteristics, features and inheritance patterns of hereditary cancer syndromes. We also discussed genetic testing, including the appropriate family members to test, the process of testing, insurance coverage and turn-around-time for results. We discussed the implications of a negative, positive and/or variant of uncertain significant result. We recommended Melinda Forbes pursue genetic testing for the [facility] CancerNext-Expanded+RNA  gene panel.   The CancerNext-Expanded gene panel offered by [facility] and includes sequencing, rearrangement, and RNA analysis for the following 77 genes: AIP, ALK, APC, ATM, AXIN2, BAP1, BARD1, BMPR1A, BRCA1, BRCA2, BRIP1, CDC73, CDH1, CDK4, CDKN1B, CDKN2A, CEBPA, CHEK2, CTNNA1, DDX41, DICER1, ETV6, FH, FLCN, GATA2, LZTR1, MAX, MBD4, MEN1, MET, MLH1, MSH2, MSH3, MSH6, MUTYH, NF1, NF2, NTHL1, PALB2, PHOX2B, PMS2, POT1, PRKAR1A, PTCH1, PTEN, RAD51C, RAD51D, RB1, RET, RPS20, RUNX1, SDHA, SDHAF2, SDHB, SDHC, SDHD, SMAD4, SMARCA4, SMARCB1, SMARCE1, STK11, SUFU, TMEM127, TP53, TSC1, TSC2, VHL, and WT1 (sequencing and deletion/duplication); EGFR, HOXB13, KIT, MITF, PDGFRA, POLD1, and POLE (sequencing only); EPCAM and GREM1 (deletion/duplication only).   Based on Melinda Forbes's personal and family history of cancer, she meets medical criteria for genetic testing. Despite that she meets criteria, she may still have an out of pocket cost.   PLAN: After considering the risks, benefits, and limitations, Melinda Forbes provided informed consent to pursue genetic testing and the blood sample was sent to [facility] for analysis of the CancerNext-Expanded+RNA Panel. Results should be available within approximately 2-3 weeks' time, at which point they will be disclosed by telephone to Melinda Forbes, as will any additional recommendations warranted by these results. Melinda Forbes will receive a summary of her genetic  counseling visit and a copy of her results once available. This information will also be available in Epic.   Melinda Forbes questions were answered to her satisfaction today. Our contact information was provided should additional questions or concerns arise. Thank you for the referral and allowing us  to share in the care of your patient.   Dena Cary, MS, [facility] Genetic Counselor [email or web address] Phone: [phone]  40 minutes were spent on the date of the encounter in service to the patient including preparation, virtual consultation, documentation and care coordination. Dr. Delinda was available for discussion regarding this case.   _______________________________________________________________________ For Office Staff:  Number of people involved in session: 1 Was an Intern/ student involved with case: no

## 2024-01-13 NOTE — Progress Notes (Addendum)
 Patient Care Team: [name], MD as PCP - General (Family Medicine) [name], MD as Consulting Physician (Endocrinology) [name], MD as Consulting Physician (Plastic Surgery) [name], MD as Consulting Physician (General Surgery) [name], RN as Oncology Nurse Navigator [name], Devere HERO, RN as Oncology Nurse Navigator Odean Potts, MD as Consulting Physician (Hematology and Oncology) [name], MD as Consulting Physician (General Surgery) [name], MD as Consulting Physician (Radiation Oncology)  DIAGNOSIS:  Encounter Diagnosis  Name Primary?   Malignant neoplasm of upper-outer quadrant of right breast in female, estrogen receptor positive (HCC) Yes    SUMMARY OF ONCOLOGIC HISTORY: Oncology History  Malignant neoplasm of upper-outer quadrant of right breast in female, estrogen receptor positive (HCC)  12/09/2023 Initial Diagnosis   Palpable mass in the right breast measured 1.6 cm by mammogram and ultrasound.  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, margins negative, 1/3 lymph node with micrometastatic disease, anterior margin excision: Focus of IDC grade 2 with DCIS final margins negative   12/29/2023 Cancer Staging   Staging form: Breast, AJCC 8th Edition - Pathologic stage from 12/29/2023: Stage IB (pT2, pN49mi(sn), cM0, G2, ER+, PR+, HER2-) - Signed by [name], PA-C on 12/29/2023 Stage prefix: Initial diagnosis Method of lymph node assessment: Sentinel lymph node biopsy Histologic grading system: 3 grade system     CHIEF COMPLIANT: Follow-up to discuss results of Oncotype DX and come up with treatment plan  HISTORY OF PRESENT ILLNESS: Ms. He is a 58 year old with above-mentioned history of breast cancer who underwent lumpectomy and we performed Oncotype DX testing and she is here today accompanied her family  to discuss the results.  The Oncotype test came back at 44 which was high risk and we are here to discuss chemotherapy options.  She is healing recovering from surgery fairly well.      ALLERGIES:  is allergic to codeine, morphine  sulfate, and other.  MEDICATIONS:  Current Outpatient Medications  Medication Sig Dispense Refill   busPIRone HCl (BUSPAR PO) Take by mouth.     Calcium Carbonate-Vit D-Min (CALCIUM 1200 PO) Take 1 tablet by mouth at bedtime.      Cranberry 125 MG TABS Take by mouth.     docusate sodium (COLACE) 100 MG capsule Take 100 mg by mouth 2 (two) times daily.     esomeprazole  (NEXIUM ) 20 MG capsule at bedtime.     famotidine  (PEPCID ) 20 MG tablet Take 20 mg by mouth at bedtime.     levothyroxine  (SYNTHROID ) 88 MCG tablet Take 1 tablet (88 mcg total) by mouth daily before breakfast.     Multiple Vitamin (MULTIVITAMIN) tablet Take 1 tablet by mouth daily.     Naproxen Sod-diphenhydrAMINE  (ALEVE PM PO) Take 80 mg by mouth daily.     Psyllium Fiber 0.52 g CAPS Take 0.52 g by mouth daily.     RESTASIS 0.05 % ophthalmic emulsion Place into both eyes.     Zinc 50 MG TABS Take by mouth.     No current facility-administered medications for this visit.    PHYSICAL EXAMINATION: ECOG PERFORMANCE STATUS: 1 - Symptomatic but completely ambulatory  Vitals:   01/13/24 1217  BP: 134/74  Pulse: (!) 57  Resp: 18  Temp: 98.3 F (36.8 C)  SpO2: 100%   Filed Weights   01/13/24 1217  Weight: 124 lb 8  oz (56.5 kg)      LABORATORY DATA:  I have reviewed the data as listed    Latest Ref Rng & Units 08/19/2020    8:51 AM 12/03/2019    3:00 AM 12/02/2019    3:06 AM  CMP  Glucose 70 - 99 mg/dL 64  896  82   BUN 6 - 20 mg/dL 13  6  7    Creatinine 0.44 - 1.00 mg/dL 9.24  9.31  9.34   Sodium 135 - 145 mmol/L 139  140  140   Potassium 3.5 - 5.1 mmol/L 3.9  3.3  3.7   Chloride 98 - 111 mmol/L 106  106  106   CO2 22 - 32 mmol/L 25  25  24    Calcium 8.9 - 10.3 mg/dL 9.1  8.2  8.2    Total Protein 6.5 - 8.1 g/dL  5.7    Total Bilirubin 0.3 - 1.2 mg/dL  0.3    Alkaline Phos 38 - 126 U/L  41    AST 15 - 41 U/L  12    ALT 0 - 44 U/L  7      Lab Results  Component Value Date   WBC 6.0 08/19/2020   HGB 14.3 08/19/2020   HCT 43.6 08/19/2020   MCV 89.3 08/19/2020   PLT 227 08/19/2020   NEUTROABS 3.8 12/03/2019    ASSESSMENT & PLAN:  Malignant neoplasm of upper-outer quadrant of right breast in female, estrogen receptor positive (HCC) 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, anterior margin excision: Focus of IDC grade 2 with DCIS final margins negative  Oncotype DX: Recurrence score 31 (18% risk of recurrence)  01/05/2024: CT CAP and bone scan: Simple cysts in the liver largest 1.4 cm, several sub-5 mm ill-defined foci too small to characterize, few prominent right axillary lymph nodes, seroma right axilla 4.2 cm, ER 100%, PR 10%, HER2 1+ negative, Ki67 20%  Recommendation: Adjuvant chemotherapy with dose dense Adriamycin and Cytoxan x 4 followed by Taxol x 12 Adjuvant radiation therapy Antiestrogen therapy  Chemotherapy Counseling: I discussed the risks and benefits of chemotherapy including the risks of nausea/ vomiting, risk of infection from low WBC count, fatigue due to chemo or anemia, bruising or bleeding due to low platelets, mouth sores, loss/ change in taste and decreased appetite. Liver and kidney function will be monitored through out chemotherapy as abnormalities in liver and kidney function may be a side effect of treatment. Cardiac dysfunction due to Adriamycin and neuropathy risk from Taxol were discussed in detail. Risk of permanent bone marrow dysfunction and leukemia due to chemo were also discussed.  Plan: Echo, chemo class, port placement, start chemotherapy in 2 weeks  CT CAP and bone scan have been ordered to evaluate for metastatic disease given  her abdominal and back symptoms with the new diagnosis of breast cancer    No orders of the defined types were placed in this encounter.  The patient has a good understanding of the overall plan. she agrees with it. she will call with any problems that may develop before the next visit here. Total time spent: 45 mins including face to face time and time spent for planning, charting and co-ordination of care   Viinay K Mattie Novosel, MD 01/13/24

## 2024-01-14 ENCOUNTER — Other Ambulatory Visit: Payer: Self-pay | Admitting: *Deleted

## 2024-01-14 ENCOUNTER — Other Ambulatory Visit: Payer: Self-pay

## 2024-01-14 ENCOUNTER — Ambulatory Visit (HOSPITAL_COMMUNITY)
Admission: RE | Admit: 2024-01-14 | Discharge: 2024-01-14 | Disposition: A | Discharge status: Home / Self Care | Source: Ambulatory Visit | Attending: Hematology and Oncology | Admitting: Hematology and Oncology

## 2024-01-14 DIAGNOSIS — Z79899 Other long term (current) drug therapy: Secondary | ICD-10-CM | POA: Insufficient documentation

## 2024-01-14 DIAGNOSIS — I371 Nonrheumatic pulmonary valve insufficiency: Secondary | ICD-10-CM | POA: Insufficient documentation

## 2024-01-14 DIAGNOSIS — Z170 Estrogen receptor positive status [ER+]: Secondary | ICD-10-CM | POA: Diagnosis not present

## 2024-01-14 DIAGNOSIS — C50411 Malignant neoplasm of upper-outer quadrant of right female breast: Secondary | ICD-10-CM | POA: Diagnosis not present

## 2024-01-14 DIAGNOSIS — Z01818 Encounter for other preprocedural examination: Secondary | ICD-10-CM | POA: Insufficient documentation

## 2024-01-14 DIAGNOSIS — Z5181 Encounter for therapeutic drug level monitoring: Secondary | ICD-10-CM | POA: Insufficient documentation

## 2024-01-14 LAB — ECHOCARDIOGRAM COMPLETE
Area-P 1/2: 4.68 cm2
S' Lateral: 2.5 cm

## 2024-01-14 NOTE — Progress Notes (Signed)
  Echocardiogram 2D Echocardiogram has been performed.  Melinda Forbes, RDCS 01/14/2024, 1:47 PM

## 2024-01-18 ENCOUNTER — Inpatient Hospital Stay

## 2024-01-18 ENCOUNTER — Encounter: Payer: Self-pay | Admitting: *Deleted

## 2024-01-18 ENCOUNTER — Other Ambulatory Visit: Payer: Self-pay | Admitting: Hematology and Oncology

## 2024-01-18 ENCOUNTER — Other Ambulatory Visit: Payer: Self-pay | Admitting: Pharmacist

## 2024-01-18 ENCOUNTER — Inpatient Hospital Stay: Admitting: Pharmacist

## 2024-01-18 DIAGNOSIS — Z79630 Long term (current) use of alkylating agent: Secondary | ICD-10-CM | POA: Diagnosis not present

## 2024-01-18 DIAGNOSIS — Z170 Estrogen receptor positive status [ER+]: Secondary | ICD-10-CM | POA: Diagnosis present

## 2024-01-18 DIAGNOSIS — Z79632 Long term (current) use of antitumor antibiotic: Secondary | ICD-10-CM | POA: Diagnosis not present

## 2024-01-18 DIAGNOSIS — C50411 Malignant neoplasm of upper-outer quadrant of right female breast: Secondary | ICD-10-CM | POA: Diagnosis present

## 2024-01-18 DIAGNOSIS — Z5189 Encounter for other specified aftercare: Secondary | ICD-10-CM | POA: Diagnosis not present

## 2024-01-18 DIAGNOSIS — Z5111 Encounter for antineoplastic chemotherapy: Secondary | ICD-10-CM | POA: Diagnosis present

## 2024-01-18 NOTE — Progress Notes (Unsigned)
 [facility]       Telephone: [phone]?Fax: [phone]   Oncology Clinical Pharmacist Practitioner Initial Assessment  Melinda Forbes is a 58 y.o. female with a diagnosis of breast cancer. They were contacted today via {JAIVISITTYPE:26375::in-person} visit.  Indication/Regimen Doxorubicin (Adriamycin) and cyclophosphamide (Cytoxan) followed by paclitaxel (Taxol) are being used appropriately for treatment of breast cancer by Dr. KATHERNE.      Wt Readings from Last 1 Encounters:  01/13/24 124 lb 8 oz (56.5 kg)    Estimated body surface area is 1.58 meters squared as calculated from the following:   Height as of 01/13/24: 5' 3 (1.6 m).   Weight as of 01/13/24: 124 lb 8 oz (56.5 kg).  The dosing regimen is every 14 days for 4 cycles  Doxorubicin (60 mg/m2) on Day 1 Cyclophosphamide (600 mg/m2) on Day 1 Pegfilgrastim (6 mg) on Day 3  Followed by a dosing regimen that is every 7 days for 12 cycles  Paclitaxel (80 mg/m2) on Day 1  It is planned to continue until {JAI Disease Progression or unacceptable toxicity:26362::disease progression or unacceptable toxicity}. The tentative start date is: ***  Dose Modifications ***   Allergies Allergies  Allergen Reactions   Codeine     Other reaction(s): intense vomiting, Other (See Comments)   Morphine  Sulfate Other (See Comments)    Other reaction(s): intense vomiting   Other     NARCOTICS--PROJECTILE VOMITING Other reaction(s): Other (See Comments) NARCOTICS--PROJECTILE VOMITING    Vitals    01/13/2024   12:17 PM 12/30/2023    3:38 PM 12/28/2023    8:40 AM  Oncology Vitals  Height 160 cm  160 cm  Weight 56.473 kg 56.518 kg 57.425 kg  Weight (lbs) 124 lbs 8 oz 124 lbs 10 oz 126 lbs 10 oz  BMI 22.05 kg/m2 22.07 kg/m2 22.43 kg/m2  Temp 98.3 F (36.8 C) 97.6 F (36.4 C) 97.6 F (36.4 C)  Pulse Rate 57 77 58  BP 134/74 120/69 129/89  Resp 18 16 18   SpO2 100 % 97 % 98 %  BSA  (m2) 1.58 m2 1.58 m2 1.6 m2     Laboratory Data    Latest Ref Rng & Units 08/19/2020    8:51 AM 12/03/2019    3:00 AM 12/02/2019    3:06 AM  CBC EXTENDED  WBC 4.0 - 10.5 K/uL 6.0  7.5  6.6   RBC 3.87 - 5.11 MIL/uL 4.88  3.84  3.84   Hemoglobin 12.0 - 15.0 g/dL 85.6  88.6  88.7   HCT 36.0 - 46.0 % 43.6  33.6  34.7   Platelets 150 - 400 K/uL 227  242  218   NEUT# 1.7 - 7.7 K/uL  3.8    Lymph# 0.7 - 4.0 K/uL  2.9         Latest Ref Rng & Units 08/19/2020    8:51 AM 12/03/2019    3:00 AM 12/02/2019    3:06 AM  CMP  Glucose 70 - 99 mg/dL 64  896  82   BUN 6 - 20 mg/dL 13  6  7    Creatinine 0.44 - 1.00 mg/dL 9.24  9.31  9.34   Sodium 135 - 145 mmol/L 139  140  140   Potassium 3.5 - 5.1 mmol/L 3.9  3.3  3.7   Chloride 98 - 111 mmol/L 106  106  106   CO2 22 - 32 mmol/L 25  25  24    Calcium 8.9 -  10.3 mg/dL 9.1  8.2  8.2   Total Protein 6.5 - 8.1 g/dL  5.7    Total Bilirubin 0.3 - 1.2 mg/dL  0.3    Alkaline Phos 38 - 126 U/L  41    AST 15 - 41 U/L  12    ALT 0 - 44 U/L  7     Lab Results  Component Value Date   MG 2.1 12/02/2019   MG 2.2 12/01/2019   No results found for: CA2729   Contraindications Contraindications were reviewed? {yes/no:20286} Contraindications to therapy were identified? {YES/NO:21197}  Safety Precautions The following safety precautions were reviewed:  Fever: reviewed the importance of having a thermometer and the Centers for Disease Control and Prevention (CDC) definition of fever which is 100.18F (38C) or higher. Patient should call 24/7 triage at [phone] if experiencing a fever or any other symptoms Decreased white blood cells (WBCs) and increased risk for infection Decreased platelet count and increased risk of bleeding Decreased hemoglobin, part of the red blood cells that carry iron and oxygen Change in the color of urine Fatigue Hair loss Nausea or vomiting Mouth irritation or sores Doxorubicin (vesicant) Cardiotoxicity Hemorrhagic  cystitis Secondary cancers Muscle or joint pain or weakness Peripheral neuropathy Hypersensitivity reactions Avoid grapefruit products Intimacy, sexual activity, contraception, fertility Handling body fluids and waste ***  Medication Reconciliation Current Outpatient Medications  Medication Sig Dispense Refill   busPIRone HCl (BUSPAR PO) Take by mouth.     Calcium Carbonate-Vit D-Min (CALCIUM 1200 PO) Take 1 tablet by mouth at bedtime.      Cranberry 125 MG TABS Take by mouth.     dexamethasone  (DECADRON ) 4 MG tablet Take 1 tablet day after chemo and 1 tablet 2 days after chemo with food 30 tablet 1   docusate sodium (COLACE) 100 MG capsule Take 100 mg by mouth 2 (two) times daily.     esomeprazole  (NEXIUM ) 20 MG capsule at bedtime.     famotidine  (PEPCID ) 20 MG tablet Take 20 mg by mouth at bedtime.     levothyroxine  (SYNTHROID ) 88 MCG tablet Take 1 tablet (88 mcg total) by mouth daily before breakfast.     lidocaine -prilocaine (EMLA) cream Apply to affected area once 30 g 3   Multiple Vitamin (MULTIVITAMIN) tablet Take 1 tablet by mouth daily.     Naproxen Sod-diphenhydrAMINE  (ALEVE PM PO) Take 80 mg by mouth daily.     ondansetron  (ZOFRAN ) 8 MG tablet Take 1 tab (8 mg) by mouth every 8 hrs as needed for nausea/vomiting. Start third day after doxorubicin/cyclophosphamide chemotherapy. 30 tablet 1   prochlorperazine  (COMPAZINE ) 10 MG tablet Take 1 tablet (10 mg total) by mouth every 6 (six) hours as needed for nausea or vomiting. 30 tablet 1   Psyllium Fiber 0.52 g CAPS Take 0.52 g by mouth daily.     RESTASIS 0.05 % ophthalmic emulsion Place into both eyes.     Zinc 50 MG TABS Take by mouth.     No current facility-administered medications for this visit.    Medication reconciliation is based on the patient's most recent medication list in the electronic medical record (EMR) including herbal products and OTC medications.   The patient's medication list was reviewed today with the  patient? {YES/NO:21197}  Drug-drug interactions (DDIs) DDIs were evaluated? {yes/no:20286} Significant DDIs identified? {YES/NO:21197}  Drug-Food Interactions Drug-food interactions were evaluated? {yes/no:20286} Drug-food interactions identified? {YES/NO:21197}  Follow-up Plan  Treatment start date: *** Port placement date: *** ECHO date: *** We  reviewed the prescriptions, premedications, and treatment regimen with the patient. Possible side effects of the treatment regimen were reviewed and management strategies were discussed.  Can use over-the-counter (OTC) options of loperamide (Imodium) as needed for diarrhea, loratadine (Claritin) as needed for G-CSF bone pain, and docusate + senna (Senna-S) as needed for constipation.  *** Clinical pharmacy will assist Dr. KATHERNE and Daphanie Bunton Speros on an as needed basis going forward  [employer] participated in the discussion, expressed understanding, and voiced agreement with the above plan. All questions were answered to her satisfaction. The patient was advised to contact the clinic at [phone] with any questions or concerns prior to her return visit.   I spent 60 minutes assessing the patient.  Gaspare Netzel A. Lucila, PharmD, BCOP, CPP  Norleen DELENA Lucila, RPH-CPP, 01/18/2024 1:15 PM  **Disclaimer: This note was dictated with voice recognition software. Similar sounding words can inadvertently be transcribed and this note may contain transcription errors which may not have been corrected upon publication of note.**

## 2024-01-19 NOTE — Progress Notes (Signed)
 [facility]       Telephone: [phone]?Fax: [phone]   Oncology Clinical Pharmacist Practitioner Initial Assessment  Melinda Forbes is a 58 y.o. female with a diagnosis of breast cancer. They were contacted today via in-person visit .  Indication/Regimen Doxorubicin (Adriamycin) and cyclophosphamide (Cytoxan) followed by paclitaxel (Taxol) are being used appropriately for treatment of breast cancer by Dr. Vinay Gudena.    Wt Readings from Last 1 Encounters:  01/13/24 56.5 kg (124 lb 8 oz)    Estimated body surface area is 1.58 meters squared as calculated from the following:   Height as of 01/13/24: 5' 3 (1.6 m).   Weight as of 01/13/24: 56.5 kg (124 lb 8 oz).  The dosing regimen is every 14 days for 4 cycles  Doxorubicin (60 mg/m2) on Day 1 Cyclophosphamide (600 mg/m2) on Day 1 Pegfilgrastim (6 mg) on Day 3  Followed by a dosing regimen that is every 7 days for 12 cycles  Paclitaxel (80 mg/m2) on Day 1  It is planned to continue until 4 cycles of ddAC and then 12 cycles of Paclitaxel. The tentative start date is: 01/28/2024  Dose Modifications None at this time   Allergies Allergies  Allergen Reactions   Codeine     Other reaction(s): intense vomiting, Other (See Comments)   Morphine  Sulfate Other (See Comments)    Other reaction(s): intense vomiting   Other     NARCOTICS--PROJECTILE VOMITING Other reaction(s): Other (See Comments) NARCOTICS--PROJECTILE VOMITING   Vitals    01/13/2024   12:17 PM 12/30/2023    3:38 PM 12/28/2023    8:40 AM  Oncology Vitals  Height 160 cm  160 cm  Weight 56.473 kg 56.518 kg 57.425 kg  Weight (lbs) 124 lbs 8 oz 124 lbs 10 oz 126 lbs 10 oz  BMI 22.05 kg/m2 22.07 kg/m2 22.43 kg/m2  Temp 98.3 F (36.8 C) 97.6 F (36.4 C) 97.6 F (36.4 C)  Pulse Rate 57 77 58  BP 134/74 120/69 129/89  Resp 18 16 18   SpO2 100 % 97 % 98 %  BSA (m2) 1.58 m2 1.58 m2 1.6 m2    Laboratory Data    Latest Ref Rng &  Units 08/19/2020    8:51 AM 12/03/2019    3:00 AM 12/02/2019    3:06 AM  CBC EXTENDED  WBC 4.0 - 10.5 K/uL 6.0  7.5  6.6   RBC 3.87 - 5.11 MIL/uL 4.88  3.84  3.84   Hemoglobin 12.0 - 15.0 g/dL 85.6  88.6  88.7   HCT 36.0 - 46.0 % 43.6  33.6  34.7   Platelets 150 - 400 K/uL 227  242  218   NEUT# 1.7 - 7.7 K/uL  3.8    Lymph# 0.7 - 4.0 K/uL  2.9         Latest Ref Rng & Units 08/19/2020    8:51 AM 12/03/2019    3:00 AM 12/02/2019    3:06 AM  CMP  Glucose 70 - 99 mg/dL 64  896  82   BUN 6 - 20 mg/dL 13  6  7    Creatinine 0.44 - 1.00 mg/dL 9.24  9.31  9.34   Sodium 135 - 145 mmol/L 139  140  140   Potassium 3.5 - 5.1 mmol/L 3.9  3.3  3.7   Chloride 98 - 111 mmol/L 106  106  106   CO2 22 - 32 mmol/L 25  25  24    Calcium  8.9 - 10.3 mg/dL 9.1  8.2  8.2   Total Protein 6.5 - 8.1 g/dL  5.7    Total Bilirubin 0.3 - 1.2 mg/dL  0.3    Alkaline Phos 38 - 126 U/L  41    AST 15 - 41 U/L  12    ALT 0 - 44 U/L  7     Lab Results  Component Value Date   MG 2.1 12/02/2019   MG 2.2 12/01/2019   No results found for: CA2729   Contraindications Contraindications were reviewed? Yes Contraindications to therapy were identified? No   Safety Precautions The following safety precautions were reviewed:  Fever: reviewed the importance of having a thermometer and the Centers for Disease Control and Prevention (CDC) definition of fever which is 100.43F (38C) or higher. Patient should call 24/7 triage at [phone] if experiencing a fever or any other symptoms Decreased white blood cells (WBCs) and increased risk for infection Decreased platelet count and increased risk of bleeding Decreased hemoglobin, part of the red blood cells that carry iron and oxygen Change in the color of urine Fatigue Hair loss Nausea or vomiting Mouth irritation or sores Doxorubicin (vesicant) Cardiotoxicity Hemorrhagic cystitis Secondary cancers Muscle or joint pain or weakness Peripheral  neuropathy Hypersensitivity reactions Avoid grapefruit products Intimacy, sexual activity, contraception, fertility Handling body fluids and waste  Medication Reconciliation Current Outpatient Medications  Medication Sig Dispense Refill   busPIRone HCl (BUSPAR PO) Take by mouth.     Calcium Carbonate-Vit D-Min (CALCIUM 1200 PO) Take 1 tablet by mouth at bedtime.      Cranberry 125 MG TABS Take by mouth.     dexamethasone  (DECADRON ) 4 MG tablet Take 1 tablet day after chemo and 1 tablet 2 days after chemo with food 30 tablet 1   docusate sodium (COLACE) 100 MG capsule Take 100 mg by mouth 2 (two) times daily.     esomeprazole  (NEXIUM ) 20 MG capsule at bedtime.     famotidine  (PEPCID ) 20 MG tablet Take 20 mg by mouth at bedtime.     levothyroxine  (SYNTHROID ) 88 MCG tablet Take 1 tablet (88 mcg total) by mouth daily before breakfast.     lidocaine -prilocaine (EMLA) cream Apply to affected area once 30 g 3   Multiple Vitamin (MULTIVITAMIN) tablet Take 1 tablet by mouth daily.     Naproxen Sod-diphenhydrAMINE  (ALEVE PM PO) Take 80 mg by mouth daily.     ondansetron  (ZOFRAN ) 8 MG tablet Take 1 tab (8 mg) by mouth every 8 hrs as needed for nausea/vomiting. Start third day after doxorubicin/cyclophosphamide chemotherapy. 30 tablet 1   prochlorperazine  (COMPAZINE ) 10 MG tablet Take 1 tablet (10 mg total) by mouth every 6 (six) hours as needed for nausea or vomiting. 30 tablet 1   Psyllium Fiber 0.52 g CAPS Take 0.52 g by mouth daily.     RESTASIS 0.05 % ophthalmic emulsion Place into both eyes.     Zinc 50 MG TABS Take by mouth.     No current facility-administered medications for this visit.   Medication reconciliation is based on the patient's most recent medication list in the electronic medical record (EMR) including herbal products and OTC medications.   The patient's medication list was reviewed today with the patient?   Drug-drug interactions (DDIs) DDIs were evaluated? Yes   Significant DDIs identified? Yes  Drug-Food Interactions Drug-food interactions were evaluated? No Drug-food interactions identified? Yes, counseled patient on avoiding grapefruit containing food items. They also reported starting to take  Vitamin E. Counseled them to not take it because it has a remote possibly of DDI with their chemotherapy.   American [employer] Society article: https://www.cancer.org/cancer/latest-news/study-finds-antioxidants-risky-during-breast-cancer-chemotherapy.html  Follow-up Plan  Treatment start date: 01/28/2024 Port placement date: 01/14/2024 ECHO date: 01/14/2024 We reviewed the prescriptions, premedications, and treatment regimen with the patient. Possible side effects of the treatment regimen were reviewed and management strategies were discussed.  Can use over-the-counter (OTC) options of loperamide (Imodium) as needed for diarrhea, loratadine (Claritin) as needed for G-CSF bone pain, and docusate + senna (Senna-S) as needed for constipation.  Clinical pharmacy will assist Dr. Vinay Gudena and Melinda Forbes on an as needed basis going forward  [employer] participated in the discussion, expressed understanding, and voiced agreement with the above plan. All questions were answered to her satisfaction. The patient was advised to contact the clinic at [phone] with any questions or concerns prior to her return visit.   I spent 60 minutes assessing the patient.  John A. Lucila, PharmD, BCOP, CPP  Melinda Forbes, [facility], 01/19/2024 8:48 AM  **Disclaimer: This note was dictated with voice recognition software. Similar sounding words can inadvertently be transcribed and this note may contain transcription errors which may not have been corrected upon publication of note.**

## 2024-01-20 ENCOUNTER — Encounter: Payer: Self-pay | Admitting: Hematology and Oncology

## 2024-01-20 ENCOUNTER — Other Ambulatory Visit (HOSPITAL_COMMUNITY): Payer: Self-pay

## 2024-01-21 NOTE — Progress Notes (Signed)
 Pharmacist Chemotherapy Monitoring - Initial Assessment    Anticipated start date: 01/28/24   The following has been reviewed per standard work regarding the patient's treatment regimen: The patient's diagnosis, treatment plan and drug doses, and organ/hematologic function Lab orders and baseline tests specific to treatment regimen  The treatment plan start date, drug sequencing, and pre-medications Prior authorization status  Patient's documented medication list, including drug-drug interaction screen and prescriptions for anti-emetics and supportive care specific to the treatment regimen The drug concentrations, fluid compatibility, administration routes, and timing of the medications to be used The patient's access for treatment and lifetime cumulative dose history, if applicable  The patient's medication allergies and previous infusion related reactions, if applicable   Changes made to treatment plan:  N/A  Follow up needed:  Pending authorization for treatment  Port placement scheduled 01/27/24  Harlene JONELLE Nasuti, RPH, 01/21/2024  12:05 PM

## 2024-01-24 ENCOUNTER — Telehealth: Payer: Self-pay | Admitting: *Deleted

## 2024-01-24 NOTE — Telephone Encounter (Signed)
 Spoke with patient regarding upcoming chemo appts.  Reviewed appts. Patent would like to keep her chemo appts on Tuesdays if possible.  Schedulers made aware.

## 2024-01-26 ENCOUNTER — Other Ambulatory Visit: Payer: Self-pay

## 2024-01-27 ENCOUNTER — Ambulatory Visit (HOSPITAL_COMMUNITY)
Admission: RE | Admit: 2024-01-27 | Discharge: 2024-01-27 | Disposition: A | Discharge status: Home / Self Care | Source: Ambulatory Visit | Attending: Hematology and Oncology | Admitting: Hematology and Oncology

## 2024-01-27 ENCOUNTER — Other Ambulatory Visit: Payer: Self-pay

## 2024-01-27 ENCOUNTER — Encounter (HOSPITAL_COMMUNITY): Payer: Self-pay

## 2024-01-27 ENCOUNTER — Telehealth: Payer: Self-pay | Admitting: Licensed Clinical Social Worker

## 2024-01-27 DIAGNOSIS — C50411 Malignant neoplasm of upper-outer quadrant of right female breast: Secondary | ICD-10-CM | POA: Diagnosis present

## 2024-01-27 DIAGNOSIS — Z170 Estrogen receptor positive status [ER+]: Secondary | ICD-10-CM | POA: Insufficient documentation

## 2024-01-27 HISTORY — PX: IR IMAGING GUIDED PORT INSERTION: IMG5740

## 2024-01-27 MED ORDER — DIPHENHYDRAMINE HCL 50 MG/ML IJ SOLN
INTRAMUSCULAR | Status: AC | PRN
  Administered 2024-01-27: 50 mg via INTRAVENOUS

## 2024-01-27 MED ORDER — HEPARIN SOD (PORK) LOCK FLUSH 100 UNIT/ML IV SOLN
INTRAVENOUS | Status: AC
  Filled 2024-01-27: qty 5

## 2024-01-27 MED ORDER — HEPARIN SOD (PORK) LOCK FLUSH 100 UNIT/ML IV SOLN
500.0000 [IU] | Freq: Once | INTRAVENOUS | Status: AC
  Administered 2024-01-27: 500 [IU] via INTRAVENOUS

## 2024-01-27 MED ORDER — LIDOCAINE HCL 1 % IJ SOLN
INTRAMUSCULAR | Status: AC
  Filled 2024-01-27: qty 20

## 2024-01-27 MED ORDER — SODIUM CHLORIDE 0.9 % IV SOLN: INTRAVENOUS | Status: DC

## 2024-01-27 MED ORDER — LIDOCAINE HCL 1 % IJ SOLN
20.0000 mL | Freq: Once | INTRAMUSCULAR | Status: AC
  Administered 2024-01-27: 15 mL via INTRADERMAL

## 2024-01-27 MED ORDER — DIPHENHYDRAMINE HCL 50 MG/ML IJ SOLN
INTRAMUSCULAR | Status: AC
  Filled 2024-01-27: qty 1

## 2024-01-27 MED ORDER — MIDAZOLAM HCL (PF) 2 MG/2ML IJ SOLN
INTRAMUSCULAR | Status: AC | PRN
  Administered 2024-01-27: 1 mg via INTRAVENOUS
  Administered 2024-01-27: 2 mg via INTRAVENOUS
  Administered 2024-01-27: 1 mg via INTRAVENOUS

## 2024-01-27 MED ORDER — MIDAZOLAM HCL 2 MG/2ML IJ SOLN
INTRAMUSCULAR | Status: AC
  Filled 2024-01-27: qty 4

## 2024-01-27 NOTE — Procedures (Signed)
 Interventional Radiology Procedure Note  Procedure: chest port placement Complications: None  Estimated Blood Loss: < 10 mL  Findings: RIJ chest port placement.  Tip at cavoatrial junction  Cordella DELENA Banner, MD

## 2024-01-27 NOTE — H&P (Signed)
 Chief Complaint: Newly diagnosed breast cancer - IR consulted for port-a-catheter placement for chemotherapy  Referring Provider(s): Gudena, Vinay, MD   Supervising Physician: Jenna Hacker  Patient Status: [facility] - Out-pt  History of Present Illness: Melinda Forbes is a 58 y.o. female with newly diagnosed right breast cancer. First found 12/09/23 with palpable mass in the right breast and measured 1.6 cm by mammogram and ultrasound. 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 Pt has established care with Dr. Odean of oncology, plan is for chemotherapy. Pt now presents to IR service for port-a-catheter placement.  Today patient  without complaint. Has been NPO besides small sip of clear fluids this AM.   Patient is Full Code  Past Medical History:  Diagnosis Date   Breast cancer (HCC) 11/2023   Right   Bursitis left hip   Chronic throat clearing    ENT eval 2008 by Dr. Ethyl showed normal vocal cords and  slight edema of the arytenoid mucosa possibly indicative of globus type sx's and possible reflux symptoms.   Complication of anesthesia    COVID 03/04/2020   congestion loss of taste and smell x 7 days, took monoclonal antibody infusion   GERD (gastroesophageal reflux disease)    omeprazole  daily helps   Hashimoto's thyroiditis    History of bronchitis    Hypothyroidism    managed by endocrinologist ([name])   Pelvic congestion syndrome    on left side   Perforation bowel (HCC)    colon perforation 11-30-2019 hospitialized, and march 2022   Pericardial effusion    PONV (postoperative nausea and vomiting)    nausea   Wears glasses    for reading    Past Surgical History:  Procedure Laterality Date   BREAST BIOPSY Right 12/09/2023   US  RT BREAST BX W LOC DEV 1ST LESION IMG BX SPEC US  GUIDE 12/09/2023 [facility]MOGR[facility]   BREAST BIOPSY  12/21/2023   US  RT RADIOACTIVE SEED LOC 12/21/2023  [facility]AMMOGR[facility]   BREAST LUMPECTOMY WITH RADIOACTIVE SEED AND SENTINEL LYMPH NODE BIOPSY Right 12/23/2023   Procedure: RIGHT BREAST LUMPECTOMY WITH RADIOACTIVE SEED AND SENTINEL LYMPH NODE BIOPSY;  Surgeon: [name], MD;  Location: [facility];  Service: General;  Laterality: Right;  RIGHT BREAST RADIOACTIVE SEED LOCALIZED LUMPECTOMY SENTINEL NODE BIOPSY   CESAREAN SECTION  1988   first pregnancy.  Her second delivery was vaginal.   colonscopy  01/2020   x 3   CYST EXCISION  1999   Epidermal inclusion cyst in right groin (Dr. Mikell)   GROIN DEBRIDEMENT  1999   cyst removed   LAPAROSCOPY N/A 08/22/2020   Procedure: LAPAROSCOPY OPERATIVE, LYSIS OF ADHESIONS;  Surgeon: [name], MD;  Location: [facility];  Service: Gynecology;  Laterality: N/A;   MYRINGOTOMY WITH TUBE PLACEMENT Left 08/2022    Allergies: Codeine, Morphine  sulfate, and Other  Medications: Prior to Admission medications   Medication Sig Start Date End Date Taking? Authorizing Provider  busPIRone HCl (BUSPAR PO) Take by mouth.   Yes [provider]  Calcium Carbonate-Vit D-Min (CALCIUM 1200 PO) Take 1 tablet by mouth at bedtime.     [provider]  Cranberry 125 MG TABS Take by mouth.    [provider]  dexamethasone  (DECADRON ) 4 MG tablet Take 1 tablet day after chemo and 1 tablet 2 days after chemo with food 01/13/24   Gudena, Vinay, MD  docusate sodium (COLACE) 100 MG capsule Take 100 mg by mouth 2 (two) times daily.    [provider]  esomeprazole  (NEXIUM ) 20 MG capsule at bedtime.    [provider]  famotidine  (PEPCID ) 20 MG tablet Take 20 mg by mouth at bedtime.    [provider]  levothyroxine  (SYNTHROID ) 88 MCG tablet Take 1 tablet (88 mcg total) by mouth daily before breakfast. 12/04/19   Swayze, Ava, DO  lidocaine -prilocaine (EMLA) cream Apply to affected area once 01/13/24   Gudena, Vinay, MD  Multiple Vitamin  (MULTIVITAMIN) tablet Take 1 tablet by mouth daily.    [provider]  Naproxen Sod-diphenhydrAMINE  (ALEVE PM PO) Take 80 mg by mouth daily.    [provider]  ondansetron  (ZOFRAN ) 8 MG tablet Take 1 tab (8 mg) by mouth every 8 hrs as needed for nausea/vomiting. Start third day after doxorubicin/cyclophosphamide chemotherapy. 01/13/24   Gudena, Vinay, MD  prochlorperazine  (COMPAZINE ) 10 MG tablet Take 1 tablet (10 mg total) by mouth every 6 (six) hours as needed for nausea or vomiting. 01/13/24   Gudena, Vinay, MD  Psyllium Fiber 0.52 g CAPS Take 0.52 g by mouth daily.    [provider]  RESTASIS 0.05 % ophthalmic emulsion Place into both eyes. 02/13/20   [provider]  Zinc 50 MG TABS Take by mouth.    [provider]     Family History  Problem Relation Age of Onset   Diabetes Mother    Breast cancer Mother        dx 102s   Heart disease Father        first MI age 35   Hypertension Father    Cancer Father        myelodysplastic metaplasia   Breast cancer Paternal Aunt        dx 3 times, in 38s initially   Lung cancer Paternal Aunt    Cervical cancer Paternal Aunt 105   Breast cancer Maternal Grandmother 63   Bone cancer Paternal Grandfather     Social History   Socioeconomic History   Marital status: Married    Spouse name: Not on file   Number of children: 3   Years of education: Not on file   Highest education level: Not on file  Occupational History   Not on file  Tobacco Use   Smoking status: Never   Smokeless tobacco: Never  Vaping Use   Vaping status: Never Used  Substance and Sexual Activity   Alcohol use: No   Drug use: No   Sexual activity: Not on file  Other Topics Concern   Not on file  Social History Narrative   Divorced, engaged.     Three grown children.   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  Bachelor's degree from [place]  state.     She is originally from [place].   No T/A/Ds.   Exercise: walks a  lot (1-2 times per week).   She tries to eat healthy.   Social Drivers of Corporate investment banker Strain: Not on file  Food Insecurity: No Food Insecurity (12/30/2023)   Hunger Vital Sign    Worried About Running Out of Food in the Last Year: Never true    Ran Out of Food in the Last Year: Never true  Transportation Needs: No Transportation Needs (12/30/2023)   PRAPARE - Administrator, Civil Service (Medical): No    Lack of Transportation (Non-Medical): No  Physical Activity: Not on file  Stress: Not on file  Social Connections: Unknown (05/15/2022)   Received from [facility]   Social Network    Social Network: Not on file     Review of Systems: A 12 point ROS discussed and pertinent positives are indicated in the HPI above.  All other systems are negative.   Vital Signs: BP (!) 141/82 (BP Location: Left Arm)   Pulse (!) 57   Temp 97.7 F (36.5 C) (Oral)   Resp 16   SpO2 98%   Advance Care Plan: No documents on file  Physical Exam Vitals and nursing note reviewed.  Constitutional:      Appearance: Normal appearance.  HENT:     Mouth/Throat:     Mouth: Mucous membranes are moist.     Pharynx: Oropharynx is clear.  Cardiovascular:     Rate and Rhythm: Normal rate and regular rhythm.  Pulmonary:     Effort: Pulmonary effort is normal.     Breath sounds: Normal breath sounds.  Abdominal:     Palpations: Abdomen is soft.     Tenderness: There is no abdominal tenderness.  Musculoskeletal:     Right lower leg: No edema.     Left lower leg: No edema.  Skin:    General: Skin is warm and dry.  Neurological:     Mental Status: She is alert and oriented to person, place, and time. Mental status is at baseline.     Imaging: ECHOCARDIOGRAM COMPLETE Result Date: 01/14/2024    ECHOCARDIOGRAM REPORT   Patient Name:   Melinda Forbes Date of Exam: 01/14/2024 Medical Rec #:  [id number]          Height:       63.0 in Accession #:    [id number]          Weight:       124.5 lb Date of Birth:  1965-10-26          BSA:          1.581 m Patient Age:    58 years           BP:           145/74 mmHg Patient Gender: F                  HR:           55 bpm. Exam Location:  Outpatient Procedure: 2D Echo, 3D Echo, Strain Analysis, Cardiac Doppler and Color Doppler            (Both Spectral and Color Flow Doppler were utilized during            procedure). Indications:    Chemo Z09  History:        Patient has prior history of Echocardiogram examinations, most                 recent 05/22/2022. Oncology/Cancer; Risk Factors:Chemotherpy                 Treatment.  Sonographer:    Koleen Popper RDCS Referring Phys: [id number] MACKEY CHAD  Sonographer Comments: Global longitudinal strain was attempted. IMPRESSIONS  1. Left ventricular ejection fraction, by estimation, is 60 to 65%. Left ventricular ejection fraction by 3D volume is 62 %. The left ventricle has normal function. The left ventricle has no regional wall motion abnormalities. Left ventricular diastolic  parameters were normal.  2. Right ventricular systolic function is normal. The right  ventricular size is normal.  3. The mitral valve is normal in structure. No evidence of mitral valve regurgitation. No evidence of mitral stenosis.  4. The aortic valve is tricuspid. Aortic valve regurgitation is not visualized. No aortic stenosis is present.  5. The inferior vena cava is normal in size with greater than 50% respiratory variability, suggesting right atrial pressure of 3 mmHg. Comparison(s): No prior Echocardiogram. FINDINGS  Left Ventricle: Left ventricular ejection fraction, by estimation, is 60 to 65%. Left ventricular ejection fraction by 3D volume is 62 %. The left ventricle has normal function. The left ventricle has no regional wall motion abnormalities. The left ventricular internal cavity size was normal in size. There is no left ventricular hypertrophy. Left ventricular diastolic parameters were normal. Right  Ventricle: The right ventricular size is normal. No increase in right ventricular wall thickness. Right ventricular systolic function is normal. Left Atrium: Left atrial size was normal in size. Right Atrium: Right atrial size was normal in size. Pericardium: Trivial pericardial effusion is present. Mitral Valve: The mitral valve is normal in structure. No evidence of mitral valve regurgitation. No evidence of mitral valve stenosis. Tricuspid Valve: The tricuspid valve is normal in structure. Tricuspid valve regurgitation is not demonstrated. No evidence of tricuspid stenosis. Aortic Valve: The aortic valve is tricuspid. Aortic valve regurgitation is not visualized. No aortic stenosis is present. Pulmonic Valve: The pulmonic valve was grossly normal. Pulmonic valve regurgitation is mild. No evidence of pulmonic stenosis. Aorta: The aortic root and ascending aorta are structurally normal, with no evidence of dilitation. Venous: The inferior vena cava is normal in size with greater than 50% respiratory variability, suggesting right atrial pressure of 3 mmHg. IAS/Shunts: The atrial septum is grossly normal. Additional Comments: 3D was performed not requiring image post processing on an independent workstation and was normal.  LEFT VENTRICLE PLAX 2D LVIDd:         4.10 cm         Diastology LVIDs:         2.50 cm         LV e' medial:    10.00 cm/s LV PW:         0.70 cm         LV E/e' medial:  6.6 LV IVS:        1.20 cm         LV e' lateral:   13.50 cm/s LVOT diam:     2.20 cm         LV E/e' lateral: 4.9 LV SV:         72 LV SV Index:   46 LVOT Area:     3.80 cm        3D Volume EF                                LV 3D EF:    Left                                             ventricul                                             ar  ejection                                             fraction                                             by 3D                                              volume is                                             62 %.                                 3D Volume EF:                                3D EF:        62 %                                LV EDV:       101 ml                                LV ESV:       38 ml                                LV SV:        63 ml RIGHT VENTRICLE             IVC RV Basal diam:  3.10 cm     IVC diam: 1.70 cm RV S prime:     14.30 cm/s TAPSE (M-mode): 2.8 cm LEFT ATRIUM             Index        RIGHT ATRIUM           Index LA diam:        3.10 cm 1.96 cm/m   RA Area:     11.30 cm LA Vol (A2C):   31.7 ml 20.05 ml/m  RA Volume:   22.50 ml  14.23 ml/m LA Vol (A4C):   21.8 ml 13.79 ml/m LA Biplane Vol: 27.3 ml 17.27 ml/m  AORTIC VALVE LVOT Vmax:   92.25 cm/s LVOT Vmean:  57.400 cm/s LVOT VTI:    0.190 m  AORTA Ao Root diam: 2.80 cm Ao Asc diam:  2.90 cm MITRAL VALVE MV Area (PHT): 4.68 cm    SHUNTS MV Decel Time: 162 msec    Systemic VTI:  0.19 m MV E velocity: 66.20 cm/s  Systemic Diam: 2.20 cm MV A velocity: 52.20 cm/s MV E/A ratio:  1.27 Sunit  Tolia Electronically signed by Madonna Large Signature Date/Time: 01/14/2024/6:34:00 PM    Final    NM Bone Scan Whole Body Result Date: 01/08/2024 CLINICAL DATA:  Breast cancer.  Initial workup. EXAM: NUCLEAR MEDICINE WHOLE BODY BONE SCAN TECHNIQUE: Whole body anterior and posterior images were obtained approximately 3 hours after intravenous injection of radiopharmaceutical. RADIOPHARMACEUTICALS:  Twenty-two mCi Technetium-4m MDP IV COMPARISON:  Chest CT 01/05/2024 FINDINGS: No foci of radiotracer activity within the axillary or appendicular skeleton to localize metastatic skeletal disease. IMPRESSION: No evidence skeletal metastasis Electronically Signed   By: Jackquline Boxer M.D.   On: 01/08/2024 11:59   CT Chest W Contrast Result Date: 01/05/2024 CLINICAL DATA:  Breast cancer, invasive, stage I/II/III, initial workup new breast cancer/ back pain. * Tracking Code: BO * EXAM:  CT CHEST, ABDOMEN AND PELVIS WITHOUT CONTRAST TECHNIQUE: Multidetector CT imaging of the chest, abdomen and pelvis was performed following the standard protocol without IV contrast. RADIATION DOSE REDUCTION: This exam was performed according to the departmental dose-optimization program which includes automated exposure control, adjustment of the mA and/or kV according to patient size and/or use of iterative reconstruction technique. COMPARISON:  CT scan abdomen and pelvis from 12/19/2019. FINDINGS: CT CHEST FINDINGS Cardiovascular: Normal cardiac size. There is trace pericardial effusion as well as small amount of fluid along the periaortic recess. No aortic aneurysm. Mediastinum/Nodes: Visualized thyroid  gland appears grossly unremarkable. No solid / cystic mediastinal masses. The esophagus is nondistended precluding optimal assessment. No mediastinal or hilar lymphadenopathy by size criteria. There are few prominent right axillary lymph nodes with largest having short axis up to 6 mm. Several of the lymph nodes exhibit preserved hilar fat and favored benign. However, there are few heterogeneous lymph nodes (for example, series 2, images 38 and 52), which are indeterminate in this patient with known history of breast malignancy. Lungs/Pleura: The central tracheo-bronchial tree is patent. There are dependent changes in bilateral lungs. No mass or consolidation. No pleural effusion or pneumothorax. There are multiple scattered sub 5 mm calcified granulomas throughout bilateral lungs. No suspicious lung nodule. Musculoskeletal: There is a 2.7 x 4.2 cm non walled-off fluid collection in the right axilla with adjacent surgical staples, favored to represent postsurgical seroma. Correlate clinically for superimposed infection. The visualized soft tissues of the chest wall are otherwise grossly unremarkable. No suspicious osseous lesions. There are mild multilevel degenerative changes in the visualized spine. CT ABDOMEN  PELVIS FINDINGS Hepatobiliary: The liver is normal in size. Non-cirrhotic configuration. No suspicious mass. There are several simple cysts in the right hepatic dome with largest measuring up to 1.3 x 1.4 cm. There are several sub 5 mm ill-defined hypoattenuating foci (for example, series 2, images 13 and 14), which are too small to adequately characterize. No intrahepatic or extrahepatic bile duct dilation. No calcified gallstones. Normal gallbladder wall thickness. No pericholecystic inflammatory changes. Pancreas: Unremarkable. No pancreatic ductal dilatation or surrounding inflammatory changes. Spleen: Within normal limits. No focal lesion. Adrenals/Urinary Tract: Adrenal glands are unremarkable. No suspicious renal mass. No hydronephrosis. Bilateral extrarenal pelvis noted. No renal or ureteric calculi. Unremarkable urinary bladder. Stomach/Bowel: No disproportionate dilation of the small or large bowel loops. No evidence of abnormal bowel wall thickening or inflammatory changes. The appendix is unremarkable. Vascular/Lymphatic: No ascites or pneumoperitoneum. No abdominal or pelvic lymphadenopathy, by size criteria. No aneurysmal dilation of the major abdominal arteries. Reproductive: The uterus is unremarkable. No large adnexal mass. Other: There is a tiny fat containing umbilical hernia. The soft tissues and abdominal wall  are otherwise unremarkable. Musculoskeletal: No suspicious osseous lesions. There are mild multilevel degenerative changes in the visualized spine. IMPRESSION: 1. There is a 2.7 x 4.2 cm non walled-off fluid collection in the right axilla with adjacent surgical staples, favored to represent postsurgical seroma. Correlate clinically for superimposed infection. 2. There are few prominent right axillary lymph nodes with largest having short axis up to 6 mm. Several of the lymph nodes exhibit preserved hilar fat and favored benign. However, there are few heterogeneous lymph nodes, which are  indeterminate. Attention on follow-up examination is recommended. 3. No metastatic disease identified within the abdomen or pelvis. 4. Multiple other nonacute observations, as described above. Electronically Signed   By: Ree Molt M.D.   On: 01/05/2024 09:59   CT ABDOMEN PELVIS W CONTRAST Result Date: 01/05/2024 CLINICAL DATA:  Breast cancer, invasive, stage I/II/III, initial workup new breast cancer/ back pain. * Tracking Code: BO * EXAM: CT CHEST, ABDOMEN AND PELVIS WITHOUT CONTRAST TECHNIQUE: Multidetector CT imaging of the chest, abdomen and pelvis was performed following the standard protocol without IV contrast. RADIATION DOSE REDUCTION: This exam was performed according to the departmental dose-optimization program which includes automated exposure control, adjustment of the mA and/or kV according to patient size and/or use of iterative reconstruction technique. COMPARISON:  CT scan abdomen and pelvis from 12/19/2019. FINDINGS: CT CHEST FINDINGS Cardiovascular: Normal cardiac size. There is trace pericardial effusion as well as small amount of fluid along the periaortic recess. No aortic aneurysm. Mediastinum/Nodes: Visualized thyroid  gland appears grossly unremarkable. No solid / cystic mediastinal masses. The esophagus is nondistended precluding optimal assessment. No mediastinal or hilar lymphadenopathy by size criteria. There are few prominent right axillary lymph nodes with largest having short axis up to 6 mm. Several of the lymph nodes exhibit preserved hilar fat and favored benign. However, there are few heterogeneous lymph nodes (for example, series 2, images 38 and 52), which are indeterminate in this patient with known history of breast malignancy. Lungs/Pleura: The central tracheo-bronchial tree is patent. There are dependent changes in bilateral lungs. No mass or consolidation. No pleural effusion or pneumothorax. There are multiple scattered sub 5 mm calcified granulomas throughout  bilateral lungs. No suspicious lung nodule. Musculoskeletal: There is a 2.7 x 4.2 cm non walled-off fluid collection in the right axilla with adjacent surgical staples, favored to represent postsurgical seroma. Correlate clinically for superimposed infection. The visualized soft tissues of the chest wall are otherwise grossly unremarkable. No suspicious osseous lesions. There are mild multilevel degenerative changes in the visualized spine. CT ABDOMEN PELVIS FINDINGS Hepatobiliary: The liver is normal in size. Non-cirrhotic configuration. No suspicious mass. There are several simple cysts in the right hepatic dome with largest measuring up to 1.3 x 1.4 cm. There are several sub 5 mm ill-defined hypoattenuating foci (for example, series 2, images 13 and 14), which are too small to adequately characterize. No intrahepatic or extrahepatic bile duct dilation. No calcified gallstones. Normal gallbladder wall thickness. No pericholecystic inflammatory changes. Pancreas: Unremarkable. No pancreatic ductal dilatation or surrounding inflammatory changes. Spleen: Within normal limits. No focal lesion. Adrenals/Urinary Tract: Adrenal glands are unremarkable. No suspicious renal mass. No hydronephrosis. Bilateral extrarenal pelvis noted. No renal or ureteric calculi. Unremarkable urinary bladder. Stomach/Bowel: No disproportionate dilation of the small or large bowel loops. No evidence of abnormal bowel wall thickening or inflammatory changes. The appendix is unremarkable. Vascular/Lymphatic: No ascites or pneumoperitoneum. No abdominal or pelvic lymphadenopathy, by size criteria. No aneurysmal dilation of the major abdominal arteries. Reproductive:  The uterus is unremarkable. No large adnexal mass. Other: There is a tiny fat containing umbilical hernia. The soft tissues and abdominal wall are otherwise unremarkable. Musculoskeletal: No suspicious osseous lesions. There are mild multilevel degenerative changes in the visualized  spine. IMPRESSION: 1. There is a 2.7 x 4.2 cm non walled-off fluid collection in the right axilla with adjacent surgical staples, favored to represent postsurgical seroma. Correlate clinically for superimposed infection. 2. There are few prominent right axillary lymph nodes with largest having short axis up to 6 mm. Several of the lymph nodes exhibit preserved hilar fat and favored benign. However, there are few heterogeneous lymph nodes, which are indeterminate. Attention on follow-up examination is recommended. 3. No metastatic disease identified within the abdomen or pelvis. 4. Multiple other nonacute observations, as described above. Electronically Signed   By: Ree Molt M.D.   On: 01/05/2024 09:59    Labs:  CBC: No results for input(s): WBC, HGB, HCT, PLT in the last 8760 hours.  COAGS: No results for input(s): INR, APTT in the last 8760 hours.  BMP: No results for input(s): NA, K, CL, CO2, GLUCOSE, BUN, CALCIUM, CREATININE, GFRNONAA, GFRAA in the last 8760 hours.  Invalid input(s): CMP  LIVER FUNCTION TESTS: No results for input(s): BILITOT, AST, ALT, ALKPHOS, PROT, ALBUMIN in the last 8760 hours.  TUMOR MARKERS: No results for input(s): AFPTM, CEA, CA199, CHROMGRNA in the last 8760 hours.  Assessment and Plan:  Melinda Forbes is a 58 y.o. female with newly diagnosed right breast cancer. First found 12/09/23 with palpable mass in the right breast and measured 1.6 cm by mammogram and ultrasound. 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 Pt has established care with Dr. Odean of oncology, plan is for chemotherapy. Pt now presents to IR service for port-a-catheter placement.  Risks and benefits of image-guided Port-a-catheter placement were discussed with the patient including, but not limited to bleeding, infection, pneumothorax, or fibrin sheath development and need for  additional procedures. All of the patient's questions were answered, patient is agreeable to proceed. Consent signed and in chart.   Thank you for allowing our service to participate in Melinda Forbes 's care.  Electronically Signed: Kimble VEAR Clas, PA-C   01/27/2024, 11:59 AM      I spent a total of  15 Minutes   in face to face in clinical consultation, greater than 50% of which was counseling/coordinating care for port-a-catheter placement.

## 2024-01-27 NOTE — Discharge Instructions (Signed)
 Please call Interventional Radiology clinic [phone] with any questions or concerns.  You may remove your dressing and shower tomorrow.  After the procedure, it is common to have: Discomfort at the port insertion site. Bruising on the skin over the port. This should improve over 3-4 days  Follow these instructions at home:  Medication: Do not use Aspirin or ibuprofen products, such as Advil or Motrin, as it may increase bleeding.  You may resume your usual medications as ordered by your doctor. If your doctor prescribed antibiotics, take them as directed. Do not stop taking them just because you feel better. You need to take the full course of antibiotics.  Eating and drinking: Drink plenty of liquids to keep your urine pale yellow You can resume your regular diet as directed by your doctor   Care of the procedure site Follow instructions from your health care provider about how to take care of your port insertion site. Make sure you: After your port is placed, you will get a manufacturer's information card. The card has information about your port. Keep this card with you at all times Make sure to remember what type of port you have Take care of the port as told by your health care provider DO NOT use EMLA cream for 2 weeks after port placement -the cream will remove the surgical glue on your incision DO NOT use any lotions, creams, or ointments on incision for 2 weeks. This will remove the surgical glue on your incision Wash your hands with soap and water before and after you change your bandage (dressing). If soap and water are not available, use hand sanitizer Change your dressing as told by your health care provider Leave skin glue, or adhesive strips in place. These skin closures may need to stay in place for 2 weeks or longer Check your port insertion site every day for signs of infection. Check for: Redness, swelling, or pain Fluid or blood Warmth Pus or a bad  smell  Activity Return to your normal activities as told by your health care provider. Ask your health care provider what activities are safe for you Do not lift anything that is heavier than 10 lb (4.5 kg), or the limit that you are told, until your health care provider says that it is safe Do not take baths, swim, or use a hot tub until your health care provider approves. Take showers only. Keep all follow-up visits as told by your doctor  Contact a health care provider if: You cannot flush your port with saline as directed, or you cannot draw blood from the port You have a fever or chills You have redness, swelling, or pain around your port insertion site You have fluid or blood coming from your port insertion site Your port insertion site feels warm to the touch You have pus or a bad smell coming from the port insertion site  Get help right away if: You have chest pain or shortness of breath You have bleeding from your port that you cannot control  Moderate Conscious Sedation-Care After  This sheet gives you information about how to care for yourself after your procedure. Your health care provider may also give you more specific instructions. If you have problems or questions, contact your health care provider.  After the procedure, it is common to have: Sleepiness for several hours. Impaired judgment for several hours. Difficulty with balance. Vomiting if you eat too soon.  Follow these instructions at home:  Rest. Do not  participate in activities where you could fall or become injured. Do not drive or use machinery. Do not drink alcohol. Do not take sleeping pills or medicines that cause drowsiness. Do not make important decisions or sign legal documents. Do not take care of children on your own.  Eating and drinking Follow the diet recommended by your health care provider. Drink enough fluid to keep your urine pale yellow. If you vomit: Drink water, juice, or soup  when you can drink without vomiting. Make sure you have little or no nausea before eating solid foods.  General instructions Take over-the-counter and prescription medicines only as told by your health care provider. Have a responsible adult stay with you for the time you are told. It is important to have someone help care for you until you are awake and alert. Do not smoke. Keep all follow-up visits as told by your health care provider. This is important.  Contact a health care provider if: You are still sleepy or having trouble with balance after 24 hours. You feel light-headed. You keep feeling nauseous or you keep vomiting. You develop a rash. You have a fever. You have redness or swelling around the IV site.  Get help right away if: You have trouble breathing. You have new-onset confusion at home.  This information is not intended to replace advice given to you by your health care provider. Make sure you discuss any questions you have with your healthcare provider.

## 2024-01-28 ENCOUNTER — Inpatient Hospital Stay

## 2024-01-28 ENCOUNTER — Encounter: Payer: Self-pay | Admitting: Adult Health

## 2024-01-28 ENCOUNTER — Inpatient Hospital Stay (HOSPITAL_BASED_OUTPATIENT_CLINIC_OR_DEPARTMENT_OTHER): Admitting: Adult Health

## 2024-01-28 VITALS — BP 124/66 | HR 74 | Temp 97.7°F | Resp 16 | Wt 125.8 lb

## 2024-01-28 DIAGNOSIS — Z170 Estrogen receptor positive status [ER+]: Secondary | ICD-10-CM

## 2024-01-28 DIAGNOSIS — C50411 Malignant neoplasm of upper-outer quadrant of right female breast: Secondary | ICD-10-CM

## 2024-01-28 DIAGNOSIS — Z5111 Encounter for antineoplastic chemotherapy: Secondary | ICD-10-CM | POA: Diagnosis not present

## 2024-01-28 LAB — CBC WITH DIFFERENTIAL (CANCER CENTER ONLY)
Abs Immature Granulocytes: 0.01 K/uL (ref 0.00–0.07)
Basophils Absolute: 0 K/uL (ref 0.0–0.1)
Basophils Relative: 1 %
Eosinophils Absolute: 0.2 K/uL (ref 0.0–0.5)
Eosinophils Relative: 3 %
HCT: 38.7 % (ref 36.0–46.0)
Hemoglobin: 13.4 g/dL (ref 12.0–15.0)
Immature Granulocytes: 0 %
Lymphocytes Relative: 26 %
Lymphs Abs: 1.7 K/uL (ref 0.7–4.0)
MCH: 29.6 pg (ref 26.0–34.0)
MCHC: 34.6 g/dL (ref 30.0–36.0)
MCV: 85.4 fL (ref 80.0–100.0)
Monocytes Absolute: 0.4 K/uL (ref 0.1–1.0)
Monocytes Relative: 6 %
Neutro Abs: 4.3 K/uL (ref 1.7–7.7)
Neutrophils Relative %: 64 %
Platelet Count: 196 K/uL (ref 150–400)
RBC: 4.53 MIL/uL (ref 3.87–5.11)
RDW: 12.7 % (ref 11.5–15.5)
WBC Count: 6.7 K/uL (ref 4.0–10.5)
nRBC: 0 % (ref 0.0–0.2)

## 2024-01-28 LAB — CMP (CANCER CENTER ONLY)
ALT: 8 U/L (ref 0–44)
AST: 17 U/L (ref 15–41)
Albumin: 4.2 g/dL (ref 3.5–5.0)
Alkaline Phosphatase: 65 U/L (ref 38–126)
Anion gap: 6 (ref 5–15)
BUN: 13 mg/dL (ref 6–20)
CO2: 27 mmol/L (ref 22–32)
Calcium: 9.2 mg/dL (ref 8.9–10.3)
Chloride: 106 mmol/L (ref 98–111)
Creatinine: 0.58 mg/dL (ref 0.44–1.00)
GFR, Estimated: >60 mL/min (ref >60)
Glucose, Bld: 179 mg/dL — ABNORMAL HIGH (ref 70–99)
Potassium: 3.2 mmol/L — ABNORMAL LOW (ref 3.5–5.1)
Sodium: 139 mmol/L (ref 135–145)
Total Bilirubin: 0.5 mg/dL (ref 0.0–1.2)
Total Protein: 6.7 g/dL (ref 6.5–8.1)

## 2024-01-28 MED ORDER — SODIUM CHLORIDE 0.9 % IV SOLN
600.0000 mg/m2 | Freq: Once | INTRAVENOUS | Status: AC
  Administered 2024-01-28: 1000 mg via INTRAVENOUS
  Filled 2024-01-28: qty 50

## 2024-01-28 MED ORDER — SODIUM CHLORIDE 0.9 % IV SOLN: INTRAVENOUS | Status: DC

## 2024-01-28 MED ORDER — DOXORUBICIN HCL CHEMO IV INJECTION 2 MG/ML
60.0000 mg/m2 | Freq: Once | INTRAVENOUS | Status: AC
  Administered 2024-01-28: 94 mg via INTRAVENOUS
  Filled 2024-01-28: qty 47

## 2024-01-28 MED ORDER — APREPITANT 130 MG/18ML IV EMUL
130.0000 mg | Freq: Once | INTRAVENOUS | Status: AC
  Administered 2024-01-28: 130 mg via INTRAVENOUS
  Filled 2024-01-28: qty 18

## 2024-01-28 MED ORDER — DEXAMETHASONE SOD PHOSPHATE PF 10 MG/ML IJ SOLN
10.0000 mg | Freq: Once | INTRAMUSCULAR | Status: AC
  Administered 2024-01-28: 10 mg via INTRAVENOUS

## 2024-01-28 MED ORDER — PALONOSETRON HCL INJECTION 0.25 MG/5ML
0.2500 mg | Freq: Once | INTRAVENOUS | Status: AC
  Administered 2024-01-28: 0.25 mg via INTRAVENOUS
  Filled 2024-01-28: qty 5

## 2024-01-28 NOTE — Progress Notes (Signed)
  Cancer Center Cancer Follow up:    [name], MD [phone] [address] [place] [address]   DIAGNOSIS:  Cancer Staging  Malignant neoplasm of upper-outer quadrant of right breast in female, estrogen receptor positive (HCC) Staging form: Breast, AJCC 8th Edition - Pathologic stage from 12/29/2023: Stage IB (pT2, pN82mi(sn), cM0, G2, ER+, PR+, HER2-) - Signed by [name], PA-C on 12/29/2023 Stage prefix: Initial diagnosis Method of lymph node assessment: Sentinel lymph node biopsy Histologic grading system: 3 grade system    SUMMARY OF ONCOLOGIC HISTORY: Oncology History  Malignant neoplasm of upper-outer quadrant of right breast in female, estrogen receptor positive (HCC)  12/09/2023 Initial Diagnosis   Palpable mass in the right breast measured 1.6 cm by mammogram and ultrasound.  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, margins negative, 1/3 lymph node with micrometastatic disease, anterior margin excision: Focus of IDC grade 2 with DCIS final margins negative   12/29/2023 Cancer Staging   Staging form: Breast, AJCC 8th Edition - Pathologic stage from 12/29/2023: Stage IB (pT2, pN29mi(sn), cM0, G2, ER+, PR+, HER2-) - Signed by [name], PA-C on 12/29/2023 Stage prefix: Initial diagnosis Method of lymph node assessment: Sentinel lymph node biopsy Histologic grading system: 3 grade system   01/28/2024 -  Chemotherapy   Patient is on Treatment Plan : BREAST DOSE DENSE AC q14d / PACLitaxel  q7d       CURRENT THERAPY: adriamycin /cytoxan  cycle 1  INTERVAL HISTORY:  Discussed the use of AI scribe software for clinical note transcription with the patient, who gave verbal consent to proceed.  History of Present Illness Melinda Forbes Marcha Licklider is a 58 year old female who presents for follow-up and evaluation prior to  receiving cycle one of Adriamycin  and Cytoxan . She is accompanied by her husband, Ty.  She is preparing to start her first cycle of Adriamycin  and Cytoxan  and is concerned about managing nausea, a known side effect. She has been prescribed Compazine , which she plans to take in the evening due to its sedative effects, and Zofran , which she will start on the fourth day of treatment. She plans to manage potential constipation from Zofran  with increased water intake or a stool softener.  She has arthritis in her hips and typically takes Aleve at night for pain relief but was advised to stop before her port placement. She is on Synthroid , taken in the morning on an empty stomach.  She inquired about getting a flu shot and was advised to wait until her lab results are reviewed next week to ensure her counts are stable. She has no cough, shortness of breath, or chest pain.     Patient Active Problem List   Diagnosis Date Noted   Malignant neoplasm of upper-outer quadrant of right breast in female, estrogen receptor positive (HCC) 12/20/2023   Impacted cerumen of left ear 12/07/2023   Chronic rhinitis 01/19/2023   Other specified disorders of eustachian tube, left ear 01/19/2023   Precordial pain 06/04/2022   Pericardial effusion 08/08/2020   Atypical chest pain 08/08/2020   History of bronchitis    Elevated blood pressure reading    Chronic throat clearing    Hypokalemia 12/01/2019   Abscess 12/01/2019   Proctocolitis with abscess 11/30/2019   Pain in left hip 10/27/2018   History of recurrent UTIs 01/06/2012   Hypothyroidism  GERD (gastroesophageal reflux disease)     is allergic to codeine, morphine  sulfate, and other.  MEDICAL HISTORY: Past Medical History:  Diagnosis Date   Breast cancer (HCC) 11/2023   Right   Bursitis left hip   Chronic throat clearing    ENT eval 2008 by Dr. Ethyl showed normal vocal cords and  slight edema of the arytenoid mucosa possibly indicative of  globus type sx's and possible reflux symptoms.   Complication of anesthesia    COVID 03/04/2020   congestion loss of taste and smell x 7 days, took monoclonal antibody infusion   GERD (gastroesophageal reflux disease)    omeprazole  daily helps   Hashimoto's thyroiditis    History of bronchitis    Hypothyroidism    managed by endocrinologist ([name])   Pelvic congestion syndrome    on left side   Perforation bowel (HCC)    colon perforation 11-30-2019 hospitialized, and march 2022   Pericardial effusion    PONV (postoperative nausea and vomiting)    nausea   Wears glasses    for reading    SURGICAL HISTORY: Past Surgical History:  Procedure Laterality Date   BREAST BIOPSY Right 12/09/2023   US  RT BREAST BX W LOC DEV 1ST LESION IMG BX SPEC US  GUIDE 12/09/2023 [facility]   BREAST BIOPSY  12/21/2023   US  RT RADIOACTIVE SEED LOC 12/21/2023 [facility]   BREAST LUMPECTOMY WITH RADIOACTIVE SEED AND SENTINEL LYMPH NODE BIOPSY Right 12/23/2023   Procedure: RIGHT BREAST LUMPECTOMY WITH RADIOACTIVE SEED AND SENTINEL LYMPH NODE BIOPSY;  Surgeon: [name], MD;  Location: [facility];  Service: General;  Laterality: Right;  RIGHT BREAST RADIOACTIVE SEED LOCALIZED LUMPECTOMYSENTINEL NODE BIOPSY; Right arm restriction   CESAREAN SECTION  1988   first pregnancy.  Her second delivery was vaginal.   colonscopy  01/2020   x 3   CYST EXCISION  1999   Epidermal inclusion cyst in right groin (Dr. Mikell)   GROIN DEBRIDEMENT  1999   cyst removed   IR IMAGING GUIDED PORT INSERTION  01/27/2024   LAPAROSCOPY N/A 08/22/2020   Procedure: LAPAROSCOPY OPERATIVE, LYSIS OF ADHESIONS;  Surgeon: [name], MD;  Location: [facility];  Service: Gynecology;  Laterality: N/A;   MYRINGOTOMY WITH TUBE PLACEMENT Left 08/2022    SOCIAL HISTORY: Social History   Socioeconomic History   Marital status: Married    Spouse name: Not on file   Number of  children: 3   Years of education: Not on file   Highest education level: Not on file  Occupational History   Not on file  Tobacco Use   Smoking status: Never    Passive exposure: Past   Smokeless tobacco: Never  Vaping Use   Vaping status: Never Used  Substance and Sexual Activity   Alcohol use: No   Drug use: No   Sexual activity: Not on file  Other Topics Concern   Not on file  Social History Narrative   Divorced, engaged.     Three grown children.   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  Bachelor's degree from [place]  state.     She is originally from [place].   No T/A/Ds.   Exercise: walks a lot (1-2 times per week).   She tries to eat healthy.   Social Drivers of Corporate Investment Banker Strain: Not on file  Food Insecurity: No Food Insecurity (12/30/2023)   Hunger Vital Sign    Worried  About Running Out of Food in the Last Year: Never true    Ran Out of Food in the Last Year: Never true  Transportation Needs: No Transportation Needs (12/30/2023)   PRAPARE - Administrator, Civil Service (Medical): No    Lack of Transportation (Non-Medical): No  Physical Activity: Not on file  Stress: Not on file  Social Connections: Unknown (05/15/2022)   [facility] from [facility]   Social Network    Social Network: Not on file  Intimate Partner Violence: Not At Risk (12/30/2023)   Humiliation, Afraid, Rape, and Kick questionnaire    Fear of Current or Ex-Partner: No    Emotionally Abused: No    Physically Abused: No    Sexually Abused: No    FAMILY HISTORY: Family History  Problem Relation Age of Onset   Diabetes Mother    Breast cancer Mother        dx 5s   Heart disease Father        first MI age 63   Hypertension Father    Cancer Father        myelodysplastic metaplasia   Breast cancer Paternal Aunt        dx 3 times, in 69s initially   Lung cancer Paternal Aunt    Cervical cancer Paternal Aunt 37   Breast cancer Maternal Grandmother 35    Bone cancer Paternal Grandfather     Review of Systems  Constitutional:  Negative for appetite change, chills, fatigue, fever and unexpected weight change.  HENT:   Negative for hearing loss, lump/mass and trouble swallowing.   Eyes:  Negative for eye problems and icterus.  Respiratory:  Negative for chest tightness, cough and shortness of breath.   Cardiovascular:  Negative for chest pain, leg swelling and palpitations.  Gastrointestinal:  Negative for abdominal distention, abdominal pain, constipation, diarrhea, nausea and vomiting.  Endocrine: Negative for hot flashes.  Genitourinary:  Negative for difficulty urinating.   Musculoskeletal:  Negative for arthralgias.  Skin:  Negative for itching and rash.  Neurological:  Negative for dizziness, extremity weakness, headaches and numbness.  Hematological:  Negative for adenopathy. Does not bruise/bleed easily.  Psychiatric/Behavioral:  Negative for depression. The patient is not nervous/anxious.       PHYSICAL EXAMINATION    Vitals:   01/28/24 1148  BP: 124/66  Pulse: 74  Resp: 16  Temp: 97.7 F (36.5 C)  SpO2: 96%    Physical Exam Constitutional:      General: She is not in acute distress.    Appearance: Normal appearance. She is not toxic-appearing.  HENT:     Head: Normocephalic and atraumatic.     Mouth/Throat:     Mouth: Mucous membranes are moist.     Pharynx: Oropharynx is clear. No oropharyngeal exudate or posterior oropharyngeal erythema.  Eyes:     General: No scleral icterus. Cardiovascular:     Rate and Rhythm: Normal rate and regular rhythm.     Pulses: Normal pulses.     Heart sounds: Normal heart sounds.  Pulmonary:     Effort: Pulmonary effort is normal.     Breath sounds: Normal breath sounds.  Abdominal:     General: Abdomen is flat. Bowel sounds are normal. There is no distension.     Palpations: Abdomen is soft.     Tenderness: There is no abdominal tenderness.  Musculoskeletal:         General: No swelling.     Cervical back: Neck supple.  Lymphadenopathy:     Cervical: No cervical adenopathy.  Skin:    General: Skin is warm and dry.     Findings: No rash.  Neurological:     General: No focal deficit present.     Mental Status: She is alert.  Psychiatric:        Mood and Affect: Mood normal.        Behavior: Behavior normal.     LABORATORY DATA:  CBC    Component Value Date/Time   WBC 6.7 01/28/2024 1120   WBC 6.0 08/19/2020 0851   RBC 4.53 01/28/2024 1120   HGB 13.4 01/28/2024 1120   HCT 38.7 01/28/2024 1120   PLT 196 01/28/2024 1120   MCV 85.4 01/28/2024 1120   MCH 29.6 01/28/2024 1120   MCHC 34.6 01/28/2024 1120   RDW 12.7 01/28/2024 1120   LYMPHSABS 1.7 01/28/2024 1120   MONOABS 0.4 01/28/2024 1120   EOSABS 0.2 01/28/2024 1120   BASOSABS 0.0 01/28/2024 1120    CMP     Component Value Date/Time   NA 139 01/28/2024 1120   K 3.2 (L) 01/28/2024 1120   CL 106 01/28/2024 1120   CO2 27 01/28/2024 1120   GLUCOSE 179 (H) 01/28/2024 1120   BUN 13 01/28/2024 1120   CREATININE 0.58 01/28/2024 1120   CALCIUM 9.2 01/28/2024 1120   PROT 6.7 01/28/2024 1120   ALBUMIN 4.2 01/28/2024 1120   AST 17 01/28/2024 1120   ALT 8 01/28/2024 1120   ALKPHOS 65 01/28/2024 1120   BILITOT 0.5 01/28/2024 1120   GFRNONAA >60 01/28/2024 1120   GFRAA >60 12/03/2019 0300     ASSESSMENT and THERAPY PLAN:   Malignant neoplasm of upper-outer quadrant of right breast in female, estrogen receptor positive (HCC) 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, anterior margin excision: Focus of IDC grade 2 with DCIS final margins negative  Oncotype DX: Recurrence score 31 (18% risk of recurrence)  01/05/2024: CT CAP and bone scan: Simple cysts in the liver largest 1.4 cm, several sub-5 mm ill-defined foci too small to characterize, few prominent right axillary lymph nodes, seroma  right axilla 4.2 cm, ER 100%, PR 10%, HER2 1+ negative, Ki67 20%  Recommendation: Adjuvant chemotherapy with dose dense Adriamycin  and Cytoxan  x 4 followed by Taxol  x 12 Adjuvant radiation therapy Antiestrogen therapy  Current treatment: Adjuvant chemotherapy with Adriamycin /Cytoxan   Assessment and Plan Assessment & Plan Malignant neoplasm of upper-outer quadrant of right breast Preparing for cycle one of Adriamycin  and Cytoxan . Discussed side effect management, especially nausea. Advised on temperature monitoring before NSAID use. Discussed flu vaccination timing. - Administer cycle one of Adriamycin  and Cytoxan . - Prescribe Compazine  and Zofran  with instructions. - Advise temperature monitoring before NSAID use. - Discuss flu vaccination timing with Doctor Gudena at next appt  Osteoarthritis of hip Significant discomfort from osteoarthritis. Safe to resume Aleve post-port placement. - Resume Aleve, check temperature before use.  Hypothyroidism On Synthroid . Reminded about proper administration for absorption. - Continue Synthroid  on an empty stomach.     All questions were answered. The patient knows to call the clinic with any problems, questions or concerns. We can certainly see the patient much sooner if necessary.  Total encounter time:30 minutes*in face-to-face visit time, chart review, lab review, care coordination, order entry, and documentation of the encounter time.    Morna Kendall, NP 01/31/24 3:16 PM Medical Oncology and Hematology [facility]  [address] [place], [place] [address] Tel. [phone]    Fax. [phone]  *Total Encounter Time as defined by the Centers for Medicare and Medicaid Services includes, in addition to the face-to-face time of a patient visit (documented in the note above) non-face-to-face time: obtaining and reviewing outside history, ordering and reviewing medications, tests or procedures, care coordination  (communications with other health care professionals or caregivers) and documentation in the medical record.

## 2024-01-28 NOTE — Patient Instructions (Signed)
 [facility]  Discharge Instructions: Thank you for choosing [facility] to provide your oncology and hematology care.   If you have a lab appointment with the Cancer Center, please go directly to the Cancer Center and check in at the registration area.   Wear comfortable clothing and clothing appropriate for easy access to any Portacath or PICC line.   We strive to give you quality time with your provider. You may need to reschedule your appointment if you arrive late (15 or more minutes).  Arriving late affects you and other patients whose appointments are after yours.  Also, if you miss three or more appointments without notifying the office, you may be dismissed from the clinic at the provider's discretion.      For prescription refill requests, have your pharmacy contact our office and allow 72 hours for refills to be completed.    Today you received the following chemotherapy and/or immunotherapy agents Doxorubicin, Cytoxan      To help prevent nausea and vomiting after your treatment, we encourage you to take your nausea medication as directed.  BELOW ARE SYMPTOMS THAT SHOULD BE REPORTED IMMEDIATELY: *FEVER GREATER THAN 100.4 F (38 C) OR HIGHER *CHILLS OR SWEATING *NAUSEA AND VOMITING THAT IS NOT CONTROLLED WITH YOUR NAUSEA MEDICATION *UNUSUAL SHORTNESS OF BREATH *UNUSUAL BRUISING OR BLEEDING *URINARY PROBLEMS (pain or burning when urinating, or frequent urination) *BOWEL PROBLEMS (unusual diarrhea, constipation, pain near the anus) TENDERNESS IN MOUTH AND THROAT WITH OR WITHOUT PRESENCE OF ULCERS (sore throat, sores in mouth, or a toothache) UNUSUAL RASH, SWELLING OR PAIN  UNUSUAL VAGINAL DISCHARGE OR ITCHING   Items with * indicate a potential emergency and should be followed up as soon as possible or go to the Emergency Department if any problems should occur.  Please show the CHEMOTHERAPY ALERT CARD or  IMMUNOTHERAPY ALERT CARD at check-in to the Emergency Department and triage nurse.  Should you have questions after your visit or need to cancel or reschedule your appointment, please contact [facility]  Dept: [phone]  and follow the prompts.  Office hours are 8:00 a.m. to 4:30 p.m. Monday - Friday. Please note that voicemails left after 4:00 p.m. may not be returned until the following business day.  We are closed weekends and major holidays. You have access to a nurse at all times for urgent questions. Please call the main number to the clinic Dept: [phone] and follow the prompts.   For any non-urgent questions, you may also contact your provider using MyChart. We now offer e-Visits for anyone 82 and older to request care online for non-urgent symptoms. For details visit [email or web address].   Also download the MyChart app! Go to the app store, search "MyChart", open the app, select [place], and log in with your MyChart username and password.  Doxorubicin Injection What is this medication? DOXORUBICIN (dox oh ROO bi sin) treats some types of cancer. It works by slowing down the growth of cancer cells. This medicine may be used for other purposes; ask your health care provider or pharmacist if you have questions. COMMON BRAND NAME(S): Adriamycin, Adriamycin PFS, Adriamycin RDF, Rubex What should I tell my care team before I take this medication? They need to know if you have any of these conditions: Heart disease History of low blood cell levels caused by a medication Liver  disease Recent or ongoing radiation An unusual or allergic reaction to doxorubicin, other medications, foods, dyes, or preservatives If you or your partner are pregnant or trying to get pregnant Breast-feeding How should I use this medication? This medication is injected into a vein. It is given by your care team in a hospital or clinic  setting. Talk to your care team about the use of this medication in children. Special care may be needed. Overdosage: If you think you have taken too much of this medicine contact a poison control center or emergency room at once. NOTE: This medicine is only for you. Do not share this medicine with others. What if I miss a dose? Keep appointments for follow-up doses. It is important not to miss your dose. Call your care team if you are unable to keep an appointment. What may interact with this medication? 6-mercaptopurine Paclitaxel Phenytoin St. John's wort Trastuzumab Verapamil This list may not describe all possible interactions. Give your health care provider a list of all the medicines, herbs, non-prescription drugs, or dietary supplements you use. Also tell them if you smoke, drink alcohol, or use illegal drugs. Some items may interact with your medicine. What should I watch for while using this medication? Your condition will be monitored carefully while you are receiving this medication. You may need blood work while taking this medication. This medication may make you feel generally unwell. This is not uncommon as chemotherapy can affect healthy cells as well as cancer cells. Report any side effects. Continue your course of treatment even though you feel ill unless your care team tells you to stop. There is a maximum amount of this medication you should receive throughout your life. The amount depends on the medical condition being treated and your overall health. Your care team will watch how much of this medication you receive. Tell your care team if you have taken this medication before. Your urine may turn red for a few days after your dose. This is not blood. If your urine is dark or brown, call your care team. In some cases, you may be given additional medications to help with side effects. Follow all directions for their use. This medication may increase your risk of getting an  infection. Call your care team for advice if you get a fever, chills, sore throat, or other symptoms of a cold or flu. Do not treat yourself. Try to avoid being around people who are sick. This medication may increase your risk to bruise or bleed. Call your care team if you notice any unusual bleeding. Talk to your care team about your risk of cancer. You may be more at risk for certain types of cancers if you take this medication. Talk to your care team if you or your partner may be pregnant. Serious birth defects can occur if you take this medication during pregnancy and for 6 months after the last dose. Contraception is recommended while taking this medication and for 6 months after the last dose. Your care team can help you find the option that works for you. If your partner can get pregnant, use a condom while taking this medication and for 6 months after the last dose. Do not breastfeed while taking this medication. This medication may cause infertility. Talk to your care team if you are concerned about your fertility. What side effects may I notice from receiving this medication? Side effects that you should report to your care team as soon as possible: Allergic reactions--skin rash,  itching, hives, swelling of the face, lips, tongue, or throat Heart failure--shortness of breath, swelling of the ankles, feet, or hands, sudden weight gain, unusual weakness or fatigue Heart rhythm changes--fast or irregular heartbeat, dizziness, feeling faint or lightheaded, chest pain, trouble breathing Infection--fever, chills, cough, sore throat, wounds that don't heal, pain or trouble when passing urine, general feeling of discomfort or being unwell Low red blood cell level--unusual weakness or fatigue, dizziness, headache, trouble breathing Painful swelling, warmth, or redness of the skin, blisters or sores at the infusion site Unusual bruising or bleeding Side effects that usually do not require medical  attention (report to your care team if they continue or are bothersome): Diarrhea Hair loss Nausea Pain, redness, or swelling with sores inside the mouth or throat Red urine This list may not describe all possible side effects. Call your doctor for medical advice about side effects. You may report side effects to FDA at 1-800-FDA-1088. Where should I keep my medication? This medication is given in a hospital or clinic. It will not be stored at home. NOTE: This sheet is a summary. It may not cover all possible information. If you have questions about this medicine, talk to your doctor, pharmacist, or health care provider.  2024 Elsevier/Gold Standard (2022-07-02 00:00:00)  Cyclophosphamide Injection What is this medication? CYCLOPHOSPHAMIDE (sye kloe FOSS fa mide) treats some types of cancer. It works by slowing down the growth of cancer cells. This medicine may be used for other purposes; ask your health care provider or pharmacist if you have questions. COMMON BRAND NAME(S): Cyclophosphamide, Cytoxan, Neosar What should I tell my care team before I take this medication? They need to know if you have any of these conditions: Heart disease Irregular heartbeat or rhythm Infection Kidney problems Liver disease Low blood cell levels (white cells, platelets, or red blood cells) Lung disease Previous radiation Trouble passing urine An unusual or allergic reaction to cyclophosphamide, other medications, foods, dyes, or preservatives Pregnant or trying to get pregnant Breast-feeding How should I use this medication? This medication is injected into a vein. It is given by your care team in a hospital or clinic setting. Talk to your care team about the use of this medication in children. Special care may be needed. Overdosage: If you think you have taken too much of this medicine contact a poison control center or emergency room at once. NOTE: This medicine is only for you. Do not share  this medicine with others. What if I miss a dose? Keep appointments for follow-up doses. It is important not to miss your dose. Call your care team if you are unable to keep an appointment. What may interact with this medication? Amphotericin B Amiodarone Azathioprine Certain antivirals for HIV or hepatitis Certain medications for blood pressure, such as enalapril, lisinopril, quinapril Cyclosporine Diuretics Etanercept Indomethacin Medications that relax muscles Metronidazole Natalizumab Tamoxifen Warfarin This list may not describe all possible interactions. Give your health care provider a list of all the medicines, herbs, non-prescription drugs, or dietary supplements you use. Also tell them if you smoke, drink alcohol, or use illegal drugs. Some items may interact with your medicine. What should I watch for while using this medication? This medication may make you feel generally unwell. This is not uncommon as chemotherapy can affect healthy cells as well as cancer cells. Report any side effects. Continue your course of treatment even though you feel ill unless your care team tells you to stop. You may need blood work while you  are taking this medication. This medication may increase your risk of getting an infection. Call your care team for advice if you get a fever, chills, sore throat, or other symptoms of a cold or flu. Do not treat yourself. Try to avoid being around people who are sick. Avoid taking medications that contain aspirin, acetaminophen, ibuprofen, naproxen, or ketoprofen unless instructed by your care team. These medications may hide a fever. Be careful brushing or flossing your teeth or using a toothpick because you may get an infection or bleed more easily. If you have any dental work done, tell your dentist you are receiving this medication. Drink water or other fluids as directed. Urinate often, even at night. Some products may contain alcohol. Ask your care team  if this medication contains alcohol. Be sure to tell all care teams you are taking this medicine. Certain medicines, like metronidazole and disulfiram, can cause an unpleasant reaction when taken with alcohol. The reaction includes flushing, headache, nausea, vomiting, sweating, and increased thirst. The reaction can last from 30 minutes to several hours. Talk to your care team if you wish to become pregnant or think you might be pregnant. This medication can cause serious birth defects if taken during pregnancy and for 1 year after the last dose. A negative pregnancy test is required before starting this medication. A reliable form of contraception is recommended while taking this medication and for 1 year after the last dose. Talk to your care team about reliable forms of contraception. Do not father a child while taking this medication and for 4 months after the last dose. Use a condom during this time period. Do not breast-feed while taking this medication or for 1 week after the last dose. This medication may cause infertility. Talk to your care team if you are concerned about your fertility. Talk to your care team about your risk of cancer. You may be more at risk for certain types of cancer if you take this medication. What side effects may I notice from receiving this medication? Side effects that you should report to your care team as soon as possible: Allergic reactions--skin rash, itching, hives, swelling of the face, lips, tongue, or throat Dry cough, shortness of breath or trouble breathing Heart failure--shortness of breath, swelling of the ankles, feet, or hands, sudden weight gain, unusual weakness or fatigue Heart muscle inflammation--unusual weakness or fatigue, shortness of breath, chest pain, fast or irregular heartbeat, dizziness, swelling of the ankles, feet, or hands Heart rhythm changes--fast or irregular heartbeat, dizziness, feeling faint or lightheaded, chest pain, trouble  breathing Infection--fever, chills, cough, sore throat, wounds that don't heal, pain or trouble when passing urine, general feeling of discomfort or being unwell Kidney injury--decrease in the amount of urine, swelling of the ankles, hands, or feet Liver injury--right upper belly pain, loss of appetite, nausea, light-colored stool, dark yellow or brown urine, yellowing skin or eyes, unusual weakness or fatigue Low red blood cell level--unusual weakness or fatigue, dizziness, headache, trouble breathing Low sodium level--muscle weakness, fatigue, dizziness, headache, confusion Red or dark brown urine Unusual bruising or bleeding Side effects that usually do not require medical attention (report to your care team if they continue or are bothersome): Hair loss Irregular menstrual cycles or spotting Loss of appetite Nausea Pain, redness, or swelling with sores inside the mouth or throat Vomiting This list may not describe all possible side effects. Call your doctor for medical advice about side effects. You may report side effects to FDA at  1-800-FDA-1088. Where should I keep my medication? This medication is given in a hospital or clinic. It will not be stored at home. NOTE: This sheet is a summary. It may not cover all possible information. If you have questions about this medicine, talk to your doctor, pharmacist, or health care provider.  2024 Elsevier/Gold Standard (2021-08-15 00:00:00)

## 2024-01-31 ENCOUNTER — Inpatient Hospital Stay

## 2024-01-31 ENCOUNTER — Telehealth: Payer: Self-pay

## 2024-01-31 ENCOUNTER — Encounter: Payer: Self-pay | Admitting: Hematology and Oncology

## 2024-01-31 ENCOUNTER — Encounter: Payer: Self-pay | Admitting: Licensed Clinical Social Worker

## 2024-01-31 VITALS — BP 140/81 | HR 62 | Resp 17

## 2024-01-31 DIAGNOSIS — C50411 Malignant neoplasm of upper-outer quadrant of right female breast: Secondary | ICD-10-CM

## 2024-01-31 DIAGNOSIS — Z5111 Encounter for antineoplastic chemotherapy: Secondary | ICD-10-CM | POA: Diagnosis not present

## 2024-01-31 MED ORDER — PEGFILGRASTIM-CBQV 6 MG/0.6ML ~~LOC~~ SOSY
6.0000 mg | PREFILLED_SYRINGE | Freq: Once | SUBCUTANEOUS | Status: AC
  Administered 2024-01-31: 6 mg via SUBCUTANEOUS
  Filled 2024-01-31: qty 0.6

## 2024-01-31 NOTE — Progress Notes (Addendum)
 Okay to proceed with Udenyca today. Next cycle being rescheduled from 10/28 to 11/4 (patient prefers Tuesday appointments).  Harlene Nasuti, PharmD Oncology Infusion Pharmacist 01/31/2024 1:00 PM

## 2024-01-31 NOTE — Assessment & Plan Note (Signed)
 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, anterior margin excision: Focus of IDC grade 2 with DCIS final margins negative  Oncotype DX: Recurrence score 31 (18% risk of recurrence)  01/05/2024: CT CAP and bone scan: Simple cysts in the liver largest 1.4 cm, several sub-5 mm ill-defined foci too small to characterize, few prominent right axillary lymph nodes, seroma right axilla 4.2 cm, ER 100%, PR 10%, HER2 1+ negative, Ki67 20%  Recommendation: Adjuvant chemotherapy with dose dense Adriamycin and Cytoxan x 4 followed by Taxol x 12 Adjuvant radiation therapy Antiestrogen therapy  Current treatment: Adjuvant chemotherapy with Adriamycin/Cytoxan

## 2024-01-31 NOTE — Telephone Encounter (Signed)
 S/w patient at length about updates in her treatment plan and interventions for nausea and constipation. Patient verbalized an understanding of the information and recommendations. Message sent to Dr. Gudena regarding possible need for additional prn antiemetic as patient is still having ongoing nausea with home antiemetics.  Patient reports only one episode of emesis at this time and reports that it was due to accidentally choking on her food. At this time, she feels that she is eating as well as can be expected, but still experiencing persistent nausea.  All questions answered during call. Patient knows to call back with any additional questions or concerns.

## 2024-01-31 NOTE — Telephone Encounter (Signed)
 I contacted Ms. Kinser to discuss her genetic testing results. 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 Patient would like to review results and will call back at a later time if she has questions.   The test report has been scanned into EPIC and is located under the Molecular Pathology section of the Results Review tab.  A portion of the result report is included below for reference.      Dena Cary, MS, [facility] Genetic Counselor [email or web address] Phone: [phone]

## 2024-02-01 ENCOUNTER — Other Ambulatory Visit: Payer: Self-pay | Admitting: *Deleted

## 2024-02-01 MED ORDER — PROMETHAZINE HCL 12.5 MG PO TABS: 12.5000 mg | ORAL_TABLET | Freq: Four times a day (QID) | ORAL | 1 refills | Status: DC | PRN

## 2024-02-01 NOTE — Progress Notes (Signed)
 Per MD request, prescription for Phenergan 12.5 mg p.o tablet for ongoing nausea despite compazine  and Zofran .  Prescription sent to pharmacy on file, pt educated and verbalized understanding.

## 2024-02-03 ENCOUNTER — Encounter: Payer: Self-pay | Admitting: Licensed Clinical Social Worker

## 2024-02-03 DIAGNOSIS — Z1379 Encounter for other screening for genetic and chromosomal anomalies: Secondary | ICD-10-CM | POA: Insufficient documentation

## 2024-02-03 DIAGNOSIS — Z1589 Genetic susceptibility to other disease: Secondary | ICD-10-CM | POA: Insufficient documentation

## 2024-02-08 ENCOUNTER — Ambulatory Visit

## 2024-02-08 ENCOUNTER — Ambulatory Visit: Admitting: Hematology and Oncology

## 2024-02-08 ENCOUNTER — Other Ambulatory Visit

## 2024-02-09 ENCOUNTER — Other Ambulatory Visit (INDEPENDENT_AMBULATORY_CARE_PROVIDER_SITE_OTHER): Payer: Self-pay | Admitting: Otolaryngology

## 2024-02-09 MED ORDER — FLUTICASONE PROPIONATE 50 MCG/ACT NA SUSP: 2.0000 | Freq: Every day | NASAL | 10 refills | Status: DC

## 2024-02-10 ENCOUNTER — Ambulatory Visit

## 2024-02-11 ENCOUNTER — Other Ambulatory Visit

## 2024-02-11 ENCOUNTER — Ambulatory Visit: Admitting: Adult Health

## 2024-02-11 ENCOUNTER — Ambulatory Visit

## 2024-02-15 ENCOUNTER — Inpatient Hospital Stay

## 2024-02-15 ENCOUNTER — Inpatient Hospital Stay: Attending: Hematology and Oncology

## 2024-02-15 ENCOUNTER — Encounter: Payer: Self-pay | Admitting: Hematology and Oncology

## 2024-02-15 ENCOUNTER — Inpatient Hospital Stay (HOSPITAL_BASED_OUTPATIENT_CLINIC_OR_DEPARTMENT_OTHER): Admitting: Hematology and Oncology

## 2024-02-15 VITALS — BP 115/78 | HR 71 | Temp 97.4°F | Resp 18 | Ht 63.0 in | Wt 125.6 lb

## 2024-02-15 DIAGNOSIS — Z170 Estrogen receptor positive status [ER+]: Secondary | ICD-10-CM

## 2024-02-15 DIAGNOSIS — Z79632 Long term (current) use of antitumor antibiotic: Secondary | ICD-10-CM | POA: Insufficient documentation

## 2024-02-15 DIAGNOSIS — Z79630 Long term (current) use of alkylating agent: Secondary | ICD-10-CM | POA: Diagnosis not present

## 2024-02-15 DIAGNOSIS — C50411 Malignant neoplasm of upper-outer quadrant of right female breast: Secondary | ICD-10-CM

## 2024-02-15 DIAGNOSIS — Z5189 Encounter for other specified aftercare: Secondary | ICD-10-CM | POA: Insufficient documentation

## 2024-02-15 DIAGNOSIS — Z5111 Encounter for antineoplastic chemotherapy: Secondary | ICD-10-CM | POA: Insufficient documentation

## 2024-02-15 LAB — CBC WITH DIFFERENTIAL (CANCER CENTER ONLY)
Abs Immature Granulocytes: 1.64 K/uL — ABNORMAL HIGH (ref 0.00–0.07)
Basophils Absolute: 0.2 K/uL — ABNORMAL HIGH (ref 0.0–0.1)
Basophils Relative: 1 %
Eosinophils Absolute: 0.1 K/uL (ref 0.0–0.5)
Eosinophils Relative: 0 %
HCT: 36.9 % (ref 36.0–46.0)
Hemoglobin: 12.6 g/dL (ref 12.0–15.0)
Immature Granulocytes: 10 %
Lymphocytes Relative: 12 %
Lymphs Abs: 2.1 K/uL (ref 0.7–4.0)
MCH: 29.6 pg (ref 26.0–34.0)
MCHC: 34.1 g/dL (ref 30.0–36.0)
MCV: 86.6 fL (ref 80.0–100.0)
Monocytes Absolute: 1.2 K/uL — ABNORMAL HIGH (ref 0.1–1.0)
Monocytes Relative: 7 %
Neutro Abs: 12.1 K/uL — ABNORMAL HIGH (ref 1.7–7.7)
Neutrophils Relative %: 70 %
Platelet Count: 201 K/uL (ref 150–400)
RBC: 4.26 MIL/uL (ref 3.87–5.11)
RDW: 13.4 % (ref 11.5–15.5)
WBC Count: 17.3 K/uL — ABNORMAL HIGH (ref 4.0–10.5)
nRBC: 0.2 % (ref 0.0–0.2)

## 2024-02-15 LAB — CMP (CANCER CENTER ONLY)
ALT: 26 U/L (ref 0–44)
AST: 32 U/L (ref 15–41)
Albumin: 4.4 g/dL (ref 3.5–5.0)
Alkaline Phosphatase: 108 U/L (ref 38–126)
Anion gap: 7 (ref 5–15)
BUN: 16 mg/dL (ref 6–20)
CO2: 28 mmol/L (ref 22–32)
Calcium: 9.3 mg/dL (ref 8.9–10.3)
Chloride: 105 mmol/L (ref 98–111)
Creatinine: 0.61 mg/dL (ref 0.44–1.00)
GFR, Estimated: >60 mL/min (ref >60)
Glucose, Bld: 117 mg/dL — ABNORMAL HIGH (ref 70–99)
Potassium: 3.6 mmol/L (ref 3.5–5.1)
Sodium: 140 mmol/L (ref 135–145)
Total Bilirubin: 0.3 mg/dL (ref 0.0–1.2)
Total Protein: 6.8 g/dL (ref 6.5–8.1)

## 2024-02-15 MED ORDER — SODIUM CHLORIDE 0.9 % IV SOLN: INTRAVENOUS | Status: DC

## 2024-02-15 MED ORDER — DEXAMETHASONE SOD PHOSPHATE PF 10 MG/ML IJ SOLN
10.0000 mg | Freq: Once | INTRAMUSCULAR | Status: AC
  Administered 2024-02-15: 10 mg via INTRAVENOUS

## 2024-02-15 MED ORDER — APREPITANT 130 MG/18ML IV EMUL
130.0000 mg | Freq: Once | INTRAVENOUS | Status: AC
  Administered 2024-02-15: 130 mg via INTRAVENOUS
  Filled 2024-02-15: qty 18

## 2024-02-15 MED ORDER — SODIUM CHLORIDE 0.9 % IV SOLN
500.0000 mg/m2 | Freq: Once | INTRAVENOUS | Status: AC
  Administered 2024-02-15: 800 mg via INTRAVENOUS
  Filled 2024-02-15: qty 40

## 2024-02-15 MED ORDER — PALONOSETRON HCL INJECTION 0.25 MG/5ML
0.2500 mg | Freq: Once | INTRAVENOUS | Status: AC
  Administered 2024-02-15: 0.25 mg via INTRAVENOUS
  Filled 2024-02-15: qty 5

## 2024-02-15 MED ORDER — DOXORUBICIN HCL CHEMO IV INJECTION 2 MG/ML
50.0000 mg/m2 | Freq: Once | INTRAVENOUS | Status: AC
  Administered 2024-02-15: 80 mg via INTRAVENOUS
  Filled 2024-02-15: qty 40

## 2024-02-15 NOTE — Progress Notes (Addendum)
 Patient prefers to receive Dex via IVPB. Future Dex premedication orders changed to IVPB per patient request.  Patient received her flu shot at [facility] on 01/26/24.  Bridgett Leach [place], [place], BCPS, BCOP 02/15/2024 10:45 AM

## 2024-02-15 NOTE — Research (Signed)
 [id number], ICE COMPRESS: RANDOMIZED TRIAL OF LIMB CRYOCOMPRESSION VERSUS CONTINUOUS COMPRESSION VERSUS LOW CYCLIC COMPRESSION FOR THE PREVENTION  OF TAXANE-INDUCED PERIPHERAL NEUROPATHY Patient Melinda Forbes was identified by Dr. Gudna as a potential candidate for the above listed study.  This Clinical Research Nurse met with Melinda Forbes, [id number], on 02/15/24 in a manner and location that ensures patient privacy to discuss participation in the above listed research study.  Patient is Accompanied by her husband.  A copy of the informed consent document and separate HIPAA Authorization was provided to the patient.  Patient reads, speaks, and understands English.   Patient was provided with the business card of this Nurse and encouraged to contact the research team with any questions.  Approximately 20 minutes were spent with the patient reviewing the informed consent documents.  Patient was provided the option of taking informed consent documents home to review and was encouraged to review at their convenience with their support network, including other care providers. Patient took the consent documents home to review. Research Rn discussed study with patient and her husband. Pt was interested in participating in this research study. RN answered pt and family questions about study. Research RN will follow up with pt at next infusion appointment to ensure they have no further questions after reviewing consent. Pt and her husband were thanked for their time in discussing this study today.  Delon Pinal BSN RN Clinical Research Nurse [facility] Direct Dial: [phone] 02/15/2024  12:37 PM

## 2024-02-15 NOTE — Patient Instructions (Addendum)
 [facility]  Discharge Instructions: Thank you for choosing [facility] to provide your oncology and hematology care.   If you have a lab appointment with the Cancer Center, please go directly to the Cancer Center and check in at the registration area.   Wear comfortable clothing and clothing appropriate for easy access to any Portacath or PICC line.   We strive to give you quality time with your provider. You may need to reschedule your appointment if you arrive late (15 or more minutes).  Arriving late affects you and other patients whose appointments are after yours.  Also, if you miss three or more appointments without notifying the office, you may be dismissed from the clinic at the provider's discretion.      For prescription refill requests, have your pharmacy contact our office and allow 72 hours for refills to be completed.    Today you received the following chemotherapy and/or immunotherapy agents: Doxorubicin, Cyclophosphamide      To help prevent nausea and vomiting after your treatment, we encourage you to take your nausea medication as directed.  BELOW ARE SYMPTOMS THAT SHOULD BE REPORTED IMMEDIATELY: *FEVER GREATER THAN 100.4 F (38 C) OR HIGHER *CHILLS OR SWEATING *NAUSEA AND VOMITING THAT IS NOT CONTROLLED WITH YOUR NAUSEA MEDICATION *UNUSUAL SHORTNESS OF BREATH *UNUSUAL BRUISING OR BLEEDING *URINARY PROBLEMS (pain or burning when urinating, or frequent urination) *BOWEL PROBLEMS (unusual diarrhea, constipation, pain near the anus) TENDERNESS IN MOUTH AND THROAT WITH OR WITHOUT PRESENCE OF ULCERS (sore throat, sores in mouth, or a toothache) UNUSUAL RASH, SWELLING OR PAIN  UNUSUAL VAGINAL DISCHARGE OR ITCHING   Items with * indicate a potential emergency and should be followed up as soon as possible or go to the Emergency Department if any problems should occur.  Please show the CHEMOTHERAPY ALERT  CARD or IMMUNOTHERAPY ALERT CARD at check-in to the Emergency Department and triage nurse.  Should you have questions after your visit or need to cancel or reschedule your appointment, please contact [facility]  Dept: [phone]  and follow the prompts.  Office hours are 8:00 a.m. to 4:30 p.m. Monday - Friday. Please note that voicemails left after 4:00 p.m. may not be returned until the following business day.  We are closed weekends and major holidays. You have access to a nurse at all times for urgent questions. Please call the main number to the clinic Dept: [phone] and follow the prompts.   For any non-urgent questions, you may also contact your provider using MyChart. We now offer e-Visits for anyone 80 and older to request care online for non-urgent symptoms. For details visit [email or web address].   Also download the MyChart app! Go to the app store, search "MyChart", open the app, select [place], and log in with your MyChart username and password.

## 2024-02-15 NOTE — Assessment & Plan Note (Signed)
 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, anterior margin excision: Focus of IDC grade 2 with DCIS final margins negative  Oncotype DX: Recurrence score 31 (18% risk of recurrence)   01/05/2024: CT CAP and bone scan: Simple cysts in the liver largest 1.4 cm, several sub-5 mm ill-defined foci too small to characterize, few prominent right axillary lymph nodes, seroma right axilla 4.2 cm, ER 100%, PR 10%, HER2 1+ negative, Ki67 20%   Treatment plan: Adjuvant chemotherapy with dose dense Adriamycin and Cytoxan x 4 followed by Taxol x 12 Adjuvant radiation therapy Antiestrogen therapy ----------------------------------------------------------------------------------------------------------------------------------------- Current treatment: Cycle 2 dose dense Adriamycin and Cytoxan Chemo toxicities:   Return to clinic in 2 weeks for cycle 2

## 2024-02-15 NOTE — Progress Notes (Signed)
 Patient Care Team: [name], MD as PCP - General (Family Medicine) [name], MD as Consulting Physician (Endocrinology) [name], MD as Consulting Physician (Plastic Surgery) [name], MD as Consulting Physician (General Surgery) [name], RN as Oncology Nurse Navigator [name], Devere HERO, RN as Oncology Nurse Navigator Odean Potts, MD as Consulting Physician (Hematology and Oncology) [name], MD as Consulting Physician (General Surgery) [name], MD as Consulting Physician (Radiation Oncology)  DIAGNOSIS:  Encounter Diagnosis  Name Primary?   Malignant neoplasm of upper-outer quadrant of right breast in female, estrogen receptor positive (HCC) Yes    SUMMARY OF ONCOLOGIC HISTORY: Oncology History  Malignant neoplasm of upper-outer quadrant of right breast in female, estrogen receptor positive (HCC)  12/09/2023 Initial Diagnosis   Palpable mass in the right breast measured 1.6 cm by mammogram and ultrasound.  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, margins negative, 1/3 lymph node with micrometastatic disease, anterior margin excision: Focus of IDC grade 2 with DCIS final margins negative   12/29/2023 Cancer Staging   Staging form: Breast, AJCC 8th Edition - Pathologic stage from 12/29/2023: Stage IB (pT2, pN52mi(sn), cM0, G2, ER+, PR+, HER2-) - Signed by [name], PA-C on 12/29/2023 Stage prefix: Initial diagnosis Method of lymph node assessment: Sentinel lymph node biopsy Histologic grading system: 3 grade system   01/25/2024 Genetic Testing   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 The report date is 01/25/2024.  The CancerNext-Expanded gene panel offered by [facility] and includes sequencing, rearrangement, and RNA analysis for the  following 77 genes: AIP, ALK, APC, ATM, AXIN2, BAP1, BARD1, BMPR1A, BRCA1, BRCA2, BRIP1, CDC73, CDH1, CDK4, CDKN1B, CDKN2A, CEBPA, CHEK2, CTNNA1, DDX41, DICER1, ETV6, FH, FLCN, GATA2, LZTR1, MAX, MBD4, MEN1, MET, MLH1, MSH2, MSH3, MSH6, MUTYH, NF1, NF2, NTHL1, PALB2, PHOX2B, PMS2, POT1, PRKAR1A, PTCH1, PTEN, RAD51C, RAD51D, RB1, RET, RPS20, RUNX1, SDHA, SDHAF2, SDHB, SDHC, SDHD, SMAD4, SMARCA4, SMARCB1, SMARCE1, STK11, SUFU, TMEM127, TP53, TSC1, TSC2, VHL, and WT1 (sequencing and deletion/duplication); EGFR, HOXB13, KIT, MITF, PDGFRA, POLD1, and POLE (sequencing only); EPCAM and GREM1 (deletion/duplication only).    01/28/2024 -  Chemotherapy   Patient is on Treatment Plan : BREAST DOSE DENSE AC q14d / PACLitaxel q7d       CHIEF COMPLIANT: Cycle 2 dose dense Adriamycin and Cytoxan  HISTORY OF PRESENT ILLNESS:  History of Present Illness Melinda Forbes is a 58 year old female with breast cancer who presents for follow-up after chemotherapy treatment. She is accompanied by her sister, who has also fought breast cancer.  She experiences significant nausea from day three to eight post-treatment. Compazine  is more effective than Phenergan for her nausea, and she uses Zofran  every five hours for additional control.  She feels extremely fatigued and easily winded, with a notable decrease in physical stamina. Previously able to walk six and a half miles without difficulty, she now tires easily.  Her hair began falling out in clumps around the twelfth day after her last treatment.  She uses alazepam sparingly to aid sleep and reports improved sleep quality. Claritin is taken to prevent bone pain associated with her treatment.     ALLERGIES:  is allergic to codeine, morphine  sulfate, and other.  MEDICATIONS:  Current Outpatient Medications  Medication Sig Dispense Refill   ALPRAZolam (XANAX) 0.5 MG tablet  Take 0.5 mg by mouth at bedtime as needed for anxiety.     busPIRone HCl (BUSPAR  PO) Take by mouth.     Calcium Carbonate-Vit D-Min (CALCIUM 1200 PO) Take 1 tablet by mouth at bedtime.      Cranberry 125 MG TABS Take by mouth.     dexamethasone  (DECADRON ) 4 MG tablet Take 1 tablet day after chemo and 1 tablet 2 days after chemo with food 30 tablet 1   docusate sodium (COLACE) 100 MG capsule Take 100 mg by mouth 2 (two) times daily.     esomeprazole  (NEXIUM ) 20 MG capsule at bedtime.     famotidine  (PEPCID ) 20 MG tablet Take 20 mg by mouth at bedtime.     fluticasone (FLONASE) 50 MCG/ACT nasal spray Place 2 sprays into both nostrils daily. 16 g 10   levothyroxine  (SYNTHROID ) 88 MCG tablet Take 1 tablet (88 mcg total) by mouth daily before breakfast.     lidocaine -prilocaine (EMLA) cream Apply to affected area once 30 g 3   Multiple Vitamin (MULTIVITAMIN) tablet Take 1 tablet by mouth daily.     Naproxen Sod-diphenhydrAMINE  (ALEVE PM PO) Take 80 mg by mouth daily.     ondansetron  (ZOFRAN ) 8 MG tablet Take 1 tab (8 mg) by mouth every 8 hrs as needed for nausea/vomiting. Start third day after doxorubicin/cyclophosphamide chemotherapy. 30 tablet 1   prochlorperazine  (COMPAZINE ) 10 MG tablet Take 1 tablet (10 mg total) by mouth every 6 (six) hours as needed for nausea or vomiting. 30 tablet 1   promethazine (PHENERGAN) 12.5 MG tablet Take 1 tablet (12.5 mg total) by mouth every 6 (six) hours as needed for nausea or vomiting. 30 tablet 1   Psyllium Fiber 0.52 g CAPS Take 0.52 g by mouth daily.     RESTASIS 0.05 % ophthalmic emulsion Place into both eyes.     Zinc 50 MG TABS Take by mouth.     No current facility-administered medications for this visit.    PHYSICAL EXAMINATION: ECOG PERFORMANCE STATUS: 1 - Symptomatic but completely ambulatory  Vitals:   02/15/24 0929  BP: 115/78  Pulse: 71  Resp: 18  Temp: (!) 97.4 F (36.3 C)  SpO2: 98%   Filed Weights   02/15/24 0929  Weight: 125 lb 9.6 oz (57 kg)      LABORATORY DATA:  I have reviewed the data as listed     Latest Ref Rng & Units 01/28/2024   11:20 AM 08/19/2020    8:51 AM 12/03/2019    3:00 AM  CMP  Glucose 70 - 99 mg/dL 820  64  896   BUN 6 - 20 mg/dL 13  13  6    Creatinine 0.44 - 1.00 mg/dL 9.41  9.24  9.31   Sodium 135 - 145 mmol/L 139  139  140   Potassium 3.5 - 5.1 mmol/L 3.2  3.9  3.3   Chloride 98 - 111 mmol/L 106  106  106   CO2 22 - 32 mmol/L 27  25  25    Calcium 8.9 - 10.3 mg/dL 9.2  9.1  8.2   Total Protein 6.5 - 8.1 g/dL 6.7   5.7   Total Bilirubin 0.0 - 1.2 mg/dL 0.5   0.3   Alkaline Phos 38 - 126 U/L 65   41   AST 15 - 41 U/L 17   12   ALT 0 - 44 U/L 8   7     Lab Results  Component Value Date  WBC 6.7 01/28/2024   HGB 13.4 01/28/2024   HCT 38.7 01/28/2024   MCV 85.4 01/28/2024   PLT 196 01/28/2024   NEUTROABS 4.3 01/28/2024    ASSESSMENT & PLAN:  Malignant neoplasm of upper-outer quadrant of right breast in female, estrogen receptor positive (HCC) 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, anterior margin excision: Focus of IDC grade 2 with DCIS final margins negative  Oncotype DX: Recurrence score 31 (18% risk of recurrence)   01/05/2024: CT CAP and bone scan: Simple cysts in the liver largest 1.4 cm, several sub-5 mm ill-defined foci too small to characterize, few prominent right axillary lymph nodes, seroma right axilla 4.2 cm, ER 100%, PR 10%, HER2 1+ negative, Ki67 20%   Treatment plan: Adjuvant chemotherapy with dose dense Adriamycin and Cytoxan x 4 followed by Taxol x 12 Adjuvant radiation therapy Antiestrogen therapy ----------------------------------------------------------------------------------------------------------------------------------------- Current treatment: Cycle 2 dose dense Adriamycin and Cytoxan Chemo toxicities: Chemo-induced nausea and vomiting: Will reduce the dosage of today's chemotherapy. Fatigue  Return to clinic in 2 weeks for  cycle 3 ------------------------------------- Assessment and Plan Assessment & Plan  Chemotherapy-induced nausea and vomiting Nausea and vomiting occur post-chemotherapy, with Compazine  more effective than Phenergan. Alazepam aids sleep and nausea management. - Prescribe Compazine  for nausea. - Advise alazepam at night. - Adjust chemotherapy dose to reduce nausea and vomiting. - Avoid outside food on chemotherapy days.  Chemotherapy-induced fatigue Significant fatigue is reported, expected with current regimen. Fatigue is cumulative. - Continue chemotherapy with dose adjustment.  Neuropathy prevention with Taxol Considering study participation for neuropathy management with Taxol using compression and cooling techniques. - Discuss study participation for neuropathy management with Taxol.   No orders of the defined types were placed in this encounter.  The patient has a good understanding of the overall plan. she agrees with it. she will call with any problems that may develop before the next visit here.  I personally spent a total of 45 minutes in the care of the patient today including preparing to see the patient, getting/reviewing separately obtained history, performing a medically appropriate exam/evaluation, counseling and educating, placing orders, referring and communicating with other health care professionals, documenting clinical information in the EHR, independently interpreting results, communicating results, and coordinating care.   Viinay K Yolande Skoda, MD 02/15/24

## 2024-02-16 ENCOUNTER — Encounter: Payer: Self-pay | Admitting: Hematology and Oncology

## 2024-02-17 ENCOUNTER — Inpatient Hospital Stay

## 2024-02-17 ENCOUNTER — Other Ambulatory Visit: Payer: Self-pay | Admitting: *Deleted

## 2024-02-17 VITALS — BP 142/79 | HR 59 | Temp 98.2°F | Resp 17

## 2024-02-17 DIAGNOSIS — Z170 Estrogen receptor positive status [ER+]: Secondary | ICD-10-CM

## 2024-02-17 DIAGNOSIS — Z5111 Encounter for antineoplastic chemotherapy: Secondary | ICD-10-CM | POA: Diagnosis not present

## 2024-02-17 MED ORDER — ESOMEPRAZOLE MAGNESIUM 40 MG PO CPDR
40.0000 mg | DELAYED_RELEASE_CAPSULE | Freq: Every day | ORAL | 1 refills | Status: AC
Start: 2024-02-17 — End: ?

## 2024-02-17 MED ORDER — PEGFILGRASTIM-CBQV 6 MG/0.6ML ~~LOC~~ SOSY
6.0000 mg | PREFILLED_SYRINGE | Freq: Once | SUBCUTANEOUS | Status: AC
  Administered 2024-02-17: 6 mg via SUBCUTANEOUS
  Filled 2024-02-17: qty 0.6

## 2024-02-17 MED ORDER — SUCRALFATE 1 G PO TABS: 1.0000 g | ORAL_TABLET | Freq: Three times a day (TID) | ORAL | 1 refills | Status: DC

## 2024-02-17 NOTE — Progress Notes (Signed)
 Received call from pt stating she is experiencing an increase in acid reflux despite taking Nexium  20 mg p.o daily as well as Pepcid  20 mg p.o daily.  RN reviewed with MD and verbal orders received and placed for pt to be prescribed Nexium  40 mg p.o daily as well as Carafate 1 g p.o TID.  Prescription sent to pharmacy on file, pt educated and verbalized understanding.

## 2024-02-18 ENCOUNTER — Other Ambulatory Visit: Payer: Self-pay

## 2024-02-18 ENCOUNTER — Encounter: Payer: Self-pay | Admitting: Hematology and Oncology

## 2024-02-21 ENCOUNTER — Encounter: Payer: Self-pay | Admitting: *Deleted

## 2024-02-24 ENCOUNTER — Encounter: Payer: Self-pay | Admitting: Hematology and Oncology

## 2024-02-27 ENCOUNTER — Other Ambulatory Visit: Payer: Self-pay

## 2024-02-28 ENCOUNTER — Encounter: Payer: Self-pay | Admitting: Hematology and Oncology

## 2024-02-28 MED FILL — Dexamethasone Sodium Phosphate Inj 100 MG/10ML: INTRAMUSCULAR | Qty: 1 | Status: AC

## 2024-02-29 ENCOUNTER — Inpatient Hospital Stay

## 2024-02-29 ENCOUNTER — Other Ambulatory Visit: Payer: Self-pay | Admitting: *Deleted

## 2024-02-29 ENCOUNTER — Other Ambulatory Visit: Payer: Self-pay

## 2024-02-29 ENCOUNTER — Encounter: Payer: Self-pay | Admitting: Hematology and Oncology

## 2024-02-29 ENCOUNTER — Other Ambulatory Visit (HOSPITAL_COMMUNITY): Payer: Self-pay

## 2024-02-29 ENCOUNTER — Encounter: Payer: Self-pay | Admitting: *Deleted

## 2024-02-29 ENCOUNTER — Inpatient Hospital Stay: Admitting: Hematology and Oncology

## 2024-02-29 VITALS — BP 130/80 | HR 56 | Temp 98.2°F | Resp 16 | Ht 63.0 in | Wt 127.2 lb

## 2024-02-29 DIAGNOSIS — Z5111 Encounter for antineoplastic chemotherapy: Secondary | ICD-10-CM | POA: Diagnosis not present

## 2024-02-29 DIAGNOSIS — Z170 Estrogen receptor positive status [ER+]: Secondary | ICD-10-CM

## 2024-02-29 DIAGNOSIS — C50411 Malignant neoplasm of upper-outer quadrant of right female breast: Secondary | ICD-10-CM | POA: Diagnosis not present

## 2024-02-29 LAB — CBC WITH DIFFERENTIAL (CANCER CENTER ONLY)
Abs Immature Granulocytes: 6.11 K/uL — ABNORMAL HIGH (ref 0.00–0.07)
Abs Immature Granulocytes: 6.66 K/uL — ABNORMAL HIGH (ref 0.00–0.07)
Basophils Absolute: 0.1 K/uL (ref 0.0–0.1)
Basophils Absolute: 0.4 K/uL — ABNORMAL HIGH (ref 0.0–0.1)
Basophils Relative: 0 %
Basophils Relative: 1 %
Eosinophils Absolute: 0.2 K/uL (ref 0.0–0.5)
Eosinophils Absolute: 0.2 K/uL (ref 0.0–0.5)
Eosinophils Relative: 1 %
Eosinophils Relative: 1 %
HCT: 33.1 % — ABNORMAL LOW (ref 36.0–46.0)
HCT: 33.5 % — ABNORMAL LOW (ref 36.0–46.0)
Hemoglobin: 11.4 g/dL — ABNORMAL LOW (ref 12.0–15.0)
Hemoglobin: 11.4 g/dL — ABNORMAL LOW (ref 12.0–15.0)
Immature Granulocytes: 19 %
Immature Granulocytes: 20 %
Lymphocytes Relative: 7 %
Lymphocytes Relative: 7 %
Lymphs Abs: 2.2 K/uL (ref 0.7–4.0)
Lymphs Abs: 2.2 K/uL (ref 0.7–4.0)
MCH: 30.2 pg (ref 26.0–34.0)
MCH: 30.1 pg (ref 26.0–34.0)
MCHC: 34.4 g/dL (ref 30.0–36.0)
MCHC: 34 g/dL (ref 30.0–36.0)
MCV: 87.8 fL (ref 80.0–100.0)
MCV: 88.4 fL (ref 80.0–100.0)
Monocytes Absolute: 1.4 K/uL — ABNORMAL HIGH (ref 0.1–1.0)
Monocytes Absolute: 1.5 K/uL — ABNORMAL HIGH (ref 0.1–1.0)
Monocytes Relative: 5 %
Monocytes Relative: 4 %
Neutro Abs: 22.1 K/uL — ABNORMAL HIGH (ref 1.7–7.7)
Neutro Abs: 23.2 K/uL — ABNORMAL HIGH (ref 1.7–7.7)
Neutrophils Relative %: 68 %
Neutrophils Relative %: 67 %
Platelet Count: 90 K/uL — ABNORMAL LOW (ref 150–400)
Platelet Count: 85 K/uL — ABNORMAL LOW (ref 150–400)
RBC: 3.77 MIL/uL — ABNORMAL LOW (ref 3.87–5.11)
RBC: 3.79 MIL/uL — ABNORMAL LOW (ref 3.87–5.11)
RDW: 14.7 % (ref 11.5–15.5)
RDW: 14.8 % (ref 11.5–15.5)
Smear Review: NORMAL
Smear Review: NORMAL
WBC Count: 32.2 K/uL — ABNORMAL HIGH (ref 4.0–10.5)
WBC Count: 34.2 K/uL — ABNORMAL HIGH (ref 4.0–10.5)
nRBC: 0.1 % (ref 0.0–0.2)
nRBC: 0.1 % (ref 0.0–0.2)

## 2024-02-29 LAB — CMP (CANCER CENTER ONLY)
ALT: 19 U/L (ref 0–44)
AST: 26 U/L (ref 15–41)
Albumin: 4.2 g/dL (ref 3.5–5.0)
Alkaline Phosphatase: 132 U/L — ABNORMAL HIGH (ref 38–126)
Anion gap: 6 (ref 5–15)
BUN: 12 mg/dL (ref 6–20)
CO2: 27 mmol/L (ref 22–32)
Calcium: 8.9 mg/dL (ref 8.9–10.3)
Chloride: 106 mmol/L (ref 98–111)
Creatinine: 0.49 mg/dL (ref 0.44–1.00)
GFR, Estimated: >60 mL/min (ref >60)
Glucose, Bld: 107 mg/dL — ABNORMAL HIGH (ref 70–99)
Potassium: 3.7 mmol/L (ref 3.5–5.1)
Sodium: 139 mmol/L (ref 135–145)
Total Bilirubin: 0.3 mg/dL (ref 0.0–1.2)
Total Protein: 6.3 g/dL — ABNORMAL LOW (ref 6.5–8.1)

## 2024-02-29 MED ORDER — PALONOSETRON HCL INJECTION 0.25 MG/5ML
0.2500 mg | Freq: Once | INTRAVENOUS | Status: AC
  Administered 2024-02-29: 0.25 mg via INTRAVENOUS
  Filled 2024-02-29: qty 5

## 2024-02-29 MED ORDER — PEGFILGRASTIM-CBQV 6 MG/0.6ML ~~LOC~~ SOSY
6.0000 mg | PREFILLED_SYRINGE | Freq: Once | SUBCUTANEOUS | 0 refills | Status: DC
  Filled 2024-02-29: qty 0.6, 1d supply, fill #0

## 2024-02-29 MED ORDER — APREPITANT 130 MG/18ML IV EMUL
130.0000 mg | Freq: Once | INTRAVENOUS | Status: AC
  Administered 2024-02-29: 130 mg via INTRAVENOUS
  Filled 2024-02-29: qty 18

## 2024-02-29 MED ORDER — SODIUM CHLORIDE 0.9 % IV SOLN
500.0000 mg/m2 | Freq: Once | INTRAVENOUS | Status: AC
  Administered 2024-02-29: 800 mg via INTRAVENOUS
  Filled 2024-02-29: qty 40

## 2024-02-29 MED ORDER — DOXORUBICIN HCL CHEMO IV INJECTION 2 MG/ML
50.0000 mg/m2 | Freq: Once | INTRAVENOUS | Status: AC
  Administered 2024-02-29: 80 mg via INTRAVENOUS
  Filled 2024-02-29: qty 40

## 2024-02-29 MED ORDER — SODIUM CHLORIDE 0.9 % IV SOLN: INTRAVENOUS | Status: DC

## 2024-02-29 MED ORDER — PEGFILGRASTIM-CBQV 6 MG/0.6ML ~~LOC~~ SOSY: 6.0000 mg | PREFILLED_SYRINGE | Freq: Once | SUBCUTANEOUS | 1 refills | Status: AC

## 2024-02-29 MED ORDER — SODIUM CHLORIDE 0.9 % IV SOLN
10.0000 mg | Freq: Once | INTRAVENOUS | Status: AC
  Administered 2024-02-29: 10 mg via INTRAVENOUS
  Filled 2024-02-29: qty 10

## 2024-02-29 NOTE — Research (Addendum)
 [id number], ICE COMPRESS: RANDOMIZED TRIAL OF LIMB CRYOCOMPRESSION VERSUS CONTINUOUS COMPRESSION VERSUS LOW CYCLIC COMPRESSION FOR THE PREVENTION  OF TAXANE-INDUCED PERIPHERAL NEUROPATHY  Research RN saw pt in infusion. Pt and family stated they did have time to read over the consent and had no questions at this time. Pt stated she was willing to participate in the this study. Research RN will see pt on 03-13-24 at 0900 prior to port appointment. RN will consent patient, have pt fill out pro's and perform assessments during this visit. Pt has contact information for research Rn if questions or concerns arise. Pt was thanked for her time and willingness to continue on the process of participation with this study.   Delon Pinal BSN RN Clinical Research Nurse [facility] Direct Dial: [phone] 02/29/2024  12:05 PM

## 2024-02-29 NOTE — Progress Notes (Signed)
 Pt notified that insurance will no longer cover her to receive Udenyca injection here in office and pt will need to self administer at home.  Pt states her daughter is an oncology nurse and will be able to assist with that.  Prescription sent to [facility] mail order pharmacy.  RN also contact Mckenna, case worker [phone] ext [phone]) and LVM stating she needed to expedite this request as pt is due for injection this Thursday.  RN also faxed order to Mckenna at [phone].

## 2024-02-29 NOTE — Progress Notes (Signed)
 Received message from PA team stating pt insurance will no longer cover Udenyca injection to be giving in office.  States a P2P is set up for tomorrow from 2-4 pm and pt is due for Udenyca injection on 11/20.

## 2024-02-29 NOTE — Assessment & Plan Note (Signed)
 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, anterior margin excision: Focus of IDC grade 2 with DCIS final margins negative  Oncotype DX: Recurrence score 31 (18% risk of recurrence)   01/05/2024: CT CAP and bone scan: Simple cysts in the liver largest 1.4 cm, several sub-5 mm ill-defined foci too small to characterize, few prominent right axillary lymph nodes, seroma right axilla 4.2 cm, ER 100%, PR 10%, HER2 1+ negative, Ki67 20%   Treatment plan: Adjuvant chemotherapy with dose dense Adriamycin and Cytoxan x 4 followed by Taxol x 12 Adjuvant radiation therapy Antiestrogen therapy ----------------------------------------------------------------------------------------------------------------------------------------- Current treatment: Cycle 3 dose dense Adriamycin and Cytoxan Chemo toxicities: Chemo-induced nausea and vomiting: Will reduce the dosage of today's chemotherapy. Fatigue   Return to clinic in 2 weeks for cycle 4

## 2024-02-29 NOTE — Patient Instructions (Signed)
 [facility] - A DEPT OF [facility]  Discharge Instructions: Thank you for choosing [facility] to provide your oncology and hematology care.   If you have a lab appointment with the Cancer Center, please go directly to the Cancer Center and check in at the registration area.   Wear comfortable clothing and clothing appropriate for easy access to any Portacath or PICC line.   We strive to give you quality time with your provider. You may need to reschedule your appointment if you arrive late (15 or more minutes).  Arriving late affects you and other patients whose appointments are after yours.  Also, if you miss three or more appointments without notifying the office, you may be dismissed from the clinic at the provider's discretion.      For prescription refill requests, have your pharmacy contact our office and allow 72 hours for refills to be completed.    Today you received the following chemotherapy and/or immunotherapy agents doxorubicin, cytoxan    To help prevent nausea and vomiting after your treatment, we encourage you to take your nausea medication as directed.  BELOW ARE SYMPTOMS THAT SHOULD BE REPORTED IMMEDIATELY: *FEVER GREATER THAN 100.4 F (38 C) OR HIGHER *CHILLS OR SWEATING *NAUSEA AND VOMITING THAT IS NOT CONTROLLED WITH YOUR NAUSEA MEDICATION *UNUSUAL SHORTNESS OF BREATH *UNUSUAL BRUISING OR BLEEDING *URINARY PROBLEMS (pain or burning when urinating, or frequent urination) *BOWEL PROBLEMS (unusual diarrhea, constipation, pain near the anus) TENDERNESS IN MOUTH AND THROAT WITH OR WITHOUT PRESENCE OF ULCERS (sore throat, sores in mouth, or a toothache) UNUSUAL RASH, SWELLING OR PAIN  UNUSUAL VAGINAL DISCHARGE OR ITCHING   Items with * indicate a potential emergency and should be followed up as soon as possible or go to the Emergency Department if any problems should occur.  Please show the CHEMOTHERAPY ALERT CARD or  IMMUNOTHERAPY ALERT CARD at check-in to the Emergency Department and triage nurse.  Should you have questions after your visit or need to cancel or reschedule your appointment, please contact [facility] - A DEPT OF [facility]  Dept: [phone]  and follow the prompts.  Office hours are 8:00 a.m. to 4:30 p.m. Monday - Friday. Please note that voicemails left after 4:00 p.m. may not be returned until the following business day.  We are closed weekends and major holidays. You have access to a nurse at all times for urgent questions. Please call the main number to the clinic Dept: [phone] and follow the prompts.   For any non-urgent questions, you may also contact your provider using MyChart. We now offer e-Visits for anyone 5 and older to request care online for non-urgent symptoms. For details visit [email or web address].   Also download the MyChart app! Go to the app store, search MyChart, open the app, select [facility], and log in with your MyChart username and password.

## 2024-02-29 NOTE — Progress Notes (Signed)
 Received call from Mckenna [phone]) stating she would not be able to get pt set up for home delivery of injection for this week on 03/02/24 and that [employer] would approve for that dose to be given here at [facility].  States she will fax auth number to Darlena with PA team.  States pt will receive last dose of Udenyca at home on 03/16/24.  Pt notified and PA notified, everyone verbalized understanding.

## 2024-02-29 NOTE — Progress Notes (Signed)
 Patient Care Team: [name], MD as PCP - General (Family Medicine) [name], MD as Consulting Physician (Endocrinology) [name], MD as Consulting Physician (Plastic Surgery) [name], MD as Consulting Physician (General Surgery) [name], RN as Oncology Nurse Navigator [name], Devere HERO, RN as Oncology Nurse Navigator Odean Potts, MD as Consulting Physician (Hematology and Oncology) [name], MD as Consulting Physician (General Surgery) [name], MD as Consulting Physician (Radiation Oncology)  DIAGNOSIS:  Encounter Diagnosis  Name Primary?   Malignant neoplasm of upper-outer quadrant of right breast in female, estrogen receptor positive (HCC) Yes    SUMMARY OF ONCOLOGIC HISTORY: Oncology History  Malignant neoplasm of upper-outer quadrant of right breast in female, estrogen receptor positive (HCC)  12/09/2023 Initial Diagnosis   Palpable mass in the right breast measured 1.6 cm by mammogram and ultrasound.  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, margins negative, 1/3 lymph node with micrometastatic disease, anterior margin excision: Focus of IDC grade 2 with DCIS final margins negative   12/29/2023 Cancer Staging   Staging form: Breast, AJCC 8th Edition - Pathologic stage from 12/29/2023: Stage IB (pT2, pN17mi(sn), cM0, G2, ER+, PR+, HER2-) - Signed by [name], PA-C on 12/29/2023 Stage prefix: Initial diagnosis Method of lymph node assessment: Sentinel lymph node biopsy Histologic grading system: 3 grade system   01/25/2024 Genetic Testing   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 The report date is 01/25/2024.  The CancerNext-Expanded gene panel offered by [facility] and includes sequencing, rearrangement, and RNA analysis for the  following 77 genes: AIP, ALK, APC, ATM, AXIN2, BAP1, BARD1, BMPR1A, BRCA1, BRCA2, BRIP1, CDC73, CDH1, CDK4, CDKN1B, CDKN2A, CEBPA, CHEK2, CTNNA1, DDX41, DICER1, ETV6, FH, FLCN, GATA2, LZTR1, MAX, MBD4, MEN1, MET, MLH1, MSH2, MSH3, MSH6, MUTYH, NF1, NF2, NTHL1, PALB2, PHOX2B, PMS2, POT1, PRKAR1A, PTCH1, PTEN, RAD51C, RAD51D, RB1, RET, RPS20, RUNX1, SDHA, SDHAF2, SDHB, SDHC, SDHD, SMAD4, SMARCA4, SMARCB1, SMARCE1, STK11, SUFU, TMEM127, TP53, TSC1, TSC2, VHL, and WT1 (sequencing and deletion/duplication); EGFR, HOXB13, KIT, MITF, PDGFRA, POLD1, and POLE (sequencing only); EPCAM and GREM1 (deletion/duplication only).    01/28/2024 -  Chemotherapy   Patient is on Treatment Plan : BREAST DOSE DENSE AC q14d / PACLitaxel q7d       CHIEF COMPLIANT: Cycle 3 Adriamycin and Cytoxan  HISTORY OF PRESENT ILLNESS:  History of Present Illness Melinda Forbes Melinda Forbes is a 58 year old female with breast cancer who presents with chemotherapy-induced nausea.  She experiences significant nausea following chemotherapy treatments, particularly severe over the weekend, affecting her ability to engage in usual activities. By Sunday night, the nausea becomes more manageable, though she still feels unwell. Her chemotherapy is scheduled on Tuesdays, with the worst symptoms on Friday and Saturday.  Her platelet count is currently at ninety, down from the two hundreds. Her white blood cell count is elevated at thirty-two. Hemoglobin has decreased from thirteen to eleven point four. She tolerates her bone shots well without symptoms.     ALLERGIES:  is allergic to codeine, morphine  sulfate, and other.  MEDICATIONS:  Current Outpatient Medications  Medication Sig Dispense Refill   ALPRAZolam (XANAX) 0.5 MG tablet Take 0.5 mg by mouth at bedtime as needed for anxiety.     busPIRone HCl (BUSPAR PO) Take by mouth.     Calcium Carbonate-Vit D-Min (  CALCIUM 1200 PO) Take 1 tablet by mouth at bedtime.      Cranberry 125 MG  TABS Take by mouth.     dexamethasone  (DECADRON ) 4 MG tablet Take 1 tablet day after chemo and 1 tablet 2 days after chemo with food 30 tablet 1   docusate sodium (COLACE) 100 MG capsule Take 100 mg by mouth 2 (two) times daily.     esomeprazole  (NEXIUM ) 40 MG capsule Take 1 capsule (40 mg total) by mouth daily at 12 noon. 90 capsule 1   famotidine  (PEPCID ) 20 MG tablet Take 20 mg by mouth at bedtime.     fluticasone (FLONASE) 50 MCG/ACT nasal spray Place 2 sprays into both nostrils daily. 16 g 10   levothyroxine  (SYNTHROID ) 88 MCG tablet Take 1 tablet (88 mcg total) by mouth daily before breakfast.     lidocaine -prilocaine (EMLA) cream Apply to affected area once 30 g 3   Multiple Vitamin (MULTIVITAMIN) tablet Take 1 tablet by mouth daily.     Naproxen Sod-diphenhydrAMINE  (ALEVE PM PO) Take 80 mg by mouth daily.     prochlorperazine  (COMPAZINE ) 10 MG tablet Take 1 tablet (10 mg total) by mouth every 6 (six) hours as needed for nausea or vomiting. 30 tablet 1   promethazine (PHENERGAN) 12.5 MG tablet Take 1 tablet (12.5 mg total) by mouth every 6 (six) hours as needed for nausea or vomiting. 30 tablet 1   Psyllium Fiber 0.52 g CAPS Take 0.52 g by mouth daily.     RESTASIS 0.05 % ophthalmic emulsion Place into both eyes.     sucralfate (CARAFATE) 1 g tablet Take 1 tablet (1 g total) by mouth 4 (four) times daily -  with meals and at bedtime. 120 tablet 1   Zinc 50 MG TABS Take by mouth.     ondansetron  (ZOFRAN ) 8 MG tablet Take 1 tab (8 mg) by mouth every 8 hrs as needed for nausea/vomiting. Start third day after doxorubicin/cyclophosphamide chemotherapy. (Patient not taking: Reported on 02/29/2024) 30 tablet 1   No current facility-administered medications for this visit.    PHYSICAL EXAMINATION: ECOG PERFORMANCE STATUS: 1 - Symptomatic but completely ambulatory  Vitals:   02/29/24 1015  BP: 130/80  Pulse: (!) 56  Resp: 16  Temp: 98.2 F (36.8 C)  SpO2: 100%   Filed Weights    02/29/24 1015  Weight: 127 lb 3.2 oz (57.7 kg)    LABORATORY DATA:  I have reviewed the data as listed    Latest Ref Rng & Units 02/15/2024    9:03 AM 01/28/2024   11:20 AM 08/19/2020    8:51 AM  CMP  Glucose 70 - 99 mg/dL 882  820  64   BUN 6 - 20 mg/dL 16  13  13    Creatinine 0.44 - 1.00 mg/dL 9.38  9.41  9.24   Sodium 135 - 145 mmol/L 140  139  139   Potassium 3.5 - 5.1 mmol/L 3.6  3.2  3.9   Chloride 98 - 111 mmol/L 105  106  106   CO2 22 - 32 mmol/L 28  27  25    Calcium 8.9 - 10.3 mg/dL 9.3  9.2  9.1   Total Protein 6.5 - 8.1 g/dL 6.8  6.7    Total Bilirubin 0.0 - 1.2 mg/dL 0.3  0.5    Alkaline Phos 38 - 126 U/L 108  65    AST 15 - 41 U/L 32  17    ALT 0 - 44  U/L 26  8      Lab Results  Component Value Date   WBC 32.2 (H) 02/29/2024   HGB 11.4 (L) 02/29/2024   HCT 33.1 (L) 02/29/2024   MCV 87.8 02/29/2024   PLT 90 (L) 02/29/2024   NEUTROABS PENDING 02/29/2024    ASSESSMENT & PLAN:  Malignant neoplasm of upper-outer quadrant of right breast in female, estrogen receptor positive (HCC) 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, anterior margin excision: Focus of IDC grade 2 with DCIS final margins negative  Oncotype DX: Recurrence score 31 (18% risk of recurrence)   01/05/2024: CT CAP and bone scan: Simple cysts in the liver largest 1.4 cm, several sub-5 mm ill-defined foci too small to characterize, few prominent right axillary lymph nodes, seroma right axilla 4.2 cm, ER 100%, PR 10%, HER2 1+ negative, Ki67 20%   Treatment plan: Adjuvant chemotherapy with dose dense Adriamycin and Cytoxan x 4 followed by Taxol x 12 Adjuvant radiation therapy Antiestrogen therapy ----------------------------------------------------------------------------------------------------------------------------------------- Current treatment: Cycle 3 dose dense Adriamycin and Cytoxan Chemo  toxicities: Chemo-induced nausea and vomiting: She continued to have nausea and vomiting in spite of lowering the dosage.  She will try taking alprazolam during the daytime to reduce the nausea for those 2 days. Fatigue Thrombocytopenia: We will need to make sure that it is not a false reading.  Will recheck her CBC.  But it is fine to proceed with today's treatment. Mild anemia secondary to chemotherapy: Being watched and monitored  Patient tells me that her CT scans and bone scan have not been approved.  We will try and figure out why that is the case.  She was being evaluated for breast cancer with abdominal and back symptoms that required the CT scans to be done.  Neulasta approval issues: Peer-to-peer is being done tomorrow by Morna.   Return to clinic in 2 weeks for cycle 4   No orders of the defined types were placed in this encounter.  The patient has a good understanding of the overall plan. she agrees with it. she will call with any problems that may develop before the next visit here.  I personally spent a total of 30 minutes in the care of the patient today including preparing to see the patient, getting/reviewing separately obtained history, performing a medically appropriate exam/evaluation, counseling and educating, placing orders, referring and communicating with other health care professionals, documenting clinical information in the EHR, independently interpreting results, communicating results, and coordinating care.   Melinda K Rayley Gao, MD 02/29/24

## 2024-03-02 ENCOUNTER — Other Ambulatory Visit: Payer: Self-pay

## 2024-03-02 ENCOUNTER — Inpatient Hospital Stay

## 2024-03-02 VITALS — BP 130/69 | HR 59 | Temp 98.7°F | Resp 16

## 2024-03-02 DIAGNOSIS — Z5111 Encounter for antineoplastic chemotherapy: Secondary | ICD-10-CM | POA: Diagnosis not present

## 2024-03-02 DIAGNOSIS — C50411 Malignant neoplasm of upper-outer quadrant of right female breast: Secondary | ICD-10-CM

## 2024-03-02 MED ORDER — PEGFILGRASTIM-CBQV 6 MG/0.6ML ~~LOC~~ SOSY
6.0000 mg | PREFILLED_SYRINGE | Freq: Once | SUBCUTANEOUS | Status: AC
  Administered 2024-03-02: 6 mg via SUBCUTANEOUS
  Filled 2024-03-02: qty 0.6

## 2024-03-03 ENCOUNTER — Other Ambulatory Visit: Payer: Self-pay | Admitting: Hematology and Oncology

## 2024-03-03 DIAGNOSIS — Z170 Estrogen receptor positive status [ER+]: Secondary | ICD-10-CM

## 2024-03-07 ENCOUNTER — Other Ambulatory Visit: Payer: Self-pay | Admitting: Hematology and Oncology

## 2024-03-10 MED FILL — Dexamethasone Sodium Phosphate Inj 100 MG/10ML: INTRAMUSCULAR | Qty: 1 | Status: AC

## 2024-03-13 ENCOUNTER — Inpatient Hospital Stay: Attending: Hematology and Oncology

## 2024-03-13 ENCOUNTER — Inpatient Hospital Stay

## 2024-03-13 ENCOUNTER — Inpatient Hospital Stay: Admitting: Hematology and Oncology

## 2024-03-13 VITALS — BP 116/70 | HR 69 | Temp 97.6°F | Resp 17 | Ht 63.0 in | Wt 126.5 lb

## 2024-03-13 DIAGNOSIS — C50411 Malignant neoplasm of upper-outer quadrant of right female breast: Secondary | ICD-10-CM | POA: Diagnosis present

## 2024-03-13 DIAGNOSIS — Z79632 Long term (current) use of antitumor antibiotic: Secondary | ICD-10-CM | POA: Diagnosis not present

## 2024-03-13 DIAGNOSIS — Z5111 Encounter for antineoplastic chemotherapy: Secondary | ICD-10-CM | POA: Insufficient documentation

## 2024-03-13 DIAGNOSIS — Z79630 Long term (current) use of alkylating agent: Secondary | ICD-10-CM | POA: Insufficient documentation

## 2024-03-13 DIAGNOSIS — Z170 Estrogen receptor positive status [ER+]: Secondary | ICD-10-CM | POA: Diagnosis not present

## 2024-03-13 LAB — CBC WITH DIFFERENTIAL (CANCER CENTER ONLY)
Abs Immature Granulocytes: 7.13 K/uL — ABNORMAL HIGH (ref 0.00–0.07)
Basophils Absolute: 0.1 K/uL (ref 0.0–0.1)
Basophils Relative: 0 %
Eosinophils Absolute: 0.1 K/uL (ref 0.0–0.5)
Eosinophils Relative: 0 %
HCT: 32.1 % — ABNORMAL LOW (ref 36.0–46.0)
Hemoglobin: 10.8 g/dL — ABNORMAL LOW (ref 12.0–15.0)
Immature Granulocytes: 21 %
Lymphocytes Relative: 6 %
Lymphs Abs: 2.1 K/uL (ref 0.7–4.0)
MCH: 30.2 pg (ref 26.0–34.0)
MCHC: 33.6 g/dL (ref 30.0–36.0)
MCV: 89.7 fL (ref 80.0–100.0)
Monocytes Absolute: 1.8 K/uL — ABNORMAL HIGH (ref 0.1–1.0)
Monocytes Relative: 5 %
Neutro Abs: 23.2 K/uL — ABNORMAL HIGH (ref 1.7–7.7)
Neutrophils Relative %: 68 %
Platelet Count: 146 K/uL — ABNORMAL LOW (ref 150–400)
RBC: 3.58 MIL/uL — ABNORMAL LOW (ref 3.87–5.11)
RDW: 16.4 % — ABNORMAL HIGH (ref 11.5–15.5)
Smear Review: NORMAL
WBC Count: 34.4 K/uL — ABNORMAL HIGH (ref 4.0–10.5)
nRBC: 0.1 % (ref 0.0–0.2)

## 2024-03-13 LAB — CMP (CANCER CENTER ONLY)
ALT: 17 U/L (ref 0–44)
AST: 26 U/L (ref 15–41)
Albumin: 4.4 g/dL (ref 3.5–5.0)
Alkaline Phosphatase: 167 U/L — ABNORMAL HIGH (ref 38–126)
Anion gap: 9 (ref 5–15)
BUN: 8 mg/dL (ref 6–20)
CO2: 26 mmol/L (ref 22–32)
Calcium: 9 mg/dL (ref 8.9–10.3)
Chloride: 105 mmol/L (ref 98–111)
Creatinine: 0.46 mg/dL (ref 0.44–1.00)
GFR, Estimated: >60 mL/min (ref >60)
Glucose, Bld: 98 mg/dL (ref 70–99)
Potassium: 4 mmol/L (ref 3.5–5.1)
Sodium: 140 mmol/L (ref 135–145)
Total Bilirubin: 0.2 mg/dL (ref 0.0–1.2)
Total Protein: 6.6 g/dL (ref 6.5–8.1)

## 2024-03-13 MED ORDER — SODIUM CHLORIDE 0.9 % IV SOLN
10.0000 mg | Freq: Once | INTRAVENOUS | Status: AC
  Administered 2024-03-13: 10 mg via INTRAVENOUS
  Filled 2024-03-13: qty 10

## 2024-03-13 MED ORDER — SODIUM CHLORIDE 0.9 % IV SOLN: INTRAVENOUS | Status: DC

## 2024-03-13 MED ORDER — DOXORUBICIN HCL CHEMO IV INJECTION 2 MG/ML
50.0000 mg/m2 | Freq: Once | INTRAVENOUS | Status: AC
  Administered 2024-03-13: 80 mg via INTRAVENOUS
  Filled 2024-03-13: qty 40

## 2024-03-13 MED ORDER — APREPITANT 130 MG/18ML IV EMUL
130.0000 mg | Freq: Once | INTRAVENOUS | Status: AC
  Administered 2024-03-13: 130 mg via INTRAVENOUS
  Filled 2024-03-13: qty 18

## 2024-03-13 MED ORDER — PALONOSETRON HCL INJECTION 0.25 MG/5ML
0.2500 mg | Freq: Once | INTRAVENOUS | Status: AC
  Administered 2024-03-13: 0.25 mg via INTRAVENOUS
  Filled 2024-03-13: qty 5

## 2024-03-13 MED ORDER — SODIUM CHLORIDE 0.9 % IV SOLN
500.0000 mg/m2 | Freq: Once | INTRAVENOUS | Status: AC
  Administered 2024-03-13: 800 mg via INTRAVENOUS
  Filled 2024-03-13: qty 40

## 2024-03-13 NOTE — Patient Instructions (Signed)
 [facility] - A DEPT OF [facility]  Discharge Instructions: Thank you for choosing [facility] to provide your oncology and hematology care.   If you have a lab appointment with the Cancer Center, please go directly to the Cancer Center and check in at the registration area.   Wear comfortable clothing and clothing appropriate for easy access to any Portacath or PICC line.   We strive to give you quality time with your provider. You may need to reschedule your appointment if you arrive late (15 or more minutes).  Arriving late affects you and other patients whose appointments are after yours.  Also, if you miss three or more appointments without notifying the office, you may be dismissed from the clinic at the provider's discretion.      For prescription refill requests, have your pharmacy contact our office and allow 72 hours for refills to be completed.    Today you received the following chemotherapy and/or immunotherapy agents doxorubicin, cytoxan    To help prevent nausea and vomiting after your treatment, we encourage you to take your nausea medication as directed.  BELOW ARE SYMPTOMS THAT SHOULD BE REPORTED IMMEDIATELY: *FEVER GREATER THAN 100.4 F (38 C) OR HIGHER *CHILLS OR SWEATING *NAUSEA AND VOMITING THAT IS NOT CONTROLLED WITH YOUR NAUSEA MEDICATION *UNUSUAL SHORTNESS OF BREATH *UNUSUAL BRUISING OR BLEEDING *URINARY PROBLEMS (pain or burning when urinating, or frequent urination) *BOWEL PROBLEMS (unusual diarrhea, constipation, pain near the anus) TENDERNESS IN MOUTH AND THROAT WITH OR WITHOUT PRESENCE OF ULCERS (sore throat, sores in mouth, or a toothache) UNUSUAL RASH, SWELLING OR PAIN  UNUSUAL VAGINAL DISCHARGE OR ITCHING   Items with * indicate a potential emergency and should be followed up as soon as possible or go to the Emergency Department if any problems should occur.  Please show the CHEMOTHERAPY ALERT CARD or  IMMUNOTHERAPY ALERT CARD at check-in to the Emergency Department and triage nurse.  Should you have questions after your visit or need to cancel or reschedule your appointment, please contact [facility] - A DEPT OF [facility]  Dept: [phone]  and follow the prompts.  Office hours are 8:00 a.m. to 4:30 p.m. Monday - Friday. Please note that voicemails left after 4:00 p.m. may not be returned until the following business day.  We are closed weekends and major holidays. You have access to a nurse at all times for urgent questions. Please call the main number to the clinic Dept: [phone] and follow the prompts.   For any non-urgent questions, you may also contact your provider using MyChart. We now offer e-Visits for anyone 5 and older to request care online for non-urgent symptoms. For details visit [email or web address].   Also download the MyChart app! Go to the app store, search MyChart, open the app, select [facility], and log in with your MyChart username and password.

## 2024-03-13 NOTE — Research (Signed)
 [id number], ICE COMPRESS: RANDOMIZED TRIAL OF LIMB CRYOCOMPRESSION VERSUS CONTINUOUS COMPRESSION VERSUS LOW CYCLIC COMPRESSION FOR THE PREVENTION  OF TAXANE-INDUCED PERIPHERAL NEUROPATHY  Patient Melinda Forbes was identified by Dr. Gudnea as a potential candidate for the above listed study.  This Clinical Research Nurse met with Aleysha Bunton [place], [id number] on 03/13/24 in a manner and location that ensures patient privacy to discuss participation in the above listed research study.  Patient is Accompanied by her husband.  Patient was previously provided with informed consent documents.  Patient confirmed they have read the informed consent documents.  As outlined in the informed consent form, this Nurse and Alaine Arleen Hurst discussed the purpose of the research study, the investigational nature of the study, study procedures and requirements for study participation, potential risks and benefits of study participation, as well as alternatives to participation.  This study is not blinded or double-blinded. The patient understands participation is voluntary and they may withdraw from study participation at any time.  Each study arm was reviewed, and randomization discussed. Potential side effects were reviewed with patient as outlined in the consent form, and patient made aware there may be side effects not yet known.  This study does not involve a placebo. Patient understands enrollment is pending full eligibility review.   Confidentiality and how the patient's information will be used as part of study participation were discussed.  Patient was informed there is not reimbursement provided for their time and effort spent on trial participation.  The patient is encouraged to discuss research study participation with their insurance provider to determine what costs they may incur as part of study participation, including research related injury.    All questions were answered to patient's satisfaction.   The informed consent and separate HIPAA Authorization was reviewed page by page.  The patient's mental and emotional status is appropriate to provide informed consent, and the patient verbalizes an understanding of study participation.  Patient has agreed to participate in the above listed research study and has voluntarily signed the informed consent version date 09-30-23 and separate HIPAA Authorization, version CIRB date 12-10-23 on 03/13/24 at 0915AM.  The patient was provided with a copy of the signed informed consent form and separate HIPAA Authorization for their reference.  No study specific procedures were obtained prior to the signing of the informed consent document.  Approximately 20 minutes were spent with the patient reviewing the informed consent documents.  Patient was not requested to complete a Release of Information form.  Pt consented to blood draw, sample biobanking, and contact for future research studies.    ELIGIBILITY CHECK   Confirmed the following with patient today: - The patient does not have a history of skin or limb metastases.  - The patient has not previously received neurotoxic chemotherapy for any reason. - The patient does not have pre-existing clinical peripheral neuropathy from any cause. - The patient does not have a history of Raynaud's phenomenon, cold agglutinin disease, cryoglobulinemia, cryofibrinogenemia, post-traumatic cold dystrophy, or peripheral arterial ischemia. - The patient does not have any open skin wounds or ulcers of the limbs.   Patient consented to participate in specimen banking.   PRO's and Assessments: Patient states they are able to complete PRO questionnaires in English. Pt completed PRO's on 03-13-24 while in clinic. Tuning fork, nueropen, and timed get up and go also performed.    Pt was thanked for her time and continued participation in this study. Pt will be randomized on 03-27-24 and begin  intervention on 03-28-24. Pt had no questions  or concern at this time. Pt and family have contact information of research nurse if questions arise.   Delon Pinal BSN RN Clinical Research Nurse [facility] Direct Dial: [phone] 03/13/2024  10:05 AM

## 2024-03-13 NOTE — Progress Notes (Signed)
 Patient Care Team: [name], MD as PCP - General (Family Medicine) [name], MD as Consulting Physician (Endocrinology) [name], MD as Consulting Physician (Plastic Surgery) [name], MD as Consulting Physician (General Surgery) [name], RN as Oncology Nurse Navigator [name], Devere HERO, RN as Oncology Nurse Navigator Odean Potts, MD as Consulting Physician (Hematology and Oncology) [name], MD as Consulting Physician (General Surgery) [name], MD as Consulting Physician (Radiation Oncology)  DIAGNOSIS:  Encounter Diagnosis  Name Primary?   Malignant neoplasm of upper-outer quadrant of right breast in female, estrogen receptor positive (HCC) Yes    SUMMARY OF ONCOLOGIC HISTORY: Oncology History  Malignant neoplasm of upper-outer quadrant of right breast in female, estrogen receptor positive (HCC)  12/09/2023 Initial Diagnosis   Palpable mass in the right breast measured 1.6 cm by mammogram and ultrasound.  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, margins negative, 1/3 lymph node with micrometastatic disease, anterior margin excision: Focus of IDC grade 2 with DCIS final margins negative   12/29/2023 Cancer Staging   Staging form: Breast, AJCC 8th Edition - Pathologic stage from 12/29/2023: Stage IB (pT2, pN31mi(sn), cM0, G2, ER+, PR+, HER2-) - Signed by [name], PA-C on 12/29/2023 Stage prefix: Initial diagnosis Method of lymph node assessment: Sentinel lymph node biopsy Histologic grading system: 3 grade system   01/25/2024 Genetic Testing   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 The report date is 01/25/2024.  The CancerNext-Expanded gene panel offered by [facility] and includes sequencing, rearrangement, and RNA analysis for the  following 77 genes: AIP, ALK, APC, ATM, AXIN2, BAP1, BARD1, BMPR1A, BRCA1, BRCA2, BRIP1, CDC73, CDH1, CDK4, CDKN1B, CDKN2A, CEBPA, CHEK2, CTNNA1, DDX41, DICER1, ETV6, FH, FLCN, GATA2, LZTR1, MAX, MBD4, MEN1, MET, MLH1, MSH2, MSH3, MSH6, MUTYH, NF1, NF2, NTHL1, PALB2, PHOX2B, PMS2, POT1, PRKAR1A, PTCH1, PTEN, RAD51C, RAD51D, RB1, RET, RPS20, RUNX1, SDHA, SDHAF2, SDHB, SDHC, SDHD, SMAD4, SMARCA4, SMARCB1, SMARCE1, STK11, SUFU, TMEM127, TP53, TSC1, TSC2, VHL, and WT1 (sequencing and deletion/duplication); EGFR, HOXB13, KIT, MITF, PDGFRA, POLD1, and POLE (sequencing only); EPCAM and GREM1 (deletion/duplication only).    01/28/2024 -  Chemotherapy   Patient is on Treatment Plan : BREAST DOSE DENSE AC q14d / PACLitaxel q7d       CHIEF COMPLIANT: Cycle 4 Adriamycin  and Cytoxan   HISTORY OF PRESENT ILLNESS:  History of Present Illness Melinda Forbes Melinda Forbes is a 58 year old female undergoing chemotherapy who presents for follow-up regarding her treatment and lab results.  Over the past week she has had persistent nausea. She controls this with CBD oil, Zofran , and an antihistamine. She is eager to complete her current chemotherapy regimen.  Her platelet count has improved to 146,000 from previous lower levels. She noticed purple discoloration under her fingernails, which raised concern for low platelets. Her white blood cell count is 34,000.  She is taking steroids with chemotherapy and has stomach discomfort and frequent burping. She plans to stop steroids in 2 days and stop Zofran  and the antihistamine after this week.  Her hemoglobin has decreased from 11.4 to 10.8. She is scheduled to receive a bone shot, which her daughter will administer.  She is relieved she has not needed pain injections and has not had severe pain with treatment.     ALLERGIES:  is allergic to codeine, morphine  sulfate,  and other.  MEDICATIONS:  Current Outpatient Medications  Medication Sig Dispense Refill    ALPRAZolam (XANAX) 0.5 MG tablet Take 0.5 mg by mouth at bedtime as needed for anxiety.     busPIRone HCl (BUSPAR PO) Take by mouth.     Calcium Carbonate-Vit D-Min (CALCIUM 1200 PO) Take 1 tablet by mouth at bedtime.      Cranberry 125 MG TABS Take by mouth.     dexamethasone  (DECADRON ) 4 MG tablet Take 1 tablet day after chemo and 1 tablet 2 days after chemo with food 30 tablet 1   docusate sodium (COLACE) 100 MG capsule Take 100 mg by mouth 2 (two) times daily.     esomeprazole  (NEXIUM ) 40 MG capsule Take 1 capsule (40 mg total) by mouth daily at 12 noon. 90 capsule 1   famotidine  (PEPCID ) 20 MG tablet Take 20 mg by mouth at bedtime.     fluticasone  (FLONASE ) 50 MCG/ACT nasal spray Place 2 sprays into both nostrils daily. 16 g 10   levothyroxine  (SYNTHROID ) 88 MCG tablet Take 1 tablet (88 mcg total) by mouth daily before breakfast.     lidocaine -prilocaine  (EMLA ) cream Apply to affected area once 30 g 3   Multiple Vitamin (MULTIVITAMIN) tablet Take 1 tablet by mouth daily.     Naproxen Sod-diphenhydrAMINE  (ALEVE PM PO) Take 80 mg by mouth daily.     ondansetron  (ZOFRAN ) 8 MG tablet TAKE 1 TAB BY MOUTH EVERY 8 HRS AS NEEDED FOR NAUSEA/VOMITING. START THIRD DAY AFTER DOXORUBICIN /CYCLOPHOSPHAMIDE  CHEMOTHERAPY. 30 tablet 1   prochlorperazine  (COMPAZINE ) 10 MG tablet Take 1 tablet (10 mg total) by mouth every 6 (six) hours as needed for nausea or vomiting. 30 tablet 1   promethazine  (PHENERGAN ) 12.5 MG tablet Take 1 tablet (12.5 mg total) by mouth every 6 (six) hours as needed for nausea or vomiting. 30 tablet 1   Psyllium Fiber 0.52 g CAPS Take 0.52 g by mouth daily.     RESTASIS 0.05 % ophthalmic emulsion Place into both eyes.     sucralfate  (CARAFATE ) 1 g tablet Take 1 tablet (1 g total) by mouth 4 (four) times daily -  with meals and at bedtime. 120 tablet 1   Zinc 50 MG TABS Take by mouth.     No current facility-administered medications for this visit.    PHYSICAL EXAMINATION: ECOG  PERFORMANCE STATUS: 1 - Symptomatic but completely ambulatory  Vitals:   03/13/24 1013  BP: 116/70  Pulse: 69  Resp: 17  Temp: 97.6 F (36.4 C)  SpO2: 99%   Filed Weights   03/13/24 1013  Weight: 126 lb 8 oz (57.4 kg)      LABORATORY DATA:  I have reviewed the data as listed    Latest Ref Rng & Units 02/29/2024    9:40 AM 02/15/2024    9:03 AM 01/28/2024   11:20 AM  CMP  Glucose 70 - 99 mg/dL 892  882  820   BUN 6 - 20 mg/dL 12  16  13    Creatinine 0.44 - 1.00 mg/dL 9.50  9.38  9.41   Sodium 135 - 145 mmol/L 139  140  139   Potassium 3.5 - 5.1 mmol/L 3.7  3.6  3.2   Chloride 98 - 111 mmol/L 106  105  106   CO2 22 - 32 mmol/L 27  28  27    Calcium 8.9 - 10.3 mg/dL 8.9  9.3  9.2   Total Protein 6.5 - 8.1 g/dL 6.3  6.8  6.7   Total Bilirubin 0.0 - 1.2 mg/dL 0.3  0.3  0.5   Alkaline Phos 38 - 126 U/L 132  108  65   AST 15 - 41 U/L 26  32  17   ALT 0 - 44 U/L 19  26  8      Lab Results  Component Value Date   WBC 34.4 (H) 03/13/2024   HGB 10.8 (L) 03/13/2024   HCT 32.1 (L) 03/13/2024   MCV 89.7 03/13/2024   PLT 146 (L) 03/13/2024   NEUTROABS PENDING 03/13/2024    ASSESSMENT & PLAN:  Malignant neoplasm of upper-outer quadrant of right breast in female, estrogen receptor positive (HCC) 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, anterior margin excision: Focus of IDC grade 2 with DCIS final margins negative  Oncotype DX: Recurrence score 31 (18% risk of recurrence)   01/05/2024: CT CAP and bone scan: Simple cysts in the liver largest 1.4 cm, several sub-5 mm ill-defined foci too small to characterize, few prominent right axillary lymph nodes, seroma right axilla 4.2 cm, ER 100%, PR 10%, HER2 1+ negative, Ki67 20%   Treatment plan: Adjuvant chemotherapy with dose dense Adriamycin  and Cytoxan  x 4 followed by Taxol x 12 Adjuvant radiation therapy Antiestrogen  therapy ----------------------------------------------------------------------------------------------------------------------------------------- Current treatment: Cycle 4 dose dense Adriamycin  and Cytoxan  Chemo toxicities: Chemo-induced nausea and vomiting: She continued to have nausea and vomiting in spite of lowering the dosage.  She will try taking alprazolam during the daytime to reduce the nausea for those 2 days. Fatigue Thrombocytopenia: We will need to make sure that it is not a false reading.  Will recheck her CBC.  But it is fine to proceed with today's treatment. Mild anemia secondary to chemotherapy: Being watched and monitored   Neulasta  approval issues: Patient is going to receive it at her home Return to clinic in 2 weeks for cycle 1 of Taxol ------------------------------------- Assessment and Plan Assessment & Plan Estrogen receptor positive malignant neoplasm of the upper-outer quadrant of the right breast Undergoing chemotherapy with Adriamycin  and Cytoxan . Platelet count improved to 146,000. White blood cell count elevated at 34,000. Hemoglobin decreased to 10.8, expected to improve after the third or fourth cycle of Taxol. Experiencing nausea, managed with antiemetics. Steroids causing gastrointestinal discomfort. - Proceed with chemotherapy regimen as planned. - Administer bone shot 24 hours after chemotherapy completion. - Discontinue steroids after the next two days. - Continue antiemetics as needed for nausea management.  Chemotherapy-induced nausea Nausea persists, managed with antiemetics. Steroids cause gastrointestinal discomfort. - Continue antiemetics as needed for nausea management. - Discontinue steroids after the next two days.  Chemotherapy-induced thrombocytopenia Platelet count improved to 146,000.  Chemotherapy-induced anemia Hemoglobin decreased to 10.8, expected to improve after the third or fourth cycle of Taxol. - Continue to monitor  hemoglobin levels regularly.      No orders of the defined types were placed in this encounter.  The patient has a good understanding of the overall plan. she agrees with it. she will call with any problems that may develop before the next visit here.  I personally spent a total of 30 minutes in the care of the patient today including preparing to see the patient, getting/reviewing separately obtained history, performing a medically appropriate exam/evaluation, counseling and educating, placing orders, referring and communicating with other health care professionals, documenting clinical information in the EHR, independently interpreting results, communicating results, and coordinating care.  Viinay K Karrine Kluttz, MD 03/13/24

## 2024-03-13 NOTE — Assessment & Plan Note (Signed)
 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, anterior margin excision: Focus of IDC grade 2 with DCIS final margins negative  Oncotype DX: Recurrence score 31 (18% risk of recurrence)   01/05/2024: CT CAP and bone scan: Simple cysts in the liver largest 1.4 cm, several sub-5 mm ill-defined foci too small to characterize, few prominent right axillary lymph nodes, seroma right axilla 4.2 cm, ER 100%, PR 10%, HER2 1+ negative, Ki67 20%   Treatment plan: Adjuvant chemotherapy with dose dense Adriamycin  and Cytoxan  x 4 followed by Taxol x 12 Adjuvant radiation therapy Antiestrogen therapy ----------------------------------------------------------------------------------------------------------------------------------------- Current treatment: Cycle 4 dose dense Adriamycin  and Cytoxan  Chemo toxicities: Chemo-induced nausea and vomiting: She continued to have nausea and vomiting in spite of lowering the dosage.  She will try taking alprazolam during the daytime to reduce the nausea for those 2 days. Fatigue Thrombocytopenia: We will need to make sure that it is not a false reading.  Will recheck her CBC.  But it is fine to proceed with today's treatment. Mild anemia secondary to chemotherapy: Being watched and monitored   Neulasta  approval issues: Approved to be given in the hospital Return to clinic in 2 weeks for cycle 1 of Taxol

## 2024-03-15 ENCOUNTER — Telehealth: Payer: Self-pay | Admitting: Licensed Clinical Social Worker

## 2024-03-16 ENCOUNTER — Inpatient Hospital Stay

## 2024-03-17 ENCOUNTER — Encounter: Payer: Self-pay | Admitting: Hematology and Oncology

## 2024-03-20 ENCOUNTER — Ambulatory Visit

## 2024-03-23 ENCOUNTER — Other Ambulatory Visit: Payer: Self-pay

## 2024-03-23 DIAGNOSIS — C50411 Malignant neoplasm of upper-outer quadrant of right female breast: Secondary | ICD-10-CM

## 2024-03-27 DIAGNOSIS — Z170 Estrogen receptor positive status [ER+]: Secondary | ICD-10-CM

## 2024-03-27 NOTE — Research (Signed)
 S2205, ICE COMPRESS: RANDOMIZED TRIAL OF LIMB CRYOCOMPRESSION VERSUS CONTINUOUS COMPRESSION VERSUS LOW CYCLIC COMPRESSION FOR THE PREVENTION OF TAXANE-INDUCED PERIPHERAL NEUROPATHY  This Nurse has reviewed this patient's inclusion and exclusion criteria as a second review and confirms Joanmarie Bunton Riederer is eligible for study participation.  Patient may continue with enrollment.  Cherylyn Hoard, BSN, RN, [employer] Research Nurse II [phone] 03/27/2024 11:11 AM

## 2024-03-27 NOTE — Research (Signed)
 D7794, ICE COMPRESS: RANDOMIZED TRIAL OF LIMB CRYOCOMPRESSION VERSUS CONTINUOUS COMPRESSION VERSUS LOW CYCLIC COMPRESSION FOR THE PREVENTION  OF TAXANE-INDUCED PERIPHERAL NEUROPATHY  Research RN called pt this am to verify she has no signs/ symptoms of neuropathy. Pt denies any numbness or tingling in her her feet or hands. Pt also denies any open skin wounds or ulcers of her limbs at this time. Pt will be randomized today for the start of her Taxol  treatment tomorrow.   Delon Pinal BSN RN Clinical Research Nurse [facility] Direct Dial: [phone] 03/27/2024  10:18 AM

## 2024-03-27 NOTE — Research (Signed)
 S2205, ICE COMPRESS: RANDOMIZED TRIAL OF LIMB CRYOCOMPRESSION VERSUS CONTINUOUS COMPRESSION VERSUS LOW CYCLIC COMPRESSION FOR THE PREVENTION  OF TAXANE-INDUCED PERIPHERAL NEUROPATHY   This Nurse has reviewed this patient's inclusion and exclusion criteria and confirmed Melinda Forbes is eligible for study participation.  Patient will continue with enrollment.  Eligibility confirmed by treating investigator, who also agrees that patient should proceed with enrollment.  Randomization:  Pt enrollment was placed in OPEN and pt was randomized to Cryocompression. Pt was called by research staff to let her know the assignment. Pt was also educated on what to wear for treatments with the paxman device. Pt understood and had no questions at this time. Pt has contact information for research staff if needed.   Delon Pinal BSN RN Clinical Research Nurse [facility] Direct Dial: [phone] 03/27/2024  11:41 AM

## 2024-03-28 ENCOUNTER — Inpatient Hospital Stay

## 2024-03-28 ENCOUNTER — Other Ambulatory Visit: Payer: Self-pay | Admitting: *Deleted

## 2024-03-28 ENCOUNTER — Encounter: Payer: Self-pay | Admitting: *Deleted

## 2024-03-28 ENCOUNTER — Inpatient Hospital Stay: Admitting: Hematology and Oncology

## 2024-03-28 VITALS — BP 141/76 | HR 66 | Temp 98.6°F | Resp 17 | Wt 125.6 lb

## 2024-03-28 VITALS — BP 139/87 | HR 77 | Temp 98.2°F | Resp 16

## 2024-03-28 DIAGNOSIS — E039 Hypothyroidism, unspecified: Secondary | ICD-10-CM

## 2024-03-28 DIAGNOSIS — Z170 Estrogen receptor positive status [ER+]: Secondary | ICD-10-CM | POA: Diagnosis not present

## 2024-03-28 DIAGNOSIS — C50411 Malignant neoplasm of upper-outer quadrant of right female breast: Secondary | ICD-10-CM | POA: Diagnosis not present

## 2024-03-28 DIAGNOSIS — Z5111 Encounter for antineoplastic chemotherapy: Secondary | ICD-10-CM | POA: Diagnosis not present

## 2024-03-28 LAB — CBC WITH DIFFERENTIAL (CANCER CENTER ONLY)
Abs Immature Granulocytes: 3.96 K/uL — ABNORMAL HIGH (ref 0.00–0.07)
Basophils Absolute: 0.2 K/uL — ABNORMAL HIGH (ref 0.0–0.1)
Basophils Relative: 1 %
Eosinophils Absolute: 0.1 K/uL (ref 0.0–0.5)
Eosinophils Relative: 1 %
HCT: 30.8 % — ABNORMAL LOW (ref 36.0–46.0)
Hemoglobin: 10.5 g/dL — ABNORMAL LOW (ref 12.0–15.0)
Immature Granulocytes: 17 %
Lymphocytes Relative: 6 %
Lymphs Abs: 1.4 K/uL (ref 0.7–4.0)
MCH: 30.6 pg (ref 26.0–34.0)
MCHC: 34.1 g/dL (ref 30.0–36.0)
MCV: 89.8 fL (ref 80.0–100.0)
Monocytes Absolute: 1.4 K/uL — ABNORMAL HIGH (ref 0.1–1.0)
Monocytes Relative: 6 %
Neutro Abs: 16.7 K/uL — ABNORMAL HIGH (ref 1.7–7.7)
Neutrophils Relative %: 69 %
Platelet Count: 147 K/uL — ABNORMAL LOW (ref 150–400)
RBC: 3.43 MIL/uL — ABNORMAL LOW (ref 3.87–5.11)
RDW: 17.3 % — ABNORMAL HIGH (ref 11.5–15.5)
Smear Review: NORMAL
WBC Count: 23.8 K/uL — ABNORMAL HIGH (ref 4.0–10.5)
nRBC: 0.1 % (ref 0.0–0.2)

## 2024-03-28 LAB — CMP (CANCER CENTER ONLY)
ALT: 10 U/L (ref 0–44)
AST: 21 U/L (ref 15–41)
Albumin: 4.3 g/dL (ref 3.5–5.0)
Alkaline Phosphatase: 131 U/L — ABNORMAL HIGH (ref 38–126)
Anion gap: 9 (ref 5–15)
BUN: 12 mg/dL (ref 6–20)
CO2: 26 mmol/L (ref 22–32)
Calcium: 8.9 mg/dL (ref 8.9–10.3)
Chloride: 104 mmol/L (ref 98–111)
Creatinine: 0.5 mg/dL (ref 0.44–1.00)
GFR, Estimated: >60 mL/min (ref >60)
Glucose, Bld: 107 mg/dL — ABNORMAL HIGH (ref 70–99)
Potassium: 3.6 mmol/L (ref 3.5–5.1)
Sodium: 139 mmol/L (ref 135–145)
Total Bilirubin: 0.3 mg/dL (ref 0.0–1.2)
Total Protein: 6.4 g/dL — ABNORMAL LOW (ref 6.5–8.1)

## 2024-03-28 LAB — RESEARCH LABS

## 2024-03-28 MED ORDER — SODIUM CHLORIDE 0.9 % IV SOLN
80.0000 mg/m2 | Freq: Once | INTRAVENOUS | Status: AC
  Administered 2024-03-28: 13:00:00 126 mg via INTRAVENOUS
  Filled 2024-03-28: qty 21

## 2024-03-28 MED ORDER — DIPHENHYDRAMINE HCL 50 MG/ML IJ SOLN
50.0000 mg | Freq: Once | INTRAMUSCULAR | Status: AC
  Administered 2024-03-28: 11:00:00 50 mg via INTRAVENOUS
  Filled 2024-03-28: qty 1

## 2024-03-28 MED ORDER — FAMOTIDINE IN NACL 20-0.9 MG/50ML-% IV SOLN
20.0000 mg | Freq: Once | INTRAVENOUS | Status: AC
  Administered 2024-03-28: 11:00:00 20 mg via INTRAVENOUS
  Filled 2024-03-28: qty 50

## 2024-03-28 MED ORDER — SODIUM CHLORIDE 0.9 % IV SOLN: INTRAVENOUS | Status: DC

## 2024-03-28 MED ORDER — SODIUM CHLORIDE 0.9 % IV SOLN
10.0000 mg | Freq: Once | INTRAVENOUS | Status: AC
  Administered 2024-03-28: 11:00:00 10 mg via INTRAVENOUS
  Filled 2024-03-28: qty 10

## 2024-03-28 NOTE — Progress Notes (Signed)
 Patient Care Team: [name], MD as PCP - General (Family Medicine) [name], MD as Consulting Physician (Endocrinology) [name], MD as Consulting Physician (Plastic Surgery) [name], MD as Consulting Physician (General Surgery) [name], RN as Oncology Nurse Navigator [name], Devere HERO, RN as Oncology Nurse Navigator Odean Potts, MD as Consulting Physician (Hematology and Oncology) [name], MD as Consulting Physician (General Surgery) [name], MD as Consulting Physician (Radiation Oncology)  DIAGNOSIS:  Encounter Diagnosis  Name Primary?   Malignant neoplasm of upper-outer quadrant of right breast in female, estrogen receptor positive (HCC) Yes    SUMMARY OF ONCOLOGIC HISTORY: Oncology History  Malignant neoplasm of upper-outer quadrant of right breast in female, estrogen receptor positive (HCC)  12/09/2023 Initial Diagnosis   Palpable mass in the right breast measured 1.6 cm by mammogram and ultrasound.  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, margins negative, 1/3 lymph node with micrometastatic disease, anterior margin excision: Focus of IDC grade 2 with DCIS final margins negative   12/29/2023 Cancer Staging   Staging form: Breast, AJCC 8th Edition - Pathologic stage from 12/29/2023: Stage IB (pT2, pN59mi(sn), cM0, G2, ER+, PR+, HER2-) - Signed by [name], PA-C on 12/29/2023 Stage prefix: Initial diagnosis Method of lymph node assessment: Sentinel lymph node biopsy Histologic grading system: 3 grade system   01/25/2024 Genetic Testing   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 The report date is 01/25/2024.  The CancerNext-Expanded gene panel offered by [facility] and includes sequencing, rearrangement, and RNA analysis for the  following 77 genes: AIP, ALK, APC, ATM, AXIN2, BAP1, BARD1, BMPR1A, BRCA1, BRCA2, BRIP1, CDC73, CDH1, CDK4, CDKN1B, CDKN2A, CEBPA, CHEK2, CTNNA1, DDX41, DICER1, ETV6, FH, FLCN, GATA2, LZTR1, MAX, MBD4, MEN1, MET, MLH1, MSH2, MSH3, MSH6, MUTYH, NF1, NF2, NTHL1, PALB2, PHOX2B, PMS2, POT1, PRKAR1A, PTCH1, PTEN, RAD51C, RAD51D, RB1, RET, RPS20, RUNX1, SDHA, SDHAF2, SDHB, SDHC, SDHD, SMAD4, SMARCA4, SMARCB1, SMARCE1, STK11, SUFU, TMEM127, TP53, TSC1, TSC2, VHL, and WT1 (sequencing and deletion/duplication); EGFR, HOXB13, KIT, MITF, PDGFRA, POLD1, and POLE (sequencing only); EPCAM and GREM1 (deletion/duplication only).    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  She completed her fourth and final cycle of dose-dense Adriamycin  and Cytoxan  last Monday. She had three days of severe, incapacitating fatigue requiring bed rest, which improved by Saturday night. Fatigue persists but is now milder. She asks how long to expect fatigue as she transitions to paclitaxel .  She denies significant nausea and abnormal bleeding, bruising, or new palpable masses. 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  She is enrolled in a clinical trial using cold therapy to prevent paclitaxel -induced neuropathy. She is anxious about the cold therapy but plans to proceed with support from her daughter. She asks about the likelihood of nausea with paclitaxel  and understands the planned IV steroid, diphenhydramine , and famotidine  premedication. She has mild sedation with  diphenhydramine   but tolerates it.  She is careful about infection control at home and cleans shared bathroom surfaces with disinfectant after use. She asks if these precautions are necessary during paclitaxel  therapy.      ALLERGIES:  is allergic to codeine, morphine  sulfate, and other.  MEDICATIONS:  Current Outpatient Medications  Medication Sig Dispense Refill   ALPRAZolam (XANAX) 0.5 MG tablet Take 0.5 mg by mouth at bedtime as needed for anxiety.     busPIRone HCl (BUSPAR PO) Take by mouth.     Calcium Carbonate-Vit D-Min (CALCIUM 1200 PO) Take 1 tablet by mouth at bedtime.      Cranberry 125 MG TABS Take by mouth.     docusate sodium (COLACE) 100 MG capsule Take 100 mg by mouth 2 (two) times daily.     esomeprazole  (NEXIUM ) 40 MG capsule Take 1 capsule (40 mg total) by mouth daily at 12 noon. 90 capsule 1   famotidine  (PEPCID ) 20 MG tablet Take 20 mg by mouth at bedtime.     fluticasone  (FLONASE ) 50 MCG/ACT nasal spray Place 2 sprays into both nostrils daily. 16 g 10   levothyroxine  (SYNTHROID ) 88 MCG tablet Take 1 tablet (88 mcg total) by mouth daily before breakfast.     lidocaine -prilocaine  (EMLA ) cream Apply to affected area once 30 g 3   Multiple Vitamin (MULTIVITAMIN) tablet Take 1 tablet by mouth daily.     Naproxen Sod-diphenhydrAMINE  (ALEVE PM PO) Take 80 mg by mouth daily.     ondansetron  (ZOFRAN ) 8 MG tablet TAKE 1 TAB BY MOUTH EVERY 8 HRS AS NEEDED FOR NAUSEA/VOMITING. START THIRD DAY AFTER DOXORUBICIN /CYCLOPHOSPHAMIDE  CHEMOTHERAPY. 30 tablet 1   prochlorperazine  (COMPAZINE ) 10 MG tablet Take 1 tablet (10 mg total) by mouth every 6 (six) hours as needed for nausea or vomiting. 30 tablet 1   promethazine  (PHENERGAN ) 12.5 MG tablet Take 1 tablet (12.5 mg total) by mouth every 6 (six) hours as needed for nausea or vomiting. 30 tablet 1   Psyllium Fiber 0.52 g CAPS Take 0.52 g by mouth daily.     RESTASIS 0.05 % ophthalmic emulsion Place into both eyes.     sucralfate   (CARAFATE ) 1 g tablet Take 1 tablet (1 g total) by mouth 4 (four) times daily -  with meals and at bedtime. 120 tablet 1   Zinc 50 MG TABS Take by mouth.     No current facility-administered medications for this visit.    PHYSICAL EXAMINATION: ECOG PERFORMANCE STATUS: 1 - Symptomatic but completely ambulatory  Vitals:   03/28/24 1001  BP: (!) 141/76  Pulse: 66  Resp: 17  Temp: 98.6 F (37 C)  SpO2: 100%   Filed Weights   03/28/24 1001  Weight: 125 lb 9.6 oz (57 kg)    LABORATORY DATA:  I have reviewed the data as listed    Latest Ref Rng & Units 03/28/2024    9:33 AM 03/13/2024    9:51 AM 02/29/2024    9:40 AM  CMP  Glucose 70 - 99 mg/dL 892  98  892   BUN 6 - 20 mg/dL 12  8  12    Creatinine 0.44 - 1.00 mg/dL 9.49  9.53  9.50   Sodium 135 - 145 mmol/L 139  140  139   Potassium 3.5 - 5.1 mmol/L 3.6  4.0  3.7   Chloride 98 - 111 mmol/L 104  105  106   CO2 22 - 32 mmol/L 26  26  27    Calcium 8.9 -  10.3 mg/dL 8.9  9.0  8.9   Total Protein 6.5 - 8.1 g/dL 6.4  6.6  6.3   Total Bilirubin 0.0 - 1.2 mg/dL 0.3  0.2  0.3   Alkaline Phos 38 - 126 U/L 131  167  132   AST 15 - 41 U/L 21  26  26    ALT 0 - 44 U/L 10  17  19      Lab Results  Component Value Date   WBC 23.8 (H) 03/28/2024   HGB 10.5 (L) 03/28/2024   HCT 30.8 (L) 03/28/2024   MCV 89.8 03/28/2024   PLT 147 (L) 03/28/2024   NEUTROABS 16.7 (H) 03/28/2024    ASSESSMENT & PLAN:  Malignant neoplasm of upper-outer quadrant of right breast in female, estrogen receptor positive (HCC)   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, anterior margin excision: Focus of IDC grade 2 with DCIS final margins negative  Oncotype DX: Recurrence score 31 (18% risk of recurrence)   01/05/2024: CT CAP and bone scan: Simple cysts in the liver largest 1.4 cm, several sub-5 mm ill-defined foci too small to characterize, few prominent right  axillary lymph nodes, seroma right axilla 4.2 cm, ER 100%, PR 10%, HER2 1+ negative, Ki67 20%   Treatment plan: Adjuvant chemotherapy with dose dense Adriamycin  and Cytoxan  x 4 followed by Taxol  x 12 Adjuvant radiation therapy Antiestrogen therapy ----------------------------------------------------------------------------------------------------------------------------------------- Current treatment: Completed 4 cycles of dose dense Adriamycin  and Cytoxan , today cycle 1 Taxol  Chemo toxicities: Chemo-induced nausea and vomiting: She continued to have nausea and vomiting in spite of lowering the dosage.  She will try taking alprazolam during the daytime to reduce the nausea for those 2 days. Fatigue Thrombocytopenia: Recovered Mild anemia secondary to chemotherapy: Being watched and monitored  Return to clinic weekly for Taxol  treatments ------------------------------------- Assessment and Plan Assessment & Plan Estrogen receptor positive malignant neoplasm of upper-outer quadrant of right breast Transitioned to weekly paclitaxel . Enrolled in cold therapy trial for neuropathy mitigation. Ongoing surveillance planned. - Initiated adjuvant weekly paclitaxel . - Premedicated with corticosteroids, diphenhydramine , and famotidine ; discussed alternative non-Cremophor agent if hypersensitivity occurs. - Continued cold therapy trial participation. - Provided infection control guidance and reinforced hygiene. - Scheduled follow-up every 2 weeks during chemotherapy, alternating infusion-only and provider visits.  Chemotherapy-induced anemia Hemoglobin stable with minimal decline during doxorubicin  and cyclophosphamide . No acute concerns.  Chemotherapy-induced thrombocytopenia Platelet count recovered post-doxorubicin  and cyclophosphamide .  Chemotherapy-induced fatigue Fatigue post-doxorubicin  and cyclophosphamide  with lingering effects. Expected gradual improvement. Fatigue may recur with  radiation therapy. - Provided guidance on fatigue course and improvement. - Discussed fatigue persistence post-chemotherapy and potential recurrence with radiation.      No orders of the defined types were placed in this encounter.  The patient has a good understanding of the overall plan. she agrees with it. she will call with any problems that may develop before the next visit here.  I personally spent a total of 30 minutes in the care of the patient today including preparing to see the patient, getting/reviewing separately obtained history, performing a medically appropriate exam/evaluation, counseling and educating, placing orders, referring and communicating with other health care professionals, documenting clinical information in the EHR, independently interpreting results, communicating results, and coordinating care.   Viinay K Aimee Timmons, MD 03/28/2024

## 2024-03-28 NOTE — Research (Unsigned)
 D7794, ICE COMPRESS: RANDOMIZED TRIAL OF LIMB CRYOCOMPRESSION VERSUS CONTINUOUS COMPRESSION VERSUS LOW CYCLIC COMPRESSION FOR THE PREVENTION OF TAXANE-INDUCED PERIPHERAL NEUROPATHY    Patient arrives today accompanied by husband [name] for the week 1 treatment. Confirmed patient does not have wounds, sores, or lesions to extremities. Patient has not had any vaccinations since last visit.   LABS: Optional labs are collected via port per consent and study protocol: Patient Melinda Forbes tolerated well without complaint.   MD/PROVIDER VISIT: Patient sees Dr.Gudena for today's visit.   ADVERSE EVENTS: Solicited AEs reviewed with patient.  She denies any sensory or motor neuropathy symptoms. See AE table below   Baseline AEs Adverse Event CTCAE Grade Onset date Resolved date Relationship to Study Intervention Action Taken Comments  Skin atrophy (solicited) 0            Skin hyperpigmentation (solicited) 0            Skin hypopigmentation (solicited) 0            Skin induration (solicited) 0            Skin ulceration (solicited) 0            Rash maculopapular (solicited) 0            Nail changes (solicited) 0            Cold intolerance (solicited) (general disorders and administration site conditions- other) 0            Frostbite (solicited) (skin and subcutaneous tissue disorders- other) 0                STUDY INTERVENTION & TOLERABILITY ASSESSMENTS: 11:42 am am Device wraps applied; pre-treatment started set on Arm 1 CyroCompression with temperature setting at 11 degree celsius and cyclic pressure 5-15 mm Hg.  11:52 pm Required 5-15 minute Tolerability check- pt states intolerable to cyrocompressionwith temperature setting at 11 degree celsius and cyclic pressure 5-15 mm Hg. Temperature setting increased to 12 degree.  12:13 pm Bathroom break. Wraps removed.   After using the bathroom pt stated she did not want to use the wraps anymore. She stated it's too cold on her arms and legs and  she can not tolerate it even if the temperature was increased to 13 degree celsius. Offered for pt to stop today and try again next week. She did not want to do that.  She does not want to use the Paxman device for the rest of the study but is willing to do PROs and assessments for the study.   The patient was thanked for their time and continued voluntary participation in this study.  Patient Melinda Forbes has been provided direct contact information and is encouraged to contact this Nurse for any needs or question.   Mazie Larsen, RN, BSN Clinical Research Nurse [phone] 03/28/2024

## 2024-03-28 NOTE — Assessment & Plan Note (Signed)
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, anterior margin excision: Focus of IDC grade 2 with DCIS final margins negative  Oncotype DX: Recurrence score 31 (18% risk of recurrence)   01/05/2024: CT CAP and bone scan: Simple cysts in the liver largest 1.4 cm, several sub-5 mm ill-defined foci too small to characterize, few prominent right axillary lymph nodes, seroma right axilla 4.2 cm, ER 100%, PR 10%, HER2 1+ negative, Ki67 20%   Treatment plan: Adjuvant chemotherapy with dose dense Adriamycin  and Cytoxan  x 4 followed by Taxol  x 12 Adjuvant radiation therapy Antiestrogen therapy ----------------------------------------------------------------------------------------------------------------------------------------- Current treatment: Completed 4 cycles of dose dense Adriamycin  and Cytoxan , today cycle 1 Taxol  Chemo toxicities: Chemo-induced nausea and vomiting: She continued to have nausea and vomiting in spite of lowering the dosage.  She will try taking alprazolam during the daytime to reduce the nausea for those 2 days. Fatigue Thrombocytopenia: Recovered Mild anemia secondary to chemotherapy: Being watched and monitored  Return to clinic weekly for Taxol  treatments

## 2024-03-28 NOTE — Patient Instructions (Signed)
 [facility] - A DEPT OF [facility]  Discharge Instructions: Thank you for choosing [facility] to provide your oncology and hematology care.   If you have a lab appointment with the Cancer Center, please go directly to the Cancer Center and check in at the registration area.   Wear comfortable clothing and clothing appropriate for easy access to any Portacath or PICC line.   We strive to give you quality time with your provider. You may need to reschedule your appointment if you arrive late (15 or more minutes).  Arriving late affects you and other patients whose appointments are after yours.  Also, if you miss three or more appointments without notifying the office, you may be dismissed from the clinic at the provider's discretion.      For prescription refill requests, have your pharmacy contact our office and allow 72 hours for refills to be completed.    Today you received the following chemotherapy and/or immunotherapy agents: Paclitaxel      To help prevent nausea and vomiting after your treatment, we encourage you to take your nausea medication as directed.  BELOW ARE SYMPTOMS THAT SHOULD BE REPORTED IMMEDIATELY: *FEVER GREATER THAN 100.4 F (38 C) OR HIGHER *CHILLS OR SWEATING *NAUSEA AND VOMITING THAT IS NOT CONTROLLED WITH YOUR NAUSEA MEDICATION *UNUSUAL SHORTNESS OF BREATH *UNUSUAL BRUISING OR BLEEDING *URINARY PROBLEMS (pain or burning when urinating, or frequent urination) *BOWEL PROBLEMS (unusual diarrhea, constipation, pain near the anus) TENDERNESS IN MOUTH AND THROAT WITH OR WITHOUT PRESENCE OF ULCERS (sore throat, sores in mouth, or a toothache) UNUSUAL RASH, SWELLING OR PAIN  UNUSUAL VAGINAL DISCHARGE OR ITCHING   Items with * indicate a potential emergency and should be followed up as soon as possible or go to the Emergency Department if any problems should occur.  Please show the CHEMOTHERAPY ALERT CARD or IMMUNOTHERAPY  ALERT CARD at check-in to the Emergency Department and triage nurse.  Should you have questions after your visit or need to cancel or reschedule your appointment, please contact [facility] - A DEPT OF [facility]  Dept: [phone]  and follow the prompts.  Office hours are 8:00 a.m. to 4:30 p.m. Monday - Friday. Please note that voicemails left after 4:00 p.m. may not be returned until the following business day.  We are closed weekends and major holidays. You have access to a nurse at all times for urgent questions. Please call the main number to the clinic Dept: [phone] and follow the prompts.   For any non-urgent questions, you may also contact your provider using MyChart. We now offer e-Visits for anyone 37 and older to request care online for non-urgent symptoms. For details visit [email or web address].   Also download the MyChart app! Go to the app store, search "MyChart", open the app, select [facility], and log in with your MyChart username and password.  Paclitaxel Injection What is this medication? PACLITAXEL (PAK li TAX el) treats some types of cancer. It works by slowing down the growth of cancer cells. This medicine may be used for other purposes; ask your health care provider or pharmacist if you have questions. COMMON BRAND NAME(S): Onxol, Taxol What should I tell my care team before I take this medication? They need to know if you have any of these conditions: Heart disease Liver disease Low white blood cell levels An unusual or allergic reaction to paclitaxel, other medications, foods,  dyes, or preservatives If you or your partner are pregnant or trying to get pregnant Breast-feeding How should I use this medication? This medication is injected into a vein. It is given by your care team in a hospital or clinic setting. Talk to your care team about the use of this medication in children. While it may be given to children for selected  conditions, precautions do apply. Overdosage: If you think you have taken too much of this medicine contact a poison control center or emergency room at once. NOTE: This medicine is only for you. Do not share this medicine with others. What if I miss a dose? Keep appointments for follow-up doses. It is important not to miss your dose. Call your care team if you are unable to keep an appointment. What may interact with this medication? Do not take this medication with any of the following: Live virus vaccines Other medications may affect the way this medication works. Talk with your care team about all of the medications you take. They may suggest changes to your treatment plan to lower the risk of side effects and to make sure your medications work as intended. This list may not describe all possible interactions. Give your health care provider a list of all the medicines, herbs, non-prescription drugs, or dietary supplements you use. Also tell them if you smoke, drink alcohol, or use illegal drugs. Some items may interact with your medicine. What should I watch for while using this medication? Your condition will be monitored carefully while you are receiving this medication. You may need blood work while taking this medication. This medication may make you feel generally unwell. This is not uncommon as chemotherapy can affect healthy cells as well as cancer cells. Report any side effects. Continue your course of treatment even though you feel ill unless your care team tells you to stop. This medication can cause serious allergic reactions. To reduce the risk, your care team may give you other medications to take before receiving this one. Be sure to follow the directions from your care team. This medication may increase your risk of getting an infection. Call your care team for advice if you get a fever, chills, sore throat, or other symptoms of a cold or flu. Do not treat yourself. Try to avoid  being around people who are sick. This medication may increase your risk to bruise or bleed. Call your care team if you notice any unusual bleeding. Be careful brushing or flossing your teeth or using a toothpick because you may get an infection or bleed more easily. If you have any dental work done, tell your dentist you are receiving this medication. Talk to your care team if you may be pregnant. Serious birth defects can occur if you take this medication during pregnancy. Talk to your care team before breastfeeding. Changes to your treatment plan may be needed. What side effects may I notice from receiving this medication? Side effects that you should report to your care team as soon as possible: Allergic reactions--skin rash, itching, hives, swelling of the face, lips, tongue, or throat Heart rhythm changes--fast or irregular heartbeat, dizziness, feeling faint or lightheaded, chest pain, trouble breathing Increase in blood pressure Infection--fever, chills, cough, sore throat, wounds that don't heal, pain or trouble when passing urine, general feeling of discomfort or being unwell Low blood pressure--dizziness, feeling faint or lightheaded, blurry vision Low red blood cell level--unusual weakness or fatigue, dizziness, headache, trouble breathing Painful swelling, warmth, or redness of  the skin, blisters or sores at the infusion site Pain, tingling, or numbness in the hands or feet Slow heartbeat--dizziness, feeling faint or lightheaded, confusion, trouble breathing, unusual weakness or fatigue Unusual bruising or bleeding Side effects that usually do not require medical attention (report to your care team if they continue or are bothersome): Diarrhea Hair loss Joint pain Loss of appetite Muscle pain Nausea Vomiting This list may not describe all possible side effects. Call your doctor for medical advice about side effects. You may report side effects to FDA at 1-800-FDA-1088. Where  should I keep my medication? This medication is given in a hospital or clinic. It will not be stored at home. NOTE: This sheet is a summary. It may not cover all possible information. If you have questions about this medicine, talk to your doctor, pharmacist, or health care provider.  2024 Elsevier/Gold Standard (2021-08-19 00:00:00)

## 2024-03-29 ENCOUNTER — Encounter: Payer: Self-pay | Admitting: Hematology and Oncology

## 2024-03-29 LAB — THYROID PANEL WITH TSH
Free Thyroxine Index: 2 (ref 1.2–4.9)
T3 Uptake Ratio: 24 % (ref 24–39)
T4, Total: 8.3 ug/dL (ref 4.5–12.0)
TSH: 5.58 u[IU]/mL — ABNORMAL HIGH (ref 0.450–4.500)

## 2024-04-03 MED FILL — Dexamethasone Sodium Phosphate Inj 100 MG/10ML: INTRAMUSCULAR | Qty: 1 | Status: AC

## 2024-04-04 ENCOUNTER — Inpatient Hospital Stay

## 2024-04-04 ENCOUNTER — Inpatient Hospital Stay (HOSPITAL_BASED_OUTPATIENT_CLINIC_OR_DEPARTMENT_OTHER): Admitting: Hematology and Oncology

## 2024-04-04 ENCOUNTER — Other Ambulatory Visit: Payer: Self-pay

## 2024-04-04 VITALS — BP 120/84 | HR 86 | Temp 99.3°F | Resp 18

## 2024-04-04 VITALS — BP 120/80 | HR 71 | Temp 97.9°F | Resp 18 | Ht 63.0 in | Wt 123.9 lb

## 2024-04-04 DIAGNOSIS — C50411 Malignant neoplasm of upper-outer quadrant of right female breast: Secondary | ICD-10-CM | POA: Diagnosis not present

## 2024-04-04 DIAGNOSIS — Z170 Estrogen receptor positive status [ER+]: Secondary | ICD-10-CM

## 2024-04-04 DIAGNOSIS — Z5111 Encounter for antineoplastic chemotherapy: Secondary | ICD-10-CM | POA: Diagnosis not present

## 2024-04-04 LAB — CBC WITH DIFFERENTIAL (CANCER CENTER ONLY)
Abs Immature Granulocytes: 0.11 K/uL — ABNORMAL HIGH (ref 0.00–0.07)
Basophils Absolute: 0.1 K/uL (ref 0.0–0.1)
Basophils Relative: 1 %
Eosinophils Absolute: 0.1 K/uL (ref 0.0–0.5)
Eosinophils Relative: 1 %
HCT: 29.4 % — ABNORMAL LOW (ref 36.0–46.0)
Hemoglobin: 10 g/dL — ABNORMAL LOW (ref 12.0–15.0)
Immature Granulocytes: 2 %
Lymphocytes Relative: 15 %
Lymphs Abs: 1 K/uL (ref 0.7–4.0)
MCH: 30.6 pg (ref 26.0–34.0)
MCHC: 34 g/dL (ref 30.0–36.0)
MCV: 89.9 fL (ref 80.0–100.0)
Monocytes Absolute: 0.7 K/uL (ref 0.1–1.0)
Monocytes Relative: 11 %
Neutro Abs: 4.8 K/uL (ref 1.7–7.7)
Neutrophils Relative %: 70 %
Platelet Count: 299 K/uL (ref 150–400)
RBC: 3.27 MIL/uL — ABNORMAL LOW (ref 3.87–5.11)
RDW: 17.1 % — ABNORMAL HIGH (ref 11.5–15.5)
WBC Count: 6.7 K/uL (ref 4.0–10.5)
nRBC: 0 % (ref 0.0–0.2)

## 2024-04-04 LAB — CMP (CANCER CENTER ONLY)
ALT: 16 U/L (ref 0–44)
AST: 26 U/L (ref 15–41)
Albumin: 4.5 g/dL (ref 3.5–5.0)
Alkaline Phosphatase: 81 U/L (ref 38–126)
Anion gap: 10 (ref 5–15)
BUN: 11 mg/dL (ref 6–20)
CO2: 25 mmol/L (ref 22–32)
Calcium: 8.9 mg/dL (ref 8.9–10.3)
Chloride: 103 mmol/L (ref 98–111)
Creatinine: 0.46 mg/dL (ref 0.44–1.00)
GFR, Estimated: >60 mL/min
Glucose, Bld: 107 mg/dL — ABNORMAL HIGH (ref 70–99)
Potassium: 3.7 mmol/L (ref 3.5–5.1)
Sodium: 138 mmol/L (ref 135–145)
Total Bilirubin: 0.4 mg/dL (ref 0.0–1.2)
Total Protein: 6.5 g/dL (ref 6.5–8.1)

## 2024-04-04 MED ORDER — SODIUM CHLORIDE 0.9 % IV SOLN
10.0000 mg | Freq: Once | INTRAVENOUS | Status: AC
  Administered 2024-04-04: 10 mg via INTRAVENOUS
  Filled 2024-04-04: qty 10

## 2024-04-04 MED ORDER — SODIUM CHLORIDE 0.9 % IV SOLN
80.0000 mg/m2 | Freq: Once | INTRAVENOUS | Status: AC
  Administered 2024-04-04: 126 mg via INTRAVENOUS
  Filled 2024-04-04: qty 21

## 2024-04-04 MED ORDER — FAMOTIDINE IN NACL 20-0.9 MG/50ML-% IV SOLN
20.0000 mg | Freq: Once | INTRAVENOUS | Status: AC
  Administered 2024-04-04: 20 mg via INTRAVENOUS
  Filled 2024-04-04: qty 50

## 2024-04-04 MED ORDER — SODIUM CHLORIDE 0.9 % IV SOLN: INTRAVENOUS | Status: DC

## 2024-04-04 MED ORDER — DIPHENHYDRAMINE HCL 50 MG/ML IJ SOLN
50.0000 mg | Freq: Once | INTRAMUSCULAR | Status: AC
  Administered 2024-04-04: 50 mg via INTRAVENOUS
  Filled 2024-04-04: qty 1

## 2024-04-04 NOTE — Progress Notes (Signed)
 "  Patient Care Team: [name], MD as PCP - General (Family Medicine) [name], MD as Consulting Physician (Endocrinology) [name], MD as Consulting Physician (Plastic Surgery) [name], MD as Consulting Physician (General Surgery) [name], RN as Oncology Nurse Navigator [name], Devere HERO, RN as Oncology Nurse Navigator Odean Potts, MD as Consulting Physician (Hematology and Oncology) [name], MD as Consulting Physician (General Surgery) [name], MD as Consulting Physician (Radiation Oncology)  DIAGNOSIS:  Encounter Diagnosis  Name Primary?   Malignant neoplasm of upper-outer quadrant of right breast in female, estrogen receptor positive (HCC) Yes    SUMMARY OF ONCOLOGIC HISTORY: Oncology History  Malignant neoplasm of upper-outer quadrant of right breast in female, estrogen receptor positive (HCC)  12/09/2023 Initial Diagnosis   Palpable mass in the right breast measured 1.6 cm by mammogram and ultrasound.  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, margins negative, 1/3 lymph node with micrometastatic disease, anterior margin excision: Focus of IDC grade 2 with DCIS final margins negative   12/29/2023 Cancer Staging   Staging form: Breast, AJCC 8th Edition - Pathologic stage from 12/29/2023: Stage IB (pT2, pN50mi(sn), cM0, G2, ER+, PR+, HER2-) - Signed by [name], PA-C on 12/29/2023 Stage prefix: Initial diagnosis Method of lymph node assessment: Sentinel lymph node biopsy Histologic grading system: 3 grade system   01/25/2024 Genetic Testing   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 The report date is 01/25/2024.  The CancerNext-Expanded gene panel offered by [facility] and includes sequencing, rearrangement, and RNA analysis for the  following 77 genes: AIP, ALK, APC, ATM, AXIN2, BAP1, BARD1, BMPR1A, BRCA1, BRCA2, BRIP1, CDC73, CDH1, CDK4, CDKN1B, CDKN2A, CEBPA, CHEK2, CTNNA1, DDX41, DICER1, ETV6, FH, FLCN, GATA2, LZTR1, MAX, MBD4, MEN1, MET, MLH1, MSH2, MSH3, MSH6, MUTYH, NF1, NF2, NTHL1, PALB2, PHOX2B, PMS2, POT1, PRKAR1A, PTCH1, PTEN, RAD51C, RAD51D, RB1, RET, RPS20, RUNX1, SDHA, SDHAF2, SDHB, SDHC, SDHD, SMAD4, SMARCA4, SMARCB1, SMARCE1, STK11, SUFU, TMEM127, TP53, TSC1, TSC2, VHL, and WT1 (sequencing and deletion/duplication); EGFR, HOXB13, KIT, MITF, PDGFRA, POLD1, and POLE (sequencing only); EPCAM and GREM1 (deletion/duplication only).    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  She is on weekly adjuvant paclitaxel  (cycle 1 of 12) after dose-dense Adriamycin  and Cytoxan . She has persistent decreased appetite without nausea or vomiting and maintains oral intake. Fatigue is present but less severe than during AC. She denies new gastrointestinal or constitutional symptoms.  Recent labs show white blood cell count 6.7 and platelets near 300. G-CSF injections have been stopped. Hemoglobin is mildly low.  She recently enrolled in a cold therapy trial to prevent paclitaxel  neuropathy. She could not tolerate full-body cryotherapy because of severe discomfort with violent shivering and teeth chattering and stopped after 30 minutes. Local cold exposure to hands and feet was tolerable. She tolerated diphenhydramine  premedication, with only drowsiness.  She reports a recent abnormal thyroid  panel with new elevations after three years  of stability, suspected to be chemotherapy related. She remains on a stable thyroid  medication dose without symptoms of thyroid  dysfunction and has reviewed the results with her endocrinologist, who kept her on the same dose.      ALLERGIES:  is allergic to codeine, morphine  sulfate, and other.  MEDICATIONS:  Current Outpatient Medications  Medication Sig Dispense Refill   ALPRAZolam (XANAX) 0.5 MG tablet Take 0.5 mg by mouth at bedtime as needed for anxiety.     busPIRone HCl (BUSPAR PO) Take by mouth.     Calcium Carbonate-Vit D-Min (CALCIUM 1200 PO) Take 1 tablet by mouth at bedtime.      Cranberry 125 MG TABS Take by mouth.     docusate sodium (COLACE) 100 MG capsule Take 100 mg by mouth 2 (two) times daily.     esomeprazole  (NEXIUM ) 40 MG capsule Take 1 capsule (40 mg total) by mouth daily at 12 noon. 90 capsule 1   famotidine  (PEPCID ) 20 MG tablet Take 20 mg by mouth at bedtime.     fluticasone  (FLONASE ) 50 MCG/ACT nasal spray Place 2 sprays into both nostrils daily. 16 g 10   levothyroxine  (SYNTHROID ) 88 MCG tablet Take 1 tablet (88 mcg total) by mouth daily before breakfast.     lidocaine -prilocaine  (EMLA ) cream Apply to affected area once 30 g 3   Multiple Vitamin (MULTIVITAMIN) tablet Take 1 tablet by mouth daily.     Naproxen Sod-diphenhydrAMINE  (ALEVE PM PO) Take 80 mg by mouth daily.     prochlorperazine  (COMPAZINE ) 10 MG tablet Take 1 tablet (10 mg total) by mouth every 6 (six) hours as needed for nausea or vomiting. 30 tablet 1   promethazine  (PHENERGAN ) 12.5 MG tablet Take 1 tablet (12.5 mg total) by mouth every 6 (six) hours as needed for nausea or vomiting. 30 tablet 1   Psyllium Fiber 0.52 g CAPS Take 0.52 g by mouth daily.     RESTASIS 0.05 % ophthalmic emulsion Place into both eyes.     sucralfate  (CARAFATE ) 1 g tablet Take 1 tablet (1 g total) by mouth 4 (four) times daily -  with meals and at bedtime. 120 tablet 1   Zinc 50 MG TABS Take by mouth.     ondansetron   (ZOFRAN ) 8 MG tablet TAKE 1 TAB BY MOUTH EVERY 8 HRS AS NEEDED FOR NAUSEA/VOMITING. START THIRD DAY AFTER DOXORUBICIN /CYCLOPHOSPHAMIDE  CHEMOTHERAPY. (Patient not taking: Reported on 04/04/2024) 30 tablet 1   No current facility-administered medications for this visit.   Facility-Administered Medications Ordered in Other Visits  Medication Dose Route Frequency Provider Last Rate Last Admin   0.9 %  sodium chloride  infusion   Intravenous Continuous Makar Slatter, MD 10 mL/hr at 04/04/24 1211 New Bag at 04/04/24 1211   PACLitaxel  (TAXOL ) 126 mg in sodium chloride  0.9 % 250 mL chemo infusion (</= 80mg /m2)  80 mg/m2 (Treatment Plan Recorded) Intravenous Once Odean Potts, MD        PHYSICAL EXAMINATION: ECOG PERFORMANCE STATUS: 1 - Symptomatic but completely ambulatory  Vitals:   04/04/24 1142  BP: 120/80  Pulse: 71  Resp: 18  Temp: 97.9 F (36.6 C)  SpO2: 99%   Filed Weights   04/04/24 1142  Weight: 123 lb 14.4 oz (56.2 kg)    LABORATORY DATA:  I have reviewed the data as listed    Latest Ref Rng & Units 04/04/2024   11:21 AM 03/28/2024    9:33 AM 03/13/2024    9:51 AM  CMP  Glucose 70 - 99  mg/dL 892  892  98   BUN 6 - 20 mg/dL 11  12  8    Creatinine 0.44 - 1.00 mg/dL 9.53  9.49  9.53   Sodium 135 - 145 mmol/L 138  139  140   Potassium 3.5 - 5.1 mmol/L 3.7  3.6  4.0   Chloride 98 - 111 mmol/L 103  104  105   CO2 22 - 32 mmol/L 25  26  26    Calcium 8.9 - 10.3 mg/dL 8.9  8.9  9.0   Total Protein 6.5 - 8.1 g/dL 6.5  6.4  6.6   Total Bilirubin 0.0 - 1.2 mg/dL 0.4  0.3  0.2   Alkaline Phos 38 - 126 U/L 81  131  167   AST 15 - 41 U/L 26  21  26    ALT 0 - 44 U/L 16  10  17      Lab Results  Component Value Date   WBC 6.7 04/04/2024   HGB 10.0 (L) 04/04/2024   HCT 29.4 (L) 04/04/2024   MCV 89.9 04/04/2024   PLT 299 04/04/2024   NEUTROABS 4.8 04/04/2024    ASSESSMENT & PLAN:  Malignant neoplasm of upper-outer quadrant of right breast in female, estrogen receptor  positive (HCC) 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, anterior margin excision: Focus of IDC grade 2 with DCIS final margins negative  Oncotype DX: Recurrence score 31 (18% risk of recurrence)   01/05/2024: CT CAP and bone scan: Simple cysts in the liver largest 1.4 cm, several sub-5 mm ill-defined foci too small to characterize, few prominent right axillary lymph nodes, seroma right axilla 4.2 cm, ER 100%, PR 10%, HER2 1+ negative, Ki67 20%   Treatment plan: Adjuvant chemotherapy with dose dense Adriamycin  and Cytoxan  x 4 followed by Taxol  x 12 Adjuvant radiation therapy Antiestrogen therapy ----------------------------------------------------------------------------------------------------------------------------------------- Current treatment: Completed 4 cycles of dose dense Adriamycin  and Cytoxan , today cycle 2 Taxol  Chemo toxicities: Chemo-induced nausea and vomiting: She continued to have nausea and vomiting in spite of lowering the dosage.  She will try taking alprazolam during the daytime to reduce the nausea for those 2 days. Fatigue Thrombocytopenia: Recovered Mild anemia secondary to chemotherapy: Being watched and monitored   Return to clinic weekly for Taxol  treatments    No orders of the defined types were placed in this encounter.  The patient has a good understanding of the overall plan. she agrees with it. she will call with any problems that may develop before the next visit here.  I personally spent a total of 30 minutes in the care of the patient today including preparing to see the patient, getting/reviewing separately obtained history, performing a medically appropriate exam/evaluation, counseling and educating, placing orders, referring and communicating with other health care professionals, documenting clinical information in the EHR, independently interpreting  results, communicating results, and coordinating care.   Viinay K Lelynd Poer, MD 04/04/2024    "

## 2024-04-04 NOTE — Patient Instructions (Signed)
 [facility] - A DEPT OF [facility]  Discharge Instructions: Thank you for choosing [facility] to provide your oncology and hematology care.   If you have a lab appointment with the Cancer Center, please go directly to the Cancer Center and check in at the registration area.   Wear comfortable clothing and clothing appropriate for easy access to any Portacath or PICC line.   We strive to give you quality time with your provider. You may need to reschedule your appointment if you arrive late (15 or more minutes).  Arriving late affects you and other patients whose appointments are after yours.  Also, if you miss three or more appointments without notifying the office, you may be dismissed from the clinic at the provider's discretion.      For prescription refill requests, have your pharmacy contact our office and allow 72 hours for refills to be completed.    Today you received the following chemotherapy and/or immunotherapy agents: Paclitaxel      To help prevent nausea and vomiting after your treatment, we encourage you to take your nausea medication as directed.  BELOW ARE SYMPTOMS THAT SHOULD BE REPORTED IMMEDIATELY: *FEVER GREATER THAN 100.4 F (38 C) OR HIGHER *CHILLS OR SWEATING *NAUSEA AND VOMITING THAT IS NOT CONTROLLED WITH YOUR NAUSEA MEDICATION *UNUSUAL SHORTNESS OF BREATH *UNUSUAL BRUISING OR BLEEDING *URINARY PROBLEMS (pain or burning when urinating, or frequent urination) *BOWEL PROBLEMS (unusual diarrhea, constipation, pain near the anus) TENDERNESS IN MOUTH AND THROAT WITH OR WITHOUT PRESENCE OF ULCERS (sore throat, sores in mouth, or a toothache) UNUSUAL RASH, SWELLING OR PAIN  UNUSUAL VAGINAL DISCHARGE OR ITCHING   Items with * indicate a potential emergency and should be followed up as soon as possible or go to the Emergency Department if any problems should occur.  Please show the CHEMOTHERAPY ALERT CARD or IMMUNOTHERAPY  ALERT CARD at check-in to the Emergency Department and triage nurse.  Should you have questions after your visit or need to cancel or reschedule your appointment, please contact [facility] - A DEPT OF [facility]  Dept: [phone]  and follow the prompts.  Office hours are 8:00 a.m. to 4:30 p.m. Monday - Friday. Please note that voicemails left after 4:00 p.m. may not be returned until the following business day.  We are closed weekends and major holidays. You have access to a nurse at all times for urgent questions. Please call the main number to the clinic Dept: [phone] and follow the prompts.   For any non-urgent questions, you may also contact your provider using MyChart. We now offer e-Visits for anyone 37 and older to request care online for non-urgent symptoms. For details visit [email or web address].   Also download the MyChart app! Go to the app store, search "MyChart", open the app, select [facility], and log in with your MyChart username and password.  Paclitaxel Injection What is this medication? PACLITAXEL (PAK li TAX el) treats some types of cancer. It works by slowing down the growth of cancer cells. This medicine may be used for other purposes; ask your health care provider or pharmacist if you have questions. COMMON BRAND NAME(S): Onxol, Taxol What should I tell my care team before I take this medication? They need to know if you have any of these conditions: Heart disease Liver disease Low white blood cell levels An unusual or allergic reaction to paclitaxel, other medications, foods,  dyes, or preservatives If you or your partner are pregnant or trying to get pregnant Breast-feeding How should I use this medication? This medication is injected into a vein. It is given by your care team in a hospital or clinic setting. Talk to your care team about the use of this medication in children. While it may be given to children for selected  conditions, precautions do apply. Overdosage: If you think you have taken too much of this medicine contact a poison control center or emergency room at once. NOTE: This medicine is only for you. Do not share this medicine with others. What if I miss a dose? Keep appointments for follow-up doses. It is important not to miss your dose. Call your care team if you are unable to keep an appointment. What may interact with this medication? Do not take this medication with any of the following: Live virus vaccines Other medications may affect the way this medication works. Talk with your care team about all of the medications you take. They may suggest changes to your treatment plan to lower the risk of side effects and to make sure your medications work as intended. This list may not describe all possible interactions. Give your health care provider a list of all the medicines, herbs, non-prescription drugs, or dietary supplements you use. Also tell them if you smoke, drink alcohol, or use illegal drugs. Some items may interact with your medicine. What should I watch for while using this medication? Your condition will be monitored carefully while you are receiving this medication. You may need blood work while taking this medication. This medication may make you feel generally unwell. This is not uncommon as chemotherapy can affect healthy cells as well as cancer cells. Report any side effects. Continue your course of treatment even though you feel ill unless your care team tells you to stop. This medication can cause serious allergic reactions. To reduce the risk, your care team may give you other medications to take before receiving this one. Be sure to follow the directions from your care team. This medication may increase your risk of getting an infection. Call your care team for advice if you get a fever, chills, sore throat, or other symptoms of a cold or flu. Do not treat yourself. Try to avoid  being around people who are sick. This medication may increase your risk to bruise or bleed. Call your care team if you notice any unusual bleeding. Be careful brushing or flossing your teeth or using a toothpick because you may get an infection or bleed more easily. If you have any dental work done, tell your dentist you are receiving this medication. Talk to your care team if you may be pregnant. Serious birth defects can occur if you take this medication during pregnancy. Talk to your care team before breastfeeding. Changes to your treatment plan may be needed. What side effects may I notice from receiving this medication? Side effects that you should report to your care team as soon as possible: Allergic reactions--skin rash, itching, hives, swelling of the face, lips, tongue, or throat Heart rhythm changes--fast or irregular heartbeat, dizziness, feeling faint or lightheaded, chest pain, trouble breathing Increase in blood pressure Infection--fever, chills, cough, sore throat, wounds that don't heal, pain or trouble when passing urine, general feeling of discomfort or being unwell Low blood pressure--dizziness, feeling faint or lightheaded, blurry vision Low red blood cell level--unusual weakness or fatigue, dizziness, headache, trouble breathing Painful swelling, warmth, or redness of  the skin, blisters or sores at the infusion site Pain, tingling, or numbness in the hands or feet Slow heartbeat--dizziness, feeling faint or lightheaded, confusion, trouble breathing, unusual weakness or fatigue Unusual bruising or bleeding Side effects that usually do not require medical attention (report to your care team if they continue or are bothersome): Diarrhea Hair loss Joint pain Loss of appetite Muscle pain Nausea Vomiting This list may not describe all possible side effects. Call your doctor for medical advice about side effects. You may report side effects to FDA at 1-800-FDA-1088. Where  should I keep my medication? This medication is given in a hospital or clinic. It will not be stored at home. NOTE: This sheet is a summary. It may not cover all possible information. If you have questions about this medicine, talk to your doctor, pharmacist, or health care provider.  2024 Elsevier/Gold Standard (2021-08-19 00:00:00)

## 2024-04-04 NOTE — Assessment & Plan Note (Signed)
 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, anterior margin excision: Focus of IDC grade 2 with DCIS final margins negative  Oncotype DX: Recurrence score 31 (18% risk of recurrence)   01/05/2024: CT CAP and bone scan: Simple cysts in the liver largest 1.4 cm, several sub-5 mm ill-defined foci too small to characterize, few prominent right axillary lymph nodes, seroma right axilla 4.2 cm, ER 100%, PR 10%, HER2 1+ negative, Ki67 20%   Treatment plan: Adjuvant chemotherapy with dose dense Adriamycin  and Cytoxan  x 4 followed by Taxol  x 12 Adjuvant radiation therapy Antiestrogen therapy ----------------------------------------------------------------------------------------------------------------------------------------- Current treatment: Completed 4 cycles of dose dense Adriamycin  and Cytoxan , today cycle 2 Taxol  Chemo toxicities: Chemo-induced nausea and vomiting: She continued to have nausea and vomiting in spite of lowering the dosage.  She will try taking alprazolam during the daytime to reduce the nausea for those 2 days. Fatigue Thrombocytopenia: Recovered Mild anemia secondary to chemotherapy: Being watched and monitored   Return to clinic weekly for Taxol  treatments

## 2024-04-04 NOTE — Research (Signed)
 DCP-001: Use of a Clinical Trial Screening Tool to Address Cancer Health Disparities in the [employer] Community Oncology Research Program [facility])  Patient Melinda Forbes was identified by Dr. Gudena as a potential candidate for the above listed study.  This Clinical Research Nurse met with Melinda Forbes, [id number] on 04/04/2024 in a manner and location that ensures patient privacy to discuss participation in the above listed research study.  Patient is Accompanied by her husband. A copy of the informed consent document and separate HIPAA Authorization was provided to the patient.  Patient reads, speaks, and understands English. Patient was previously provided with informed consent documents.  Patient has not yet read the informed consent documents and so documents were reviewed page by page today.  Patient was provided with the business card of this Nurse and encouraged to contact the research team with any questions.  Patient was provided the option of taking informed consent documents home to review and was encouraged to review at their convenience with their support network, including other care providers. Patient is comfortable with making a decision regarding study participation today.  As outlined in the informed consent form, this Nurse and Melinda Forbes discussed the purpose of the research study, the investigational nature of the study, study procedures and requirements for study participation, potential risks and benefits of study participation, as well as alternatives to participation.   The patient understands participation is voluntary and they may withdraw from study participation at any time. Patient understands enrollment is pending full eligibility review.   Confidentiality and how the patient's information will be used as part of study participation were discussed.  Patient was informed there is not reimbursement provided for their time and effort spent on trial participation.     All questions were answered to patient's satisfaction.  The informed consent and separate HIPAA Authorization was reviewed page by page.  The patient's mental and emotional status is appropriate to provide informed consent, and the patient verbalizes an understanding of study participation.  Patient has agreed to participate in the above listed research study and has voluntarily signed the informed consent version [facility] active date 08-30-23 and separate HIPAA Authorization, version date approved 08-31-23 on 04/04/2024 at 1210PM.  The patient was provided with a copy of the signed informed consent form and separate HIPAA Authorization for their reference.  No study specific procedures were obtained prior to the signing of the informed consent document.  Approximately 20 minutes were spent with the patient reviewing the informed consent documents.  Patient was not requested to complete a Release of Information form.   Eligibility: This Nurse has reviewed this patient's inclusion and exclusion criteria and confirmed Melinda Forbes is eligible for study participation.  Patient will continue with enrollment.  Eligibility confirmed by treating investigator, who also agrees that patient should proceed with enrollment.  Research RN saw pt in infusion today. Pt had no questions about the study and agreed to participate. Pt signed consent and information for study was obtained. Pt was thanked for her time and participation in this study. Pt has contact information of research RN if needed for any questions or concerns.    Melinda Forbes BSN RN Clinical Research Nurse [facility] Direct Dial: [phone] 04/04/2024  12:45 PM

## 2024-04-10 DIAGNOSIS — Z170 Estrogen receptor positive status [ER+]: Secondary | ICD-10-CM

## 2024-04-10 NOTE — Research (Signed)
 [id number], ICE COMPRESS: RANDOMIZED TRIAL OF LIMB CRYOCOMPRESSION VERSUS CONTINUOUS COMPRESSION VERSUS LOW CYCLIC COMPRESSION FOR THE PREVENTION OF TAXANE-INDUCED PERIPHERAL NEUROPATHY    Patient arrives 04-04-24 for Cycle 2 accompanied by husband [name] for the week 2 treatment. Confirmed patient does not have wounds, sores, or lesions to extremities. Patient has not had any vaccinations since last visit.   MD/PROVIDER VISIT: Patient sees Dr.Gudena for today's visit.   ADVERSE EVENTS: Solicited AEs reviewed with patient.  She denies any sensory or motor neuropathy symptoms. See AE table below     Adverse Event CTCAE Grade Onset date Resolved date Relationship to Study Intervention Action Taken Comments  Skin atrophy (solicited) 0            Skin hyperpigmentation (solicited) 0            Skin hypopigmentation (solicited) 0            Skin induration (solicited) 0            Skin ulceration (solicited) 0            Rash maculopapular (solicited) 0            Nail changes (solicited) 0            Cold intolerance (solicited) (general disorders and administration site conditions- other) 0            Frostbite (solicited) (skin and subcutaneous tissue disorders- other) 0                STUDY INTERVENTION & TOLERABILITY ASSESSMENTS: Study device was not used during this treatment, pt withdrew from study device during Cycle 1 (03-28-24).   The patient was thanked for their time and continued voluntary participation in this study.  Patient Melinda Forbes has been provided direct contact information and is encouraged to contact this Nurse for any needs or question.   Delon Pinal BSN RN Clinical Research Nurse [facility] Direct Dial: [phone] 04/10/2024  8:39 AM

## 2024-04-10 NOTE — Progress Notes (Unsigned)
 " [facility] [phone]   [phone]  PROGRESS NOTE  Patient Care Team: [name], MD as PCP - General (Family Medicine) [name], MD as Consulting Physician (Endocrinology) [name], MD as Consulting Physician (Plastic Surgery) [name], MD as Consulting Physician (General Surgery) [name], RN as Oncology Nurse Navigator [name], Devere HERO, RN as Oncology Nurse Navigator Odean Potts, MD as Consulting Physician (Hematology and Oncology) [name], MD as Consulting Physician (General Surgery) [name], MD as Consulting Physician (Radiation Oncology)  CHIEF COMPLAINTS/PURPOSE OF CONSULTATION:  Right breast cancer   Oncology History  Malignant neoplasm of upper-outer quadrant of right breast in female, estrogen receptor positive (HCC)  12/09/2023 Initial Diagnosis   Palpable mass in the right breast measured 1.6 cm by mammogram and ultrasound.  Biopsy: Grade 2 IDC Forbes 100%, PR 10%, Ki67 20%, HER2 1+ negative   12/23/2023 Surgery   Right lumpectomy: 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, margins negative, 1/3 lymph node with micrometastatic disease, anterior margin excision: Focus of IDC grade 2 with DCIS final margins negative   12/29/2023 Cancer Staging   Staging form: Breast, AJCC 8th Edition - Pathologic stage from 12/29/2023: Stage IB (pT2, pN50mi(sn), cM0, G2, Forbes+, PR+, HER2-) - Signed by [name], PA-C on 12/29/2023 Stage prefix: Initial diagnosis Method of lymph node assessment: Sentinel lymph node biopsy Histologic grading system: 3 grade system   01/25/2024 Genetic Testing   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 The report date is 01/25/2024.  The CancerNext-Expanded gene panel offered by [facility] and includes sequencing, rearrangement, and RNA analysis for the following 77 genes: AIP, ALK, APC,  ATM, AXIN2, BAP1, BARD1, BMPR1A, BRCA1, BRCA2, BRIP1, CDC73, CDH1, CDK4, CDKN1B, CDKN2A, CEBPA, CHEK2, CTNNA1, DDX41, DICER1, ETV6, FH, FLCN, GATA2, LZTR1, MAX, MBD4, MEN1, MET, MLH1, MSH2, MSH3, MSH6, MUTYH, NF1, NF2, NTHL1, PALB2, PHOX2B, PMS2, POT1, PRKAR1A, PTCH1, PTEN, RAD51C, RAD51D, RB1, RET, RPS20, RUNX1, SDHA, SDHAF2, SDHB, SDHC, SDHD, SMAD4, SMARCA4, SMARCB1, SMARCE1, STK11, SUFU, TMEM127, TP53, TSC1, TSC2, VHL, and WT1 (sequencing and deletion/duplication); EGFR, HOXB13, KIT, MITF, PDGFRA, POLD1, and POLE (sequencing only); EPCAM and GREM1 (deletion/duplication only).    01/28/2024 -  Chemotherapy   Patient is on Treatment Plan : BREAST DOSE DENSE AC q14d / PACLitaxel  q7d       CURRENT TREATMENT: Taxol   INTERVAL HISTORY:  Melinda Forbes 58 y.o. female returns for a toxicity check prior to cycle 7 of Taxol  therapy.  On exam today, Melinda Forbes reports she is tolerating chemotherapy significantly better than AC regimen. She does have fatigue but is able to complete her ADLs independently. She does get winded with exertion but that improves with rest. She denies nausea, vomiting or bowel habit changes. She denies easy bruising or overt signs of bleeding. She denies any neuropathy at this time. She denies fevers, chills, sweats, chest pain or cough. Rest of the ROS is below.   MEDICAL HISTORY:  Past Medical History:  Diagnosis Date   Breast cancer (HCC) 11/2023   Right   Bursitis left hip   Chronic throat clearing    ENT eval 2008 by Dr. Ethyl showed normal vocal cords and  slight edema of the arytenoid mucosa possibly indicative of globus type sx's and possible reflux symptoms.   Complication of anesthesia    COVID 03/04/2020   congestion loss of taste and smell x 7 days, took monoclonal antibody infusion  GERD (gastroesophageal reflux disease)    omeprazole  daily helps   Hashimoto's thyroiditis    History of bronchitis    Hypothyroidism    managed by endocrinologist (Dr.  Tommas)   Pelvic congestion syndrome    on left side   Perforation bowel (HCC)    colon perforation 11-30-2019 hospitialized, and march 2022   Pericardial effusion    PONV (postoperative nausea and vomiting)    nausea   Wears glasses    for reading    SURGICAL HISTORY: Past Surgical History:  Procedure Laterality Date   BREAST BIOPSY Right 12/09/2023   US  RT BREAST BX W LOC DEV 1ST LESION IMG BX SPEC US  GUIDE 12/09/2023 [facility]   BREAST BIOPSY  12/21/2023   US  RT RADIOACTIVE SEED LOC 12/21/2023 [facility]MO[facility]   BREAST LUMPECTOMY WITH RADIOACTIVE SEED AND SENTINEL LYMPH NODE BIOPSY Right 12/23/2023   Procedure: RIGHT BREAST LUMPECTOMY WITH RADIOACTIVE SEED AND SENTINEL LYMPH NODE BIOPSY;  Surgeon: [name], MD;  Location: [facility];  Service: General;  Laterality: Right;  RIGHT BREAST RADIOACTIVE SEED LOCALIZED LUMPECTOMYSENTINEL NODE BIOPSY; Right arm restriction   CESAREAN SECTION  1988   first pregnancy.  Her second delivery was vaginal.   colonscopy  01/2020   x 3   CYST EXCISION  1999   Epidermal inclusion cyst in right groin (Dr. Mikell)   GROIN DEBRIDEMENT  1999   cyst removed   IR IMAGING GUIDED PORT INSERTION  01/27/2024   LAPAROSCOPY N/A 08/22/2020   Procedure: LAPAROSCOPY OPERATIVE, LYSIS OF ADHESIONS;  Surgeon: [name], MD;  Location: [facility];  Service: Gynecology;  Laterality: N/A;   MYRINGOTOMY WITH TUBE PLACEMENT Left 08/2022    SOCIAL HISTORY: Social History   Socioeconomic History   Marital status: Married    Spouse name: Not on file   Number of children: 3   Years of education: Not on file   Highest education level: Not on file  Occupational History   Not on file  Tobacco Use   Smoking status: Never    Passive exposure: Past   Smokeless tobacco: Never  Vaping Use   Vaping status: Never Used  Substance and Sexual Activity   Alcohol use: No   Drug use: No   Sexual activity: Not on file   Other Topics Concern   Not on file  Social History Narrative   Divorced, engaged.     Three grown children.   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  Bachelor's degree from [place]  state.     She is originally from [place].   No T/A/Ds.   Exercise: walks a lot (1-2 times per week).   She tries to eat healthy.   Social Drivers of Health   Tobacco Use: Low Risk (01/28/2024)   Patient History    Smoking Tobacco Use: Never    Smokeless Tobacco Use: Never    Passive Exposure: Past  Financial Resource Strain: Not on file  Food Insecurity: No Food Insecurity (12/30/2023)   Epic    Worried About Programme Researcher, Broadcasting/film/video in the Last Year: Never true    Ran Out of Food in the Last Year: Never true  Transportation Needs: No Transportation Needs (12/30/2023)   Epic    Lack of Transportation (Medical): No    Lack of Transportation (Non-Medical): No  Physical Activity: Not on file  Stress: Not on file  Social Connections: Unknown (05/15/2022)   Received from [facility]  Social Network    Social Network: Not on file  Intimate Partner Violence: Not At Risk (12/30/2023)   Epic    Fear of Current or Ex-Partner: No    Emotionally Abused: No    Physically Abused: No    Sexually Abused: No  Depression (PHQ2-9): Low Risk (01/28/2024)   Depression (PHQ2-9)    PHQ-2 Score: 0  Alcohol Screen: Not on file  Housing: Low Risk (12/30/2023)   Epic    Unable to Pay for Housing in the Last Year: No    Number of Times Moved in the Last Year: 0    Homeless in the Last Year: No  Utilities: Not At Risk (12/30/2023)   Epic    Threatened with loss of utilities: No  Health Literacy: Not on file    FAMILY HISTORY: Family History  Problem Relation Age of Onset   Diabetes Mother    Breast cancer Mother        dx 74s   Heart disease Father        first MI age 57   Hypertension Father    Cancer Father        myelodysplastic metaplasia   Breast cancer Paternal Aunt        dx 3 times, in 62s  initially   Lung cancer Paternal Aunt    Cervical cancer Paternal Aunt 37   Breast cancer Maternal Grandmother 59   Bone cancer Paternal Grandfather     ALLERGIES:  is allergic to codeine, morphine  sulfate, and other.  MEDICATIONS:  Current Outpatient Medications  Medication Sig Dispense Refill   ALPRAZolam (XANAX) 0.5 MG tablet Take 0.5 mg by mouth at bedtime as needed for anxiety.     busPIRone HCl (BUSPAR PO) Take by mouth.     Calcium Carbonate-Vit D-Min (CALCIUM 1200 PO) Take 1 tablet by mouth at bedtime.      Cranberry 125 MG TABS Take by mouth.     docusate sodium (COLACE) 100 MG capsule Take 100 mg by mouth 2 (two) times daily.     esomeprazole  (NEXIUM ) 40 MG capsule Take 1 capsule (40 mg total) by mouth daily at 12 noon. 90 capsule 1   famotidine  (PEPCID ) 20 MG tablet Take 20 mg by mouth at bedtime.     fluticasone  (FLONASE ) 50 MCG/ACT nasal spray Place 2 sprays into both nostrils daily. 16 g 10   levothyroxine  (SYNTHROID ) 88 MCG tablet Take 1 tablet (88 mcg total) by mouth daily before breakfast.     lidocaine -prilocaine  (EMLA ) cream Apply to affected area once 30 g 3   Multiple Vitamin (MULTIVITAMIN) tablet Take 1 tablet by mouth daily.     Naproxen Sod-diphenhydrAMINE  (ALEVE PM PO) Take 80 mg by mouth daily.     prochlorperazine  (COMPAZINE ) 10 MG tablet Take 1 tablet (10 mg total) by mouth every 6 (six) hours as needed for nausea or vomiting. 30 tablet 1   promethazine  (PHENERGAN ) 12.5 MG tablet Take 1 tablet (12.5 mg total) by mouth every 6 (six) hours as needed for nausea or vomiting. 30 tablet 1   Psyllium Fiber 0.52 g CAPS Take 0.52 g by mouth daily.     RESTASIS 0.05 % ophthalmic emulsion Place into both eyes.     sucralfate  (CARAFATE ) 1 g tablet Take 1 tablet (1 g total) by mouth 4 (four) times daily -  with meals and at bedtime. 120 tablet 1   Zinc 50 MG TABS Take by mouth.     ondansetron  (ZOFRAN ) 8  MG tablet TAKE 1 TAB BY MOUTH EVERY 8 HRS AS NEEDED FOR  NAUSEA/VOMITING. START THIRD DAY AFTER DOXORUBICIN /CYCLOPHOSPHAMIDE  CHEMOTHERAPY. (Patient not taking: Reported on 04/11/2024) 30 tablet 1   No current facility-administered medications for this visit.   Facility-Administered Medications Ordered in Other Visits  Medication Dose Route Frequency Provider Last Rate Last Admin   0.9 %  sodium chloride  infusion   Intravenous Continuous Odean Potts, MD   Stopped at 04/11/24 1313    REVIEW OF SYSTEMS:   Constitutional: ( - ) fevers, ( - )  chills , ( - ) night sweats Eyes: ( - ) blurriness of vision, ( - ) double vision, ( - ) watery eyes Ears, nose, mouth, throat, and face: ( - ) mucositis, ( - ) sore throat Respiratory: ( - ) cough, ( - ) dyspnea, ( - ) wheezes Cardiovascular: ( - ) palpitation, ( - ) chest discomfort, ( - ) lower extremity swelling Gastrointestinal:  ( - ) nausea, ( - ) heartburn, ( - ) change in bowel habits Skin: ( - ) abnormal skin rashes Lymphatics: ( - ) new lymphadenopathy, ( - ) easy bruising Neurological: ( - ) numbness, ( - ) tingling, ( - ) new weaknesses Behavioral/Psych: ( - ) mood change, ( - ) new changes  All other systems were reviewed with the patient and are negative.  PHYSICAL EXAMINATION: ECOG PERFORMANCE STATUS: 1 - Symptomatic but completely ambulatory  Vitals:   04/11/24 1010  BP: 127/70  Pulse: 69  Resp: 20  Temp: (!) 97.5 F (36.4 C)  SpO2: 100%   Filed Weights   04/11/24 1010  Weight: 127 lb 4.8 oz (57.7 kg)    GENERAL: well appearing female in NAD  SKIN: skin color, texture, turgor are normal, no rashes or significant lesions EYES: conjunctiva are pink and non-injected, sclera clear OROPHARYNX: no exudate, no erythema; lips, buccal mucosa, and tongue normal  NECK: supple, non-tender LYMPH:  no palpable lymphadenopathy in the cervical, axillary or supraclavicular lymph nodes.  LUNGS: clear to auscultation and percussion with normal breathing effort HEART: regular rate & rhythm and  no murmurs and no lower extremity edema ABDOMEN: soft, non-tender, non-distended, normal bowel sounds Musculoskeletal: no cyanosis of digits and no clubbing  PSYCH: alert & oriented x 3, fluent speech NEURO: no focal motor/sensory deficits  LABORATORY DATA:  I have reviewed the data as listed    Latest Ref Rng & Units 04/11/2024    9:53 AM 04/04/2024   11:21 AM 03/28/2024    9:33 AM  CBC  WBC 4.0 - 10.5 K/uL 3.9  6.7  23.8   Hemoglobin 12.0 - 15.0 g/dL 9.9  89.9  89.4   Hematocrit 36.0 - 46.0 % 29.0  29.4  30.8   Platelets 150 - 400 K/uL 207  299  147        Latest Ref Rng & Units 04/11/2024    9:53 AM 04/04/2024   11:21 AM 03/28/2024    9:33 AM  CMP  Glucose 70 - 99 mg/dL 872  892  892   BUN 6 - 20 mg/dL 13  11  12    Creatinine 0.44 - 1.00 mg/dL 9.46  9.53  9.49   Sodium 135 - 145 mmol/L 141  138  139   Potassium 3.5 - 5.1 mmol/L 3.5  3.7  3.6   Chloride 98 - 111 mmol/L 107  103  104   CO2 22 - 32 mmol/L 24  25  26  Calcium 8.9 - 10.3 mg/dL 9.0  8.9  8.9   Total Protein 6.5 - 8.1 g/dL 6.2  6.5  6.4   Total Bilirubin 0.0 - 1.2 mg/dL 0.3  0.4  0.3   Alkaline Phos 38 - 126 U/L 69  81  131   AST 15 - 41 U/L 22  26  21    ALT 0 - 44 U/L 10  16  10       ASSESSMENT & PLAN Cary Bunton Sherbert is a 57 y.o. female who presents to the clinic for right breast cancer.   #Malignant neoplasm of upper-outer quadrant of right breast in female, estrogen receptor positive (HCC) --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, anterior margin excision: Focus of IDC grade 2 with DCIS final margins negative  --Oncotype DX: Recurrence score 31 (18% risk of recurrence) --01/05/2024: CT CAP and bone scan: Simple cysts in the liver largest 1.4 cm, several sub-5 mm ill-defined foci too small to characterize, few prominent right axillary lymph nodes, seroma right axilla 4.2 cm, Forbes 100%, PR 10%, HER2 1+  negative, Ki67 20% --Treatment plan:Adjuvant chemotherapy with dose dense Adriamycin  and Cytoxan  x 4 followed by Taxol  x 12, Adjuvant radiation therapy, Antiestrogen therapy PLAN: --Due for Cycle 7 of Taxol  therapy today --Labs from today were reviewed and adequate for treatment. WBC 3.9, Hgb 9.9, Plt 207, creatinine and LFTS are normal.  --Proceed with treatment without any dose modifications . --RTC in one week with labs and follow up before Cycle 8 of Taxol    No orders of the defined types were placed in this encounter.   All questions were answered. The patient knows to call the clinic with any problems, questions or concerns.  I have spent a total of 30 minutes minutes of face-to-face and non-face-to-face time, preparing to see the patient, performing a medically appropriate examination, counseling and educating the patient, documenting clinical information in the electronic health record, independently interpreting results and communicating results to the patient, and care coordination.   Johnston Police, PA-C Department of Hematology/Oncology [facility] at [facility] Phone: [phone] "

## 2024-04-11 ENCOUNTER — Inpatient Hospital Stay

## 2024-04-11 ENCOUNTER — Inpatient Hospital Stay (HOSPITAL_BASED_OUTPATIENT_CLINIC_OR_DEPARTMENT_OTHER): Admitting: Physician Assistant

## 2024-04-11 VITALS — BP 127/70 | HR 69 | Temp 97.5°F | Resp 20 | Wt 127.3 lb

## 2024-04-11 DIAGNOSIS — Z170 Estrogen receptor positive status [ER+]: Secondary | ICD-10-CM

## 2024-04-11 DIAGNOSIS — C50411 Malignant neoplasm of upper-outer quadrant of right female breast: Secondary | ICD-10-CM

## 2024-04-11 DIAGNOSIS — Z5111 Encounter for antineoplastic chemotherapy: Secondary | ICD-10-CM | POA: Diagnosis not present

## 2024-04-11 LAB — CBC WITH DIFFERENTIAL (CANCER CENTER ONLY)
Abs Immature Granulocytes: 0.05 K/uL (ref 0.00–0.07)
Basophils Absolute: 0.1 K/uL (ref 0.0–0.1)
Basophils Relative: 2 %
Eosinophils Absolute: 0.1 K/uL (ref 0.0–0.5)
Eosinophils Relative: 3 %
HCT: 29 % — ABNORMAL LOW (ref 36.0–46.0)
Hemoglobin: 9.9 g/dL — ABNORMAL LOW (ref 12.0–15.0)
Immature Granulocytes: 1 %
Lymphocytes Relative: 16 %
Lymphs Abs: 0.6 K/uL — ABNORMAL LOW (ref 0.7–4.0)
MCH: 31.2 pg (ref 26.0–34.0)
MCHC: 34.1 g/dL (ref 30.0–36.0)
MCV: 91.5 fL (ref 80.0–100.0)
Monocytes Absolute: 0.4 K/uL (ref 0.1–1.0)
Monocytes Relative: 11 %
Neutro Abs: 2.7 K/uL (ref 1.7–7.7)
Neutrophils Relative %: 67 %
Platelet Count: 207 K/uL (ref 150–400)
RBC: 3.17 MIL/uL — ABNORMAL LOW (ref 3.87–5.11)
RDW: 17.2 % — ABNORMAL HIGH (ref 11.5–15.5)
WBC Count: 3.9 K/uL — ABNORMAL LOW (ref 4.0–10.5)
nRBC: 0 % (ref 0.0–0.2)

## 2024-04-11 LAB — CMP (CANCER CENTER ONLY)
ALT: 10 U/L (ref 0–44)
AST: 22 U/L (ref 15–41)
Albumin: 4.2 g/dL (ref 3.5–5.0)
Alkaline Phosphatase: 69 U/L (ref 38–126)
Anion gap: 10 (ref 5–15)
BUN: 13 mg/dL (ref 6–20)
CO2: 24 mmol/L (ref 22–32)
Calcium: 9 mg/dL (ref 8.9–10.3)
Chloride: 107 mmol/L (ref 98–111)
Creatinine: 0.53 mg/dL (ref 0.44–1.00)
GFR, Estimated: >60 mL/min
Glucose, Bld: 127 mg/dL — ABNORMAL HIGH (ref 70–99)
Potassium: 3.5 mmol/L (ref 3.5–5.1)
Sodium: 141 mmol/L (ref 135–145)
Total Bilirubin: 0.3 mg/dL (ref 0.0–1.2)
Total Protein: 6.2 g/dL — ABNORMAL LOW (ref 6.5–8.1)

## 2024-04-11 MED ORDER — DIPHENHYDRAMINE HCL 50 MG/ML IJ SOLN
50.0000 mg | Freq: Once | INTRAMUSCULAR | Status: AC
  Administered 2024-04-11: 50 mg via INTRAVENOUS
  Filled 2024-04-11: qty 1

## 2024-04-11 MED ORDER — SODIUM CHLORIDE 0.9 % IV SOLN
10.0000 mg | Freq: Once | INTRAVENOUS | Status: AC
  Administered 2024-04-11: 10 mg via INTRAVENOUS
  Filled 2024-04-11: qty 10

## 2024-04-11 MED ORDER — SODIUM CHLORIDE 0.9 % IV SOLN: INTRAVENOUS | Status: DC

## 2024-04-11 MED ORDER — SODIUM CHLORIDE 0.9 % IV SOLN
80.0000 mg/m2 | Freq: Once | INTRAVENOUS | Status: AC
  Administered 2024-04-11: 126 mg via INTRAVENOUS
  Filled 2024-04-11: qty 21

## 2024-04-11 MED ORDER — FAMOTIDINE IN NACL 20-0.9 MG/50ML-% IV SOLN
20.0000 mg | Freq: Once | INTRAVENOUS | Status: AC
  Administered 2024-04-11: 20 mg via INTRAVENOUS
  Filled 2024-04-11: qty 50

## 2024-04-11 NOTE — Patient Instructions (Signed)
 [facility] - A DEPT OF [facility]  Discharge Instructions: Thank you for choosing [facility] to provide your oncology and hematology care.   If you have a lab appointment with the Cancer Center, please go directly to the Cancer Center and check in at the registration area.   Wear comfortable clothing and clothing appropriate for easy access to any Portacath or PICC line.   We strive to give you quality time with your provider. You may need to reschedule your appointment if you arrive late (15 or more minutes).  Arriving late affects you and other patients whose appointments are after yours.  Also, if you miss three or more appointments without notifying the office, you may be dismissed from the clinic at the provider's discretion.      For prescription refill requests, have your pharmacy contact our office and allow 72 hours for refills to be completed.    Today you received the following chemotherapy and/or immunotherapy agents: Paclitaxel      To help prevent nausea and vomiting after your treatment, we encourage you to take your nausea medication as directed.  BELOW ARE SYMPTOMS THAT SHOULD BE REPORTED IMMEDIATELY: *FEVER GREATER THAN 100.4 F (38 C) OR HIGHER *CHILLS OR SWEATING *NAUSEA AND VOMITING THAT IS NOT CONTROLLED WITH YOUR NAUSEA MEDICATION *UNUSUAL SHORTNESS OF BREATH *UNUSUAL BRUISING OR BLEEDING *URINARY PROBLEMS (pain or burning when urinating, or frequent urination) *BOWEL PROBLEMS (unusual diarrhea, constipation, pain near the anus) TENDERNESS IN MOUTH AND THROAT WITH OR WITHOUT PRESENCE OF ULCERS (sore throat, sores in mouth, or a toothache) UNUSUAL RASH, SWELLING OR PAIN  UNUSUAL VAGINAL DISCHARGE OR ITCHING   Items with * indicate a potential emergency and should be followed up as soon as possible or go to the Emergency Department if any problems should occur.  Please show the CHEMOTHERAPY ALERT CARD or IMMUNOTHERAPY  ALERT CARD at check-in to the Emergency Department and triage nurse.  Should you have questions after your visit or need to cancel or reschedule your appointment, please contact [facility] - A DEPT OF [facility]  Dept: [phone]  and follow the prompts.  Office hours are 8:00 a.m. to 4:30 p.m. Monday - Friday. Please note that voicemails left after 4:00 p.m. may not be returned until the following business day.  We are closed weekends and major holidays. You have access to a nurse at all times for urgent questions. Please call the main number to the clinic Dept: [phone] and follow the prompts.   For any non-urgent questions, you may also contact your provider using MyChart. We now offer e-Visits for anyone 37 and older to request care online for non-urgent symptoms. For details visit [email or web address].   Also download the MyChart app! Go to the app store, search "MyChart", open the app, select [facility], and log in with your MyChart username and password.  Paclitaxel Injection What is this medication? PACLITAXEL (PAK li TAX el) treats some types of cancer. It works by slowing down the growth of cancer cells. This medicine may be used for other purposes; ask your health care provider or pharmacist if you have questions. COMMON BRAND NAME(S): Onxol, Taxol What should I tell my care team before I take this medication? They need to know if you have any of these conditions: Heart disease Liver disease Low white blood cell levels An unusual or allergic reaction to paclitaxel, other medications, foods,  dyes, or preservatives If you or your partner are pregnant or trying to get pregnant Breast-feeding How should I use this medication? This medication is injected into a vein. It is given by your care team in a hospital or clinic setting. Talk to your care team about the use of this medication in children. While it may be given to children for selected  conditions, precautions do apply. Overdosage: If you think you have taken too much of this medicine contact a poison control center or emergency room at once. NOTE: This medicine is only for you. Do not share this medicine with others. What if I miss a dose? Keep appointments for follow-up doses. It is important not to miss your dose. Call your care team if you are unable to keep an appointment. What may interact with this medication? Do not take this medication with any of the following: Live virus vaccines Other medications may affect the way this medication works. Talk with your care team about all of the medications you take. They may suggest changes to your treatment plan to lower the risk of side effects and to make sure your medications work as intended. This list may not describe all possible interactions. Give your health care provider a list of all the medicines, herbs, non-prescription drugs, or dietary supplements you use. Also tell them if you smoke, drink alcohol, or use illegal drugs. Some items may interact with your medicine. What should I watch for while using this medication? Your condition will be monitored carefully while you are receiving this medication. You may need blood work while taking this medication. This medication may make you feel generally unwell. This is not uncommon as chemotherapy can affect healthy cells as well as cancer cells. Report any side effects. Continue your course of treatment even though you feel ill unless your care team tells you to stop. This medication can cause serious allergic reactions. To reduce the risk, your care team may give you other medications to take before receiving this one. Be sure to follow the directions from your care team. This medication may increase your risk of getting an infection. Call your care team for advice if you get a fever, chills, sore throat, or other symptoms of a cold or flu. Do not treat yourself. Try to avoid  being around people who are sick. This medication may increase your risk to bruise or bleed. Call your care team if you notice any unusual bleeding. Be careful brushing or flossing your teeth or using a toothpick because you may get an infection or bleed more easily. If you have any dental work done, tell your dentist you are receiving this medication. Talk to your care team if you may be pregnant. Serious birth defects can occur if you take this medication during pregnancy. Talk to your care team before breastfeeding. Changes to your treatment plan may be needed. What side effects may I notice from receiving this medication? Side effects that you should report to your care team as soon as possible: Allergic reactions--skin rash, itching, hives, swelling of the face, lips, tongue, or throat Heart rhythm changes--fast or irregular heartbeat, dizziness, feeling faint or lightheaded, chest pain, trouble breathing Increase in blood pressure Infection--fever, chills, cough, sore throat, wounds that don't heal, pain or trouble when passing urine, general feeling of discomfort or being unwell Low blood pressure--dizziness, feeling faint or lightheaded, blurry vision Low red blood cell level--unusual weakness or fatigue, dizziness, headache, trouble breathing Painful swelling, warmth, or redness of  the skin, blisters or sores at the infusion site Pain, tingling, or numbness in the hands or feet Slow heartbeat--dizziness, feeling faint or lightheaded, confusion, trouble breathing, unusual weakness or fatigue Unusual bruising or bleeding Side effects that usually do not require medical attention (report to your care team if they continue or are bothersome): Diarrhea Hair loss Joint pain Loss of appetite Muscle pain Nausea Vomiting This list may not describe all possible side effects. Call your doctor for medical advice about side effects. You may report side effects to FDA at 1-800-FDA-1088. Where  should I keep my medication? This medication is given in a hospital or clinic. It will not be stored at home. NOTE: This sheet is a summary. It may not cover all possible information. If you have questions about this medicine, talk to your doctor, pharmacist, or health care provider.  2024 Elsevier/Gold Standard (2021-08-19 00:00:00)

## 2024-04-18 ENCOUNTER — Other Ambulatory Visit: Payer: Self-pay

## 2024-04-18 ENCOUNTER — Inpatient Hospital Stay (HOSPITAL_BASED_OUTPATIENT_CLINIC_OR_DEPARTMENT_OTHER): Admitting: Adult Health

## 2024-04-18 ENCOUNTER — Encounter: Payer: Self-pay | Admitting: Adult Health

## 2024-04-18 ENCOUNTER — Inpatient Hospital Stay: Attending: Hematology and Oncology

## 2024-04-18 ENCOUNTER — Encounter: Payer: Self-pay | Admitting: *Deleted

## 2024-04-18 ENCOUNTER — Inpatient Hospital Stay

## 2024-04-18 VITALS — BP 120/73 | HR 63 | Temp 98.1°F | Resp 18 | Wt 125.3 lb

## 2024-04-18 VITALS — BP 125/84 | HR 73 | Temp 99.4°F | Resp 16

## 2024-04-18 DIAGNOSIS — Z170 Estrogen receptor positive status [ER+]: Secondary | ICD-10-CM | POA: Insufficient documentation

## 2024-04-18 DIAGNOSIS — Z79633 Long term (current) use of mitotic inhibitor: Secondary | ICD-10-CM | POA: Insufficient documentation

## 2024-04-18 DIAGNOSIS — C50411 Malignant neoplasm of upper-outer quadrant of right female breast: Secondary | ICD-10-CM

## 2024-04-18 DIAGNOSIS — Z5111 Encounter for antineoplastic chemotherapy: Secondary | ICD-10-CM | POA: Insufficient documentation

## 2024-04-18 LAB — CBC WITH DIFFERENTIAL (CANCER CENTER ONLY)
Abs Immature Granulocytes: 0.03 K/uL (ref 0.00–0.07)
Basophils Absolute: 0.1 K/uL (ref 0.0–0.1)
Basophils Relative: 2 %
Eosinophils Absolute: 0.2 K/uL (ref 0.0–0.5)
Eosinophils Relative: 5 %
HCT: 30.1 % — ABNORMAL LOW (ref 36.0–46.0)
Hemoglobin: 10.3 g/dL — ABNORMAL LOW (ref 12.0–15.0)
Immature Granulocytes: 1 %
Lymphocytes Relative: 19 %
Lymphs Abs: 0.6 K/uL — ABNORMAL LOW (ref 0.7–4.0)
MCH: 31.6 pg (ref 26.0–34.0)
MCHC: 34.2 g/dL (ref 30.0–36.0)
MCV: 92.3 fL (ref 80.0–100.0)
Monocytes Absolute: 0.4 K/uL (ref 0.1–1.0)
Monocytes Relative: 12 %
Neutro Abs: 2 K/uL (ref 1.7–7.7)
Neutrophils Relative %: 61 %
Platelet Count: 239 K/uL (ref 150–400)
RBC: 3.26 MIL/uL — ABNORMAL LOW (ref 3.87–5.11)
RDW: 15.9 % — ABNORMAL HIGH (ref 11.5–15.5)
WBC Count: 3.3 K/uL — ABNORMAL LOW (ref 4.0–10.5)
nRBC: 0 % (ref 0.0–0.2)

## 2024-04-18 LAB — CMP (CANCER CENTER ONLY)
ALT: 17 U/L (ref 0–44)
AST: 29 U/L (ref 15–41)
Albumin: 4.3 g/dL (ref 3.5–5.0)
Alkaline Phosphatase: 65 U/L (ref 38–126)
Anion gap: 9 (ref 5–15)
BUN: 12 mg/dL (ref 6–20)
CO2: 25 mmol/L (ref 22–32)
Calcium: 9.2 mg/dL (ref 8.9–10.3)
Chloride: 105 mmol/L (ref 98–111)
Creatinine: 0.51 mg/dL (ref 0.44–1.00)
GFR, Estimated: >60 mL/min
Glucose, Bld: 111 mg/dL — ABNORMAL HIGH (ref 70–99)
Potassium: 3.7 mmol/L (ref 3.5–5.1)
Sodium: 139 mmol/L (ref 135–145)
Total Bilirubin: 0.5 mg/dL (ref 0.0–1.2)
Total Protein: 6.3 g/dL — ABNORMAL LOW (ref 6.5–8.1)

## 2024-04-18 MED ORDER — DIPHENHYDRAMINE HCL 50 MG/ML IJ SOLN
50.0000 mg | Freq: Once | INTRAMUSCULAR | Status: AC
  Administered 2024-04-18: 50 mg via INTRAVENOUS
  Filled 2024-04-18: qty 1

## 2024-04-18 MED ORDER — SODIUM CHLORIDE 0.9 % IV SOLN
10.0000 mg | Freq: Once | INTRAVENOUS | Status: AC
  Administered 2024-04-18: 10 mg via INTRAVENOUS
  Filled 2024-04-18: qty 10

## 2024-04-18 MED ORDER — SODIUM CHLORIDE 0.9 % IV SOLN
80.0000 mg/m2 | Freq: Once | INTRAVENOUS | Status: AC
  Administered 2024-04-18: 126 mg via INTRAVENOUS
  Filled 2024-04-18: qty 21

## 2024-04-18 MED ORDER — FAMOTIDINE IN NACL 20-0.9 MG/50ML-% IV SOLN
20.0000 mg | Freq: Once | INTRAVENOUS | Status: DC
  Filled 2024-04-18: qty 50

## 2024-04-18 MED ORDER — SODIUM CHLORIDE 0.9% FLUSH: 10.0000 mL | INTRAVENOUS | Status: DC | PRN

## 2024-04-18 MED ORDER — SODIUM CHLORIDE 0.9 % IV SOLN: INTRAVENOUS | Status: DC

## 2024-04-18 NOTE — Progress Notes (Signed)
 [facility] Cancer Follow up:    Melinda Pellet, MD [phone] [address] [place] [address]   DIAGNOSIS: Cancer Staging  Malignant neoplasm of upper-outer quadrant of right breast in female, estrogen receptor positive (HCC) Staging form: Breast, AJCC 8th Edition - Pathologic stage from 12/29/2023: Stage IB (pT2, pN56mi(sn), cM0, G2, ER+, PR+, HER2-) - Signed by [name], PA-C on 12/29/2023 Stage prefix: Initial diagnosis Method of lymph node assessment: Sentinel lymph node biopsy Histologic grading system: 3 grade system    SUMMARY OF ONCOLOGIC HISTORY: Oncology History  Malignant neoplasm of upper-outer quadrant of right breast in female, estrogen receptor positive (HCC)  12/09/2023 Initial Diagnosis   Palpable mass in the right breast measured 1.6 cm by mammogram and ultrasound.  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, margins negative, 1/3 lymph node with micrometastatic disease, anterior margin excision: Focus of IDC grade 2 with DCIS final margins negative   12/29/2023 Cancer Staging   Staging form: Breast, AJCC 8th Edition - Pathologic stage from 12/29/2023: Stage IB (pT2, pN31mi(sn), cM0, G2, ER+, PR+, HER2-) - Signed by [name], PA-C on 12/29/2023 Stage prefix: Initial diagnosis Method of lymph node assessment: Sentinel lymph node biopsy Histologic grading system: 3 grade system   01/25/2024 Genetic Testing   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 The report date is 01/25/2024.  The CancerNext-Expanded gene panel offered by [facility] and includes sequencing, rearrangement, and RNA analysis for the following 77 genes: AIP, ALK, APC, ATM, AXIN2, BAP1, BARD1, BMPR1A, BRCA1, BRCA2, BRIP1, CDC73, CDH1, CDK4, CDKN1B, CDKN2A, CEBPA, CHEK2,  CTNNA1, DDX41, DICER1, ETV6, FH, FLCN, GATA2, LZTR1, MAX, MBD4, MEN1, MET, MLH1, MSH2, MSH3, MSH6, MUTYH, NF1, NF2, NTHL1, PALB2, PHOX2B, PMS2, POT1, PRKAR1A, PTCH1, PTEN, RAD51C, RAD51D, RB1, RET, RPS20, RUNX1, SDHA, SDHAF2, SDHB, SDHC, SDHD, SMAD4, SMARCA4, SMARCB1, SMARCE1, STK11, SUFU, TMEM127, TP53, TSC1, TSC2, VHL, and WT1 (sequencing and deletion/duplication); EGFR, HOXB13, KIT, MITF, PDGFRA, POLD1, and POLE (sequencing only); EPCAM and GREM1 (deletion/duplication only).    01/28/2024 -  Chemotherapy   Patient is on Treatment Plan : BREAST DOSE DENSE AC q14d / PACLitaxel  q7d       CURRENT THERAPY: Taxol   INTERVAL HISTORY:  Discussed the use of AI scribe software for clinical note transcription with the patient, who gave verbal consent to proceed.  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  She completed four cycles of Adriamycin  and Cytoxan  and is receiving adjuvant Taxol , scheduled for her fourth dose, which she tolerates better than Adriamycin .  She has significant fatigue requiring daily naps, which she attributes to anemia and chemotherapy. Her blood counts were low last week but have since improved.  Over the past week she has had intermittent chest pain, including nocturnal and morning episodes, similar to prior anxiety episodes and partially relieved by an extra dose of Buspar. She denies palpitations and notes exertional dyspnea with walking or climbing stairs that began after starting chemotherapy. Home heart rate monitoring is normal.  She developed new facial paresthesia over the past week, present today. She is awaiting laboratory results including electrolytes and calcium.  She was seen by her primary care provider yesterday  for shortness of breath, declined emergency  room evaluation, and remains worried about possible chemotherapy-related cardiotoxicity given prior doxorubicin  exposure.     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   Malignant neoplasm of upper-outer quadrant of right breast in female, estrogen receptor positive (HCC) 12/20/2023   Impacted cerumen of left ear 12/07/2023   Chronic rhinitis 01/19/2023   Other specified disorders of eustachian tube, left ear 01/19/2023   Precordial pain 06/04/2022   Pericardial effusion 08/08/2020   Atypical chest pain 08/08/2020   History of bronchitis    Elevated blood pressure reading    Chronic throat clearing    Hypokalemia 12/01/2019   Abscess 12/01/2019   Proctocolitis with abscess 11/30/2019   Pain in left hip 10/27/2018   History of recurrent UTIs 01/06/2012   Hypothyroidism    GERD (gastroesophageal reflux disease)     is allergic to codeine, morphine  sulfate, and other.  MEDICAL HISTORY: Past Medical History:  Diagnosis Date   Breast cancer (HCC) 11/2023   Right   Bursitis left hip   Chronic throat clearing    ENT eval 2008 by Dr. Ethyl showed normal vocal cords and  slight edema of the arytenoid mucosa possibly indicative of globus type sx's and possible reflux symptoms.   Complication of anesthesia    COVID 03/04/2020   congestion loss of taste and smell x 7 days, took monoclonal antibody infusion   GERD (gastroesophageal reflux disease)    omeprazole  daily helps   Hashimoto's thyroiditis    History of bronchitis    Hypothyroidism    managed by endocrinologist ([name])   Pelvic congestion syndrome    on left side   Perforation bowel (HCC)    colon perforation 11-30-2019 hospitialized, and march 2022   Pericardial effusion    PONV (postoperative nausea and vomiting)    nausea   Wears glasses    for reading    SURGICAL HISTORY: Past Surgical History:  Procedure  Laterality Date   BREAST BIOPSY Right 12/09/2023   US  RT BREAST BX W LOC DEV 1ST LESION IMG BX SPEC US  GUIDE 12/09/2023 [facility]   BREAST BIOPSY  12/21/2023   US  RT RADIOACTIVE SEED LOC 12/21/2023 [facility]MO[facility]   BREAST LUMPECTOMY WITH RADIOACTIVE SEED AND SENTINEL LYMPH NODE BIOPSY Right 12/23/2023   Procedure: RIGHT BREAST LUMPECTOMY WITH RADIOACTIVE SEED AND SENTINEL LYMPH NODE BIOPSY;  Surgeon: [name], MD;  Location: [facility];  Service: General;  Laterality: Right;  RIGHT BREAST RADIOACTIVE SEED LOCALIZED LUMPECTOMYSENTINEL NODE BIOPSY; Right arm restriction   CESAREAN SECTION  1988   first pregnancy.  Her second delivery was vaginal.   colonscopy  01/2020   x 3   CYST EXCISION  1999   Epidermal inclusion cyst in right groin (Dr. Mikell)   GROIN DEBRIDEMENT  1999   cyst removed   IR IMAGING GUIDED PORT INSERTION  01/27/2024   LAPAROSCOPY N/A 08/22/2020   Procedure: LAPAROSCOPY OPERATIVE, LYSIS OF ADHESIONS;  Surgeon: [name], MD;  Location:  SURGERY CENTER;  Service: Gynecology;  Laterality: N/A;   MYRINGOTOMY WITH TUBE PLACEMENT Left 08/2022    SOCIAL HISTORY: Social History   Socioeconomic History   Marital status: Married    Spouse name: Not on file   Number of children: 3   Years of education: Not on file   Highest education level: Not on file  Occupational History  Not on file  Tobacco Use   Smoking status: Never    Passive exposure: Past   Smokeless tobacco: Never  Vaping Use   Vaping status: Never Used  Substance and Sexual Activity   Alcohol use: No   Drug use: No   Sexual activity: Not on file  Other Topics Concern   Not on file  Social History Narrative   Divorced, engaged.     Three grown children.   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  Bachelor's degree from [place]  state.     She is originally from [place].   No T/A/Ds.   Exercise: walks a lot (1-2 times per week).   She tries to eat  healthy.   Social Drivers of Health   Tobacco Use: Low Risk (01/28/2024)   Patient History    Smoking Tobacco Use: Never    Smokeless Tobacco Use: Never    Passive Exposure: Past  Financial Resource Strain: Not on file  Food Insecurity: No Food Insecurity (12/30/2023)   Epic    Worried About Programme Researcher, Broadcasting/film/video in the Last Year: Never true    Ran Out of Food in the Last Year: Never true  Transportation Needs: No Transportation Needs (12/30/2023)   Epic    Lack of Transportation (Medical): No    Lack of Transportation (Non-Medical): No  Physical Activity: Not on file  Stress: Not on file  Social Connections: Unknown (05/15/2022)   Received from [facility]   Social Network    Social Network: Not on file  Intimate Partner Violence: Not At Risk (12/30/2023)   Epic    Fear of Current or Ex-Partner: No    Emotionally Abused: No    Physically Abused: No    Sexually Abused: No  Depression (PHQ2-9): Low Risk (01/28/2024)   Depression (PHQ2-9)    PHQ-2 Score: 0  Alcohol Screen: Not on file  Housing: Low Risk (12/30/2023)   Epic    Unable to Pay for Housing in the Last Year: No    Number of Times Moved in the Last Year: 0    Homeless in the Last Year: No  Utilities: Not At Risk (12/30/2023)   Epic    Threatened with loss of utilities: No  Health Literacy: Not on file    FAMILY HISTORY: Family History  Problem Relation Age of Onset   Diabetes Mother    Breast cancer Mother        dx 40s   Heart disease Father        first MI age 68   Hypertension Father    Cancer Father        myelodysplastic metaplasia   Breast cancer Paternal Aunt        dx 3 times, in 42s initially   Lung cancer Paternal Aunt    Cervical cancer Paternal Aunt 37   Breast cancer Maternal Grandmother 35   Bone cancer Paternal Grandfather     Review of Systems  Constitutional:  Negative for appetite change, chills, fatigue, fever and unexpected weight change.  HENT:   Negative for hearing loss,  lump/mass and trouble swallowing.   Eyes:  Negative for eye problems and icterus.  Respiratory:  Negative for chest tightness, cough and shortness of breath.   Cardiovascular:  Negative for chest pain, leg swelling and palpitations.  Gastrointestinal:  Negative for abdominal distention, abdominal pain, constipation, diarrhea, nausea and vomiting.  Endocrine: Negative for hot flashes.  Genitourinary:  Negative for difficulty urinating.  Musculoskeletal:  Negative for arthralgias.  Skin:  Negative for itching and rash.  Neurological:  Negative for dizziness, extremity weakness, headaches and numbness.  Hematological:  Negative for adenopathy. Does not bruise/bleed easily.  Psychiatric/Behavioral:  Negative for depression. The patient is not nervous/anxious.       PHYSICAL EXAMINATION    Vitals:   04/18/24 0935  BP: 120/73  Pulse: 63  Resp: 18  Temp: 98.1 F (36.7 C)  SpO2: 100%    Physical Exam Constitutional:      General: She is not in acute distress.    Appearance: Normal appearance. She is not toxic-appearing.  HENT:     Head: Normocephalic and atraumatic.     Mouth/Throat:     Mouth: Mucous membranes are moist.     Pharynx: Oropharynx is clear. No oropharyngeal exudate or posterior oropharyngeal erythema.  Eyes:     General: No scleral icterus. Cardiovascular:     Rate and Rhythm: Normal rate and regular rhythm.     Pulses: Normal pulses.     Heart sounds: Normal heart sounds.  Pulmonary:     Effort: Pulmonary effort is normal.     Breath sounds: Normal breath sounds.  Abdominal:     General: Abdomen is flat. Bowel sounds are normal. There is no distension.     Palpations: Abdomen is soft.     Tenderness: There is no abdominal tenderness.  Musculoskeletal:        General: No swelling.     Cervical back: Neck supple.  Lymphadenopathy:     Cervical: No cervical adenopathy.  Skin:    General: Skin is warm and dry.     Findings: No rash.  Neurological:      General: No focal deficit present.     Mental Status: She is alert.  Psychiatric:        Mood and Affect: Mood normal.        Behavior: Behavior normal.     LABORATORY DATA:  CBC    Component Value Date/Time   WBC 3.9 (L) 04/11/2024 0953   WBC 6.0 08/19/2020 0851   RBC 3.17 (L) 04/11/2024 0953   HGB 9.9 (L) 04/11/2024 0953   HCT 29.0 (L) 04/11/2024 0953   PLT 207 04/11/2024 0953   MCV 91.5 04/11/2024 0953   MCH 31.2 04/11/2024 0953   MCHC 34.1 04/11/2024 0953   RDW 17.2 (H) 04/11/2024 0953   LYMPHSABS 0.6 (L) 04/11/2024 0953   MONOABS 0.4 04/11/2024 0953   EOSABS 0.1 04/11/2024 0953   BASOSABS 0.1 04/11/2024 0953    CMP     Component Value Date/Time   NA 141 04/11/2024 0953   K 3.5 04/11/2024 0953   CL 107 04/11/2024 0953   CO2 24 04/11/2024 0953   GLUCOSE 127 (H) 04/11/2024 0953   BUN 13 04/11/2024 0953   CREATININE 0.53 04/11/2024 0953   CALCIUM 9.0 04/11/2024 0953   PROT 6.2 (L) 04/11/2024 0953   ALBUMIN 4.2 04/11/2024 0953   AST 22 04/11/2024 0953   ALT 10 04/11/2024 0953   ALKPHOS 69 04/11/2024 0953   BILITOT 0.3 04/11/2024 0953   GFRNONAA >60 04/11/2024 0953   GFRAA >60 12/03/2019 0300     ASSESSMENT and THERAPY PLAN:    Assessment and Plan Assessment & Plan Stage I ER/PR positive right breast invasive ductal carcinoma She is receiving adjuvant chemotherapy with Adriamycin , cyclophosphamide , and paclitaxel .  - Due for Paclitaxel  today and will proceed with therapy. - Labs reviewed within parameters  Chemotherapy-induced anemia Hemoglobin improved to 10.3 g/dL. Significant fatigue reported, but blood counts remain within safe parameters for continued chemotherapy. - Monitored hemoglobin and blood counts. - Assessed eligibility for ongoing chemotherapy based on current blood counts.  Facial Paresthesia New onset facial paresthesia reported, possibly related to chemotherapy or anxiety, without weakness or other neurological deficits. Electrolyte  results pending. - Awaited electrolyte panel results to assess for metabolic contributors.  Chest pain under evaluation for chemotherapy cardiotoxicity Intermittent chest pain reported, primarily at rest and occasionally nocturnal, with prior normal cardiac workup and known anxiety. Aware of Adriamycin -induced cardiotoxicity risk. EKG today was reassuring. - Performed EKG in clinic. - Scheduled echocardiogram for further cardiac assessment. - Provided guidance to monitor blood pressure and heart rate at home. - Advised to seek emergency care if chest pain worsens or is accompanied by concerning symptoms.   RTC in 1 week for labs, f/u, and next treatment.    All questions were answered. The patient knows to call the clinic with any problems, questions or concerns. We can certainly see the patient much sooner if necessary.  Total encounter time:30 minutes*in face-to-face visit time, chart review, lab review, care coordination, order entry, and documentation of the encounter time.    Morna Kendall, NP 04/18/2024 9:36 AM Medical Oncology and Hematology [facility] [address] [place], [place] [address] Tel. [phone]    Fax. [phone]  *Total Encounter Time as defined by the Centers for Medicare and Medicaid Services includes, in addition to the face-to-face time of a patient visit (documented in the note above) non-face-to-face time: obtaining and reviewing outside history, ordering and reviewing medications, tests or procedures, care coordination (communications with other health care professionals or caregivers) and documentation in the medical record.

## 2024-04-18 NOTE — Patient Instructions (Signed)
 [facility] - A DEPT OF [facility]  Discharge Instructions: Thank you for choosing [facility] to provide your oncology and hematology care.   If you have a lab appointment with the Cancer Center, please go directly to the Cancer Center and check in at the registration area.   Wear comfortable clothing and clothing appropriate for easy access to any Portacath or PICC line.   We strive to give you quality time with your provider. You may need to reschedule your appointment if you arrive late (15 or more minutes).  Arriving late affects you and other patients whose appointments are after yours.  Also, if you miss three or more appointments without notifying the office, you may be dismissed from the clinic at the provider's discretion.      For prescription refill requests, have your pharmacy contact our office and allow 72 hours for refills to be completed.    Today you received the following chemotherapy and/or immunotherapy agent: Paclitaxel  (Taxol ).      To help prevent nausea and vomiting after your treatment, we encourage you to take your nausea medication as directed.  BELOW ARE SYMPTOMS THAT SHOULD BE REPORTED IMMEDIATELY: *FEVER GREATER THAN 100.4 F (38 C) OR HIGHER *CHILLS OR SWEATING *NAUSEA AND VOMITING THAT IS NOT CONTROLLED WITH YOUR NAUSEA MEDICATION *UNUSUAL SHORTNESS OF BREATH *UNUSUAL BRUISING OR BLEEDING *URINARY PROBLEMS (pain or burning when urinating, or frequent urination) *BOWEL PROBLEMS (unusual diarrhea, constipation, pain near the anus) TENDERNESS IN MOUTH AND THROAT WITH OR WITHOUT PRESENCE OF ULCERS (sore throat, sores in mouth, or a toothache) UNUSUAL RASH, SWELLING OR PAIN  UNUSUAL VAGINAL DISCHARGE OR ITCHING   Items with * indicate a potential emergency and should be followed up as soon as possible or go to the Emergency Department if any problems should occur.  Please show the CHEMOTHERAPY ALERT CARD or  IMMUNOTHERAPY ALERT CARD at check-in to the Emergency Department and triage nurse.  Should you have questions after your visit or need to cancel or reschedule your appointment, please contact [facility] - A DEPT OF [facility]  Dept: [phone]  and follow the prompts.  Office hours are 8:00 a.m. to 4:30 p.m. Monday - Friday. Please note that voicemails left after 4:00 p.m. may not be returned until the following business day.  We are closed weekends and major holidays. You have access to a nurse at all times for urgent questions. Please call the main number to the clinic Dept: [phone] and follow the prompts.   For any non-urgent questions, you may also contact your provider using MyChart. We now offer e-Visits for anyone 56 and older to request care online for non-urgent symptoms. For details visit [email or web address].   Also download the MyChart app! Go to the app store, search "MyChart", open the app, select [place], and log in with your MyChart username and password.

## 2024-04-20 ENCOUNTER — Encounter: Payer: Self-pay | Admitting: Hematology and Oncology

## 2024-04-20 ENCOUNTER — Other Ambulatory Visit: Payer: Self-pay

## 2024-04-20 ENCOUNTER — Ambulatory Visit: Payer: Self-pay | Admitting: Licensed Clinical Social Worker

## 2024-04-20 DIAGNOSIS — Z1379 Encounter for other screening for genetic and chromosomal anomalies: Secondary | ICD-10-CM

## 2024-04-20 DIAGNOSIS — Z1589 Genetic susceptibility to other disease: Secondary | ICD-10-CM

## 2024-04-20 NOTE — Progress Notes (Signed)
 Genetic Testing - [id number]  HPI:   Melinda Forbes was previously seen in the [facility] due to a personal and family history of breast cancer and concerns regarding a hereditary predisposition to cancer. Please refer to our prior cancer genetics clinic note for more information regarding our discussion, assessment and recommendations, at the time. Ms. Thackston recent genetic test results were disclosed to her, as were recommendations warranted by these results. These results and recommendations are discussed in more detail below.  CANCER HISTORY:  Oncology History  Malignant neoplasm of upper-outer quadrant of right breast in female, estrogen receptor positive (HCC)  12/09/2023 Initial Diagnosis   Palpable mass in the right breast measured 1.6 cm by mammogram and ultrasound.  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, margins negative, 1/3 lymph node with micrometastatic disease, anterior margin excision: Focus of IDC grade 2 with DCIS final margins negative   12/29/2023 Cancer Staging   Staging form: Breast, AJCC 8th Edition - Pathologic stage from 12/29/2023: Stage IB (pT2, pN36mi(sn), cM0, G2, ER+, PR+, HER2-) - Signed by [name], PA-C on 12/29/2023 Stage prefix: Initial diagnosis Method of lymph node assessment: Sentinel lymph node biopsy Histologic grading system: 3 grade system   01/25/2024 Genetic Testing   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 The report date is 01/25/2024.  The CancerNext-Expanded gene panel offered by [facility] and includes sequencing, rearrangement, and RNA analysis for the following 77 genes: AIP, ALK, APC, ATM, AXIN2, BAP1, BARD1, BMPR1A, BRCA1, BRCA2, BRIP1, CDC73, CDH1, CDK4, CDKN1B, CDKN2A, CEBPA, CHEK2, CTNNA1, DDX41, DICER1, ETV6, FH, FLCN,  GATA2, LZTR1, MAX, MBD4, MEN1, MET, MLH1, MSH2, MSH3, MSH6, MUTYH, NF1, NF2, NTHL1, PALB2, PHOX2B, PMS2, POT1, PRKAR1A, PTCH1, PTEN, RAD51C, RAD51D, RB1, RET, RPS20, RUNX1, SDHA, SDHAF2, SDHB, SDHC, SDHD, SMAD4, SMARCA4, SMARCB1, SMARCE1, STK11, SUFU, TMEM127, TP53, TSC1, TSC2, VHL, and WT1 (sequencing and deletion/duplication); EGFR, HOXB13, KIT, MITF, PDGFRA, POLD1, and POLE (sequencing only); EPCAM and GREM1 (deletion/duplication only).    01/28/2024 -  Chemotherapy   Patient is on Treatment Plan : BREAST DOSE DENSE AC q14d / PACLitaxel  q7d       FAMILY HISTORY:  We obtained a detailed, 4-generation family history.  Significant diagnoses are listed below: Family History  Problem Relation Age of Onset   Diabetes Mother    Breast cancer Mother        dx 22s   Heart disease Father        first MI age 52   Hypertension Father    Cancer Father        myelodysplastic metaplasia   Breast cancer Paternal Aunt        dx 3 times, in 55s initially   Lung cancer Paternal Aunt    Cervical cancer Paternal Aunt 8   Breast cancer Maternal Grandmother 30   Bone cancer Paternal Grandfather    Ms. Markiewicz has 2 daughters and 1 son, no cancers.   Ms. Hayman mother had breast cancer in her 34s and is living at 70. Maternal grandmother had breast cancer and passed of it at 16.    MS. Dabney's father had technogenic myelodypslastic metaplasia and passed at 41. A paternal aunt had breast cancer 3 times, the first time in her 13s. Paternal aunt had lung cancer in her 51s. Another aunt had cervical  cancer and passed at 37. A paternal cousin had NH lymphoma.    Ms. Lieder is unaware of previous family history of genetic testing for hereditary cancer risks. There is no reported Ashkenazi Jewish ancestry. There is no known consanguinity.        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 Specifically, this variant is p.L155* (c.464T>A).  No  other pathogenic variants were detected in the CancerNext-Expanded+RNA panel.   The CancerNext-Expanded gene panel offered by [facility] and includes sequencing, rearrangement, and RNA analysis for the following 77 genes: AIP, ALK, APC, ATM, AXIN2, BAP1, BARD1, BMPR1A, BRCA1, BRCA2, BRIP1, CDC73, CDH1, CDK4, CDKN1B, CDKN2A, CEBPA, CHEK2, CTNNA1, DDX41, DICER1, ETV6, FH, FLCN, GATA2, LZTR1, MAX, MBD4, MEN1, MET, MLH1, MSH2, MSH3, MSH6, MUTYH, NF1, NF2, NTHL1, PALB2, PHOX2B, PMS2, POT1, PRKAR1A, PTCH1, PTEN, RAD51C, RAD51D, RB1, RET, RPS20, RUNX1, SDHA, SDHAF2, SDHB, SDHC, SDHD, SMAD4, SMARCA4, SMARCB1, SMARCE1, STK11, SUFU, TMEM127, TP53, TSC1, TSC2, VHL, and WT1 (sequencing and deletion/duplication); EGFR, HOXB13, KIT, MITF, PDGFRA, POLD1, and POLE (sequencing only); EPCAM and GREM1 (deletion/duplication only).   The test report will be scanned into EPIC and is located under the Molecular Pathology section of the Results Review tab.  A portion of the result report is included below for reference. Genetic testing reported out on 01/25/2024.     Of note, this result does not explain the personal and family history of cancer in Ms. Limones's family. Even though a pathogenic variant explaining the cancer in Ms. Krouse her family/was not identified, possible explanations for the cancer in the family may include: The cancers in Ms. Bezanson and/or her family may be sporadic/familial or due to other genetic and environmental factors. There may be a gene mutation in one of these genes that current testing methods cannot detect but that chance is small. There could be another gene that has not yet been discovered, or that we have not yet tested, that is responsible for the cancer diagnoses in the family.  It is also possible there is a hereditary cause for the cancer in the family that Ms. Scallan did not inherit.   TMEM127 Clinical Information:  A deleterious variant in the TMEM127 gene is associated with  hereditary paraganglioma and pheochromocytoma (PGL-PCC) syndrome.  The lifetime risk of PGL-PCC tumors associated with TMEM127 is not clear.  PGL-PCC tumors are typically noncancerous, but sometimes develop into cancer or can cause symptoms based on location and/or excess hormones they produce.   Pheochromocytomas have been reported as the most frequent tumor type associated with TMEM127; however, paragangliomas and some cases of clear cell renal cell carcinoma have been reported.  Data suggests that ~25% of the time, TMEM127-associated tumors are bilateral.  Metastatic risk for TMEM127 associated tumors is thought to be low (PMID: 79698284) .    Management Recommendations: (NCCN Neuroendocrine and Adrenal Tumors V3.2025) Blood pressure monitoring at all medical visits starting at age 65-15 Screening blood test for plasma-free metanephrines or 24-hour urine test for fractionated metanephrines every year starting at age 29-15 Cross-sectional imaging of skull base to pelvis every 2-3 years starting at age 84-15 by whole body MRI (if available) or other non-radiation containing imaging procedures If whole body MRI not available, may consider abdnominal MRI, skull base and neck MRI, and chest CT Education about signs and symptoms of PGL and PCC tumors and prompt reporting of symptoms Symptoms of these tumors may include a lump that gets bigger over time,  hearing loss, ringing in the ears, difficulty swallowing, hoarseness of the voice, problems moving the tongue, unexplained sweating, headaches, heart palpitations, paleness, and/or feelings of anxiety or panic Treatment of PGL and PCC may include surgery and/or other treatment Due to risk of bilateral tumors, consideration should be given to cortical-sparing adrenalectomy Blood and/or urine screening for tumors prior to any surgical procedures Complete screening should be performed prior to planning a pregnancy.   The above recommendations are based on  all genes associated with Heredtiary PGL-PCC syndromes and may be modified based on personal/family history factors or gene penetrance.   This information is based on current understanding of the gene and may change in the future.   Implications for Family Members: Hereditary predisposition to cancer due to pathogenic variants in the TMEM127 gene has autosomal dominant inheritance. This means that an individual's first degree relatives (parents, full siblings, children) have a 50% chance of having the same pathogenic variant.  More distant relatives also have an increased chance of having this pathogenic variant. Identification of a pathogenic variant allows for the recognition of at-risk relatives who can pursue testing for the familial variant.  Family members are encouraged to consider genetic testing for this familial pathogenic variant.  They may contact our office at [phone] for more information or to schedule an appointment.  Family members who live outside of the area are encouraged to find a genetic counselor in their area by visiting: [email or web address].  Resources: [employer]: www.pheopara.org  PLAN: Ms. Doukas would like to be referred back to her endocrinologist she has seen in the past, [name] at [facility]. Referral faxed today.  Ms. Boyko plans to discuss these results with her family and will reach out to us  if we can be of any assistance in coordinating genetic testing for any of her relatives.   Our contact number was provided. Ms. Counce questions were answered to her satisfaction, and she knows she is welcome to call us  at anytime with additional questions or concerns.    Dena Cary, MS, [facility] Genetic Counselor [email or web address] Phone: [phone]

## 2024-04-20 NOTE — Telephone Encounter (Signed)
 Called patient to review TMEM127 gene/guidelines. Referring to her previous endocrinologist, Dr. Balan, for management.   Melinda Cary, MS, [facility] Genetic Counselor [email or web address] Phone: [phone]

## 2024-04-21 ENCOUNTER — Other Ambulatory Visit: Payer: Self-pay

## 2024-04-24 MED FILL — Dexamethasone Sodium Phosphate Inj 100 MG/10ML: INTRAMUSCULAR | Qty: 1 | Status: AC

## 2024-04-25 ENCOUNTER — Inpatient Hospital Stay: Admitting: Hematology and Oncology

## 2024-04-25 ENCOUNTER — Inpatient Hospital Stay

## 2024-04-25 VITALS — BP 128/76 | HR 67 | Temp 97.7°F | Resp 17 | Wt 123.5 lb

## 2024-04-25 DIAGNOSIS — C50411 Malignant neoplasm of upper-outer quadrant of right female breast: Secondary | ICD-10-CM

## 2024-04-25 DIAGNOSIS — Z170 Estrogen receptor positive status [ER+]: Secondary | ICD-10-CM | POA: Diagnosis not present

## 2024-04-25 LAB — CBC WITH DIFFERENTIAL (CANCER CENTER ONLY)
Abs Immature Granulocytes: 0.03 K/uL (ref 0.00–0.07)
Basophils Absolute: 0.1 K/uL (ref 0.0–0.1)
Basophils Relative: 1 %
Eosinophils Absolute: 0.1 K/uL (ref 0.0–0.5)
Eosinophils Relative: 4 %
HCT: 32 % — ABNORMAL LOW (ref 36.0–46.0)
Hemoglobin: 10.9 g/dL — ABNORMAL LOW (ref 12.0–15.0)
Immature Granulocytes: 1 %
Lymphocytes Relative: 19 %
Lymphs Abs: 0.7 K/uL (ref 0.7–4.0)
MCH: 31.4 pg (ref 26.0–34.0)
MCHC: 34.1 g/dL (ref 30.0–36.0)
MCV: 92.2 fL (ref 80.0–100.0)
Monocytes Absolute: 0.4 K/uL (ref 0.1–1.0)
Monocytes Relative: 10 %
Neutro Abs: 2.5 K/uL (ref 1.7–7.7)
Neutrophils Relative %: 65 %
Platelet Count: 208 K/uL (ref 150–400)
RBC: 3.47 MIL/uL — ABNORMAL LOW (ref 3.87–5.11)
RDW: 14.7 % (ref 11.5–15.5)
WBC Count: 3.8 K/uL — ABNORMAL LOW (ref 4.0–10.5)
nRBC: 0 % (ref 0.0–0.2)

## 2024-04-25 LAB — CMP (CANCER CENTER ONLY)
ALT: 11 U/L (ref 0–44)
AST: 22 U/L (ref 15–41)
Albumin: 4.3 g/dL (ref 3.5–5.0)
Alkaline Phosphatase: 67 U/L (ref 38–126)
Anion gap: 11 (ref 5–15)
BUN: 11 mg/dL (ref 6–20)
CO2: 25 mmol/L (ref 22–32)
Calcium: 9.3 mg/dL (ref 8.9–10.3)
Chloride: 103 mmol/L (ref 98–111)
Creatinine: 0.52 mg/dL (ref 0.44–1.00)
GFR, Estimated: >60 mL/min
Glucose, Bld: 105 mg/dL — ABNORMAL HIGH (ref 70–99)
Potassium: 4.1 mmol/L (ref 3.5–5.1)
Sodium: 138 mmol/L (ref 135–145)
Total Bilirubin: 0.4 mg/dL (ref 0.0–1.2)
Total Protein: 6.5 g/dL (ref 6.5–8.1)

## 2024-04-25 MED ORDER — SODIUM CHLORIDE 0.9 % IV SOLN
10.0000 mg | Freq: Once | INTRAVENOUS | Status: AC
  Administered 2024-04-25: 10 mg via INTRAVENOUS
  Filled 2024-04-25: qty 10

## 2024-04-25 MED ORDER — SODIUM CHLORIDE 0.9 % IV SOLN
80.0000 mg/m2 | Freq: Once | INTRAVENOUS | Status: AC
  Administered 2024-04-25: 126 mg via INTRAVENOUS
  Filled 2024-04-25: qty 21

## 2024-04-25 MED ORDER — FAMOTIDINE IN NACL 20-0.9 MG/50ML-% IV SOLN
20.0000 mg | Freq: Once | INTRAVENOUS | Status: AC
  Administered 2024-04-25: 20 mg via INTRAVENOUS
  Filled 2024-04-25: qty 50

## 2024-04-25 MED ORDER — SODIUM CHLORIDE 0.9 % IV SOLN: INTRAVENOUS | Status: DC

## 2024-04-25 MED ORDER — DIPHENHYDRAMINE HCL 50 MG/ML IJ SOLN
50.0000 mg | Freq: Once | INTRAMUSCULAR | Status: AC
  Administered 2024-04-25: 50 mg via INTRAVENOUS
  Filled 2024-04-25: qty 1

## 2024-04-25 NOTE — Progress Notes (Signed)
 "  Patient Care Team: [name], MD as PCP - General (Family Medicine) [name], MD as Consulting Physician (Endocrinology) [name], MD (Inactive) as Consulting Physician (Plastic Surgery) [name], MD as Consulting Physician (General Surgery) [name], RN as Oncology Nurse Navigator [name], Devere HERO, RN as Oncology Nurse Navigator Odean Potts, MD as Consulting Physician (Hematology and Oncology) [name], MD as Consulting Physician (General Surgery) [name], MD as Consulting Physician (Radiation Oncology)  DIAGNOSIS:  Encounter Diagnosis  Name Primary?   Malignant neoplasm of upper-outer quadrant of right breast in female, estrogen receptor positive (HCC) Yes    SUMMARY OF ONCOLOGIC HISTORY: Oncology History  Malignant neoplasm of upper-outer quadrant of right breast in female, estrogen receptor positive (HCC)  12/09/2023 Initial Diagnosis   Palpable mass in the right breast measured 1.6 cm by mammogram and ultrasound.  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, margins negative, 1/3 lymph node with micrometastatic disease, anterior margin excision: Focus of IDC grade 2 with DCIS final margins negative   12/29/2023 Cancer Staging   Staging form: Breast, AJCC 8th Edition - Pathologic stage from 12/29/2023: Stage IB (pT2, pN53mi(sn), cM0, G2, ER+, PR+, HER2-) - Signed by [name], PA-C on 12/29/2023 Stage prefix: Initial diagnosis Method of lymph node assessment: Sentinel lymph node biopsy Histologic grading system: 3 grade system   01/25/2024 Genetic Testing   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 The report date is 01/25/2024.  The CancerNext-Expanded gene panel offered by [facility] and includes sequencing, rearrangement, and RNA  analysis for the following 77 genes: AIP, ALK, APC, ATM, AXIN2, BAP1, BARD1, BMPR1A, BRCA1, BRCA2, BRIP1, CDC73, CDH1, CDK4, CDKN1B, CDKN2A, CEBPA, CHEK2, CTNNA1, DDX41, DICER1, ETV6, FH, FLCN, GATA2, LZTR1, MAX, MBD4, MEN1, MET, MLH1, MSH2, MSH3, MSH6, MUTYH, NF1, NF2, NTHL1, PALB2, PHOX2B, PMS2, POT1, PRKAR1A, PTCH1, PTEN, RAD51C, RAD51D, RB1, RET, RPS20, RUNX1, SDHA, SDHAF2, SDHB, SDHC, SDHD, SMAD4, SMARCA4, SMARCB1, SMARCE1, STK11, SUFU, TMEM127, TP53, TSC1, TSC2, VHL, and WT1 (sequencing and deletion/duplication); EGFR, HOXB13, KIT, MITF, PDGFRA, POLD1, and POLE (sequencing only); EPCAM and GREM1 (deletion/duplication only).    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  She notes significant fatigue with paclitaxel  but prefers this to the prior severe nausea with Adriamycin /Cytoxan . She denies current nausea or vomiting. Appetite is poor with persistent metallic dysgeusia, but she maintains oral intake and is increasing red meat intake to support hemoglobin.  She uses cold therapy during infusions and has no numbness, tingling, difficulty with fine motor tasks, or altered sensation in her feet.  Her hemoglobin has improved from 9.9 to 10.9 without transfusion. She is focusing on diet to support further improvement. White blood cell and platelet counts have remained stable. She uses a mask in public for infection precautions.  Last week she had precordial chest pain radiating to the back that  sometimes woke her  from sleep. She relates this to significant psychosocial stress and notes prior similar episodes with unremarkable cardiac workup. She recently had an EKG and has an echocardiogram scheduled. She currently has no chest pain, dyspnea, or other cardiac symptoms.  She reports ongoing severe emotional stress from legal proceedings and public scrutiny, which she feels is affecting her well-being.       ALLERGIES:  is allergic to codeine, morphine  sulfate, and other.  MEDICATIONS:  Current Outpatient Medications  Medication Sig Dispense Refill   ALPRAZolam (XANAX) 0.5 MG tablet Take 0.5 mg by mouth at bedtime as needed for anxiety.     busPIRone HCl (BUSPAR PO) Take by mouth.     Calcium Carbonate-Vit D-Min (CALCIUM 1200 PO) Take 1 tablet by mouth at bedtime.      Cranberry 125 MG TABS Take by mouth.     docusate sodium (COLACE) 100 MG capsule Take 100 mg by mouth 2 (two) times daily.     esomeprazole  (NEXIUM ) 40 MG capsule Take 1 capsule (40 mg total) by mouth daily at 12 noon. 90 capsule 1   famotidine  (PEPCID ) 20 MG tablet Take 20 mg by mouth at bedtime.     fluticasone  (FLONASE ) 50 MCG/ACT nasal spray Place 2 sprays into both nostrils daily. 16 g 10   levothyroxine  (SYNTHROID ) 88 MCG tablet Take 1 tablet (88 mcg total) by mouth daily before breakfast.     lidocaine -prilocaine  (EMLA ) cream Apply to affected area once 30 g 3   Multiple Vitamin (MULTIVITAMIN) tablet Take 1 tablet by mouth daily.     Naproxen Sod-diphenhydrAMINE  (ALEVE PM PO) Take 80 mg by mouth daily.     ondansetron  (ZOFRAN ) 8 MG tablet TAKE 1 TAB BY MOUTH EVERY 8 HRS AS NEEDED FOR NAUSEA/VOMITING. START THIRD DAY AFTER DOXORUBICIN /CYCLOPHOSPHAMIDE  CHEMOTHERAPY. (Patient not taking: Reported on 04/11/2024) 30 tablet 1   prochlorperazine  (COMPAZINE ) 10 MG tablet Take 1 tablet (10 mg total) by mouth every 6 (six) hours as needed for nausea or vomiting. 30 tablet 1   promethazine  (PHENERGAN ) 12.5 MG tablet Take  1 tablet (12.5 mg total) by mouth every 6 (six) hours as needed for nausea or vomiting. 30 tablet 1   Psyllium Fiber 0.52 g CAPS Take 0.52 g by mouth daily.     RESTASIS 0.05 % ophthalmic emulsion Place into both eyes.     sucralfate  (CARAFATE ) 1 g tablet Take 1 tablet (1 g total) by mouth 4 (four) times daily -  with meals and at bedtime. 120 tablet 1   Zinc 50 MG TABS Take by mouth.     No current facility-administered medications for this visit.    PHYSICAL EXAMINATION: ECOG PERFORMANCE STATUS: 1 - Symptomatic but completely ambulatory  Vitals:   04/25/24 1011  BP: 128/76  Pulse: 67  Resp: 17  Temp: 97.7 F (36.5 C)  SpO2: 99%   Filed Weights   04/25/24 1011  Weight: 123 lb 8 oz (56 kg)      LABORATORY DATA:  I have reviewed the data as listed    Latest Ref Rng & Units 04/18/2024    9:11 AM 04/11/2024    9:53 AM 04/04/2024   11:21 AM  CMP  Glucose 70 - 99 mg/dL 888  872  892   BUN 6 - 20 mg/dL 12  13  11    Creatinine 0.44 - 1.00 mg/dL 9.48  9.46  9.53   Sodium 135 - 145 mmol/L 139  141  138   Potassium 3.5 - 5.1  mmol/L 3.7  3.5  3.7   Chloride 98 - 111 mmol/L 105  107  103   CO2 22 - 32 mmol/L 25  24  25    Calcium 8.9 - 10.3 mg/dL 9.2  9.0  8.9   Total Protein 6.5 - 8.1 g/dL 6.3  6.2  6.5   Total Bilirubin 0.0 - 1.2 mg/dL 0.5  0.3  0.4   Alkaline Phos 38 - 126 U/L 65  69  81   AST 15 - 41 U/L 29  22  26    ALT 0 - 44 U/L 17  10  16      Lab Results  Component Value Date   WBC 3.8 (L) 04/25/2024   HGB 10.9 (L) 04/25/2024   HCT 32.0 (L) 04/25/2024   MCV 92.2 04/25/2024   PLT 208 04/25/2024   NEUTROABS 2.5 04/25/2024    ASSESSMENT & PLAN:  Malignant neoplasm of upper-outer quadrant of right breast in female, estrogen receptor positive (HCC) 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, anterior margin excision: Focus of IDC grade 2 with DCIS final margins negative   Oncotype DX: Recurrence score 31 (18% risk of recurrence)   01/05/2024: CT CAP and bone scan: Simple cysts in the liver largest 1.4 cm, several sub-5 mm ill-defined foci too small to characterize, few prominent right axillary lymph nodes, seroma right axilla 4.2 cm, ER 100%, PR 10%, HER2 1+ negative, Ki67 20%   Treatment plan: Adjuvant chemotherapy with dose dense Adriamycin  and Cytoxan  x 4 followed by Taxol  x 12 Adjuvant radiation therapy Antiestrogen therapy ----------------------------------------------------------------------------------------------------------------------------------------- Current treatment: Completed 4 cycles of dose dense Adriamycin  and Cytoxan , today cycle 5 Taxol  Chemo toxicities: Chemo-induced nausea and vomiting: She continued to have nausea and vomiting in spite of lowering the dosage.  She will try taking alprazolam during the daytime to reduce the nausea for those 2 days. Fatigue Thrombocytopenia: Recovered Mild anemia secondary to chemotherapy: Being watched and monitored   Return to clinic weekly for Taxol  treatments      No orders of the defined types were placed in this encounter.  The patient has a good understanding of the overall plan. she agrees with it. she will call with any problems that may develop before the next visit here.  I personally spent a total of 30 minutes in the care of the patient today including preparing to see the patient, getting/reviewing separately obtained history, performing a medically appropriate exam/evaluation, counseling and educating, placing orders, referring and communicating with other health care professionals, documenting clinical information in the EHR, independently interpreting results, communicating results, and coordinating care.   Viinay K Deaundra Dupriest, MD 04/25/2024    "

## 2024-04-25 NOTE — Assessment & Plan Note (Signed)
 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, anterior margin excision: Focus of IDC grade 2 with DCIS final margins negative  Oncotype DX: Recurrence score 31 (18% risk of recurrence)   01/05/2024: CT CAP and bone scan: Simple cysts in the liver largest 1.4 cm, several sub-5 mm ill-defined foci too small to characterize, few prominent right axillary lymph nodes, seroma right axilla 4.2 cm, ER 100%, PR 10%, HER2 1+ negative, Ki67 20%   Treatment plan: Adjuvant chemotherapy with dose dense Adriamycin  and Cytoxan  x 4 followed by Taxol  x 12 Adjuvant radiation therapy Antiestrogen therapy ----------------------------------------------------------------------------------------------------------------------------------------- Current treatment: Completed 4 cycles of dose dense Adriamycin  and Cytoxan , today cycle 5 Taxol  Chemo toxicities: Chemo-induced nausea and vomiting: She continued to have nausea and vomiting in spite of lowering the dosage.  She will try taking alprazolam during the daytime to reduce the nausea for those 2 days. Fatigue Thrombocytopenia: Recovered Mild anemia secondary to chemotherapy: Being watched and monitored   Return to clinic weekly for Taxol  treatments

## 2024-04-25 NOTE — Patient Instructions (Signed)
 [facility] - A DEPT OF [facility]  Discharge Instructions: Thank you for choosing [facility] to provide your oncology and hematology care.   If you have a lab appointment with the Cancer Center, please go directly to the Cancer Center and check in at the registration area.   Wear comfortable clothing and clothing appropriate for easy access to any Portacath or PICC line.   We strive to give you quality time with your provider. You may need to reschedule your appointment if you arrive late (15 or more minutes).  Arriving late affects you and other patients whose appointments are after yours.  Also, if you miss three or more appointments without notifying the office, you may be dismissed from the clinic at the provider's discretion.      For prescription refill requests, have your pharmacy contact our office and allow 72 hours for refills to be completed.    Today you received the following chemotherapy and/or immunotherapy agents: PACLitaxel (TAXOL)    To help prevent nausea and vomiting after your treatment, we encourage you to take your nausea medication as directed.  BELOW ARE SYMPTOMS THAT SHOULD BE REPORTED IMMEDIATELY: *FEVER GREATER THAN 100.4 F (38 C) OR HIGHER *CHILLS OR SWEATING *NAUSEA AND VOMITING THAT IS NOT CONTROLLED WITH YOUR NAUSEA MEDICATION *UNUSUAL SHORTNESS OF BREATH *UNUSUAL BRUISING OR BLEEDING *URINARY PROBLEMS (pain or burning when urinating, or frequent urination) *BOWEL PROBLEMS (unusual diarrhea, constipation, pain near the anus) TENDERNESS IN MOUTH AND THROAT WITH OR WITHOUT PRESENCE OF ULCERS (sore throat, sores in mouth, or a toothache) UNUSUAL RASH, SWELLING OR PAIN  UNUSUAL VAGINAL DISCHARGE OR ITCHING   Items with * indicate a potential emergency and should be followed up as soon as possible or go to the Emergency Department if any problems should occur.  Please show the CHEMOTHERAPY ALERT CARD or  IMMUNOTHERAPY ALERT CARD at check-in to the Emergency Department and triage nurse.  Should you have questions after your visit or need to cancel or reschedule your appointment, please contact [facility] - A DEPT [facility]  Dept: [phone]  and follow the prompts.  Office hours are 8:00 a.m. to 4:30 p.m. Monday - Friday. Please note that voicemails left after 4:00 p.m. may not be returned until the following business day.  We are closed weekends and major holidays. You have access to a nurse at all times for urgent questions. Please call the main number to the clinic Dept: [phone] and follow the prompts.   For any non-urgent questions, you may also contact your provider using MyChart. We now offer e-Visits for anyone 5 and older to request care online for non-urgent symptoms. For details visit [email or web address].   Also download the MyChart app! Go to the app store, search "MyChart", open the app, select [facility], and log in with your MyChart username and password.

## 2024-04-26 ENCOUNTER — Other Ambulatory Visit: Payer: Self-pay

## 2024-04-27 ENCOUNTER — Other Ambulatory Visit: Payer: Self-pay

## 2024-04-28 ENCOUNTER — Ambulatory Visit (HOSPITAL_COMMUNITY)
Admission: RE | Admit: 2024-04-28 | Discharge: 2024-04-28 | Disposition: A | Discharge status: Home / Self Care | Source: Ambulatory Visit | Attending: Hematology and Oncology | Admitting: Hematology and Oncology

## 2024-04-28 DIAGNOSIS — Z0189 Encounter for other specified special examinations: Secondary | ICD-10-CM

## 2024-04-28 DIAGNOSIS — I3139 Other pericardial effusion (noninflammatory): Secondary | ICD-10-CM | POA: Insufficient documentation

## 2024-04-28 DIAGNOSIS — Z170 Estrogen receptor positive status [ER+]: Secondary | ICD-10-CM | POA: Diagnosis not present

## 2024-04-28 DIAGNOSIS — R0789 Other chest pain: Secondary | ICD-10-CM | POA: Diagnosis not present

## 2024-04-28 DIAGNOSIS — Z01818 Encounter for other preprocedural examination: Secondary | ICD-10-CM | POA: Insufficient documentation

## 2024-04-28 DIAGNOSIS — C50411 Malignant neoplasm of upper-outer quadrant of right female breast: Secondary | ICD-10-CM | POA: Insufficient documentation

## 2024-04-28 LAB — ECHOCARDIOGRAM COMPLETE
Area-P 1/2: 3.21 cm2
Calc EF: 74.4 %
S' Lateral: 2.5 cm
Single Plane A2C EF: 75 %
Single Plane A4C EF: 75.7 %

## 2024-04-28 NOTE — Progress Notes (Signed)
" °  Echocardiogram 2D Echocardiogram has been performed.  Devora Ellouise SAUNDERS 04/28/2024, 1:44 PM "

## 2024-05-01 ENCOUNTER — Ambulatory Visit: Payer: Self-pay

## 2024-05-01 MED FILL — Dexamethasone Sodium Phosphate Inj 100 MG/10ML: INTRAMUSCULAR | Qty: 1 | Status: AC

## 2024-05-02 ENCOUNTER — Encounter: Payer: Self-pay | Admitting: Adult Health

## 2024-05-02 ENCOUNTER — Inpatient Hospital Stay

## 2024-05-02 ENCOUNTER — Inpatient Hospital Stay: Admitting: Adult Health

## 2024-05-02 VITALS — BP 148/85 | HR 63 | Temp 97.7°F | Resp 17 | Wt 124.2 lb

## 2024-05-02 VITALS — BP 134/91 | HR 62 | Resp 16

## 2024-05-02 DIAGNOSIS — Z170 Estrogen receptor positive status [ER+]: Secondary | ICD-10-CM | POA: Diagnosis not present

## 2024-05-02 DIAGNOSIS — C50411 Malignant neoplasm of upper-outer quadrant of right female breast: Secondary | ICD-10-CM | POA: Diagnosis not present

## 2024-05-02 LAB — CBC WITH DIFFERENTIAL (CANCER CENTER ONLY)
Abs Immature Granulocytes: 0.02 K/uL (ref 0.00–0.07)
Basophils Absolute: 0.1 K/uL (ref 0.0–0.1)
Basophils Relative: 2 %
Eosinophils Absolute: 0.2 K/uL (ref 0.0–0.5)
Eosinophils Relative: 5 %
HCT: 30.8 % — ABNORMAL LOW (ref 36.0–46.0)
Hemoglobin: 10.6 g/dL — ABNORMAL LOW (ref 12.0–15.0)
Immature Granulocytes: 1 %
Lymphocytes Relative: 21 %
Lymphs Abs: 0.7 K/uL (ref 0.7–4.0)
MCH: 31.6 pg (ref 26.0–34.0)
MCHC: 34.4 g/dL (ref 30.0–36.0)
MCV: 91.9 fL (ref 80.0–100.0)
Monocytes Absolute: 0.4 K/uL (ref 0.1–1.0)
Monocytes Relative: 12 %
Neutro Abs: 2 K/uL (ref 1.7–7.7)
Neutrophils Relative %: 59 %
Platelet Count: 190 K/uL (ref 150–400)
RBC: 3.35 MIL/uL — ABNORMAL LOW (ref 3.87–5.11)
RDW: 13.8 % (ref 11.5–15.5)
WBC Count: 3.4 K/uL — ABNORMAL LOW (ref 4.0–10.5)
nRBC: 0 % (ref 0.0–0.2)

## 2024-05-02 LAB — CMP (CANCER CENTER ONLY)
ALT: 9 U/L (ref 0–44)
AST: 21 U/L (ref 15–41)
Albumin: 4.5 g/dL (ref 3.5–5.0)
Alkaline Phosphatase: 66 U/L (ref 38–126)
Anion gap: 10 (ref 5–15)
BUN: 10 mg/dL (ref 8–23)
CO2: 24 mmol/L (ref 22–32)
Calcium: 9.2 mg/dL (ref 8.9–10.3)
Chloride: 105 mmol/L (ref 98–111)
Creatinine: 0.5 mg/dL (ref 0.44–1.00)
GFR, Estimated: >60 mL/min
Glucose, Bld: 112 mg/dL — ABNORMAL HIGH (ref 70–99)
Potassium: 3.7 mmol/L (ref 3.5–5.1)
Sodium: 140 mmol/L (ref 135–145)
Total Bilirubin: 0.4 mg/dL (ref 0.0–1.2)
Total Protein: 6.5 g/dL (ref 6.5–8.1)

## 2024-05-02 MED ORDER — SODIUM CHLORIDE 0.9 % IV SOLN: INTRAVENOUS | Status: DC

## 2024-05-02 MED ORDER — DIPHENHYDRAMINE HCL 50 MG/ML IJ SOLN
50.0000 mg | Freq: Once | INTRAMUSCULAR | Status: AC
  Administered 2024-05-02: 50 mg via INTRAVENOUS
  Filled 2024-05-02: qty 1

## 2024-05-02 MED ORDER — SODIUM CHLORIDE 0.9 % IV SOLN
80.0000 mg/m2 | Freq: Once | INTRAVENOUS | Status: AC
  Administered 2024-05-02: 126 mg via INTRAVENOUS
  Filled 2024-05-02: qty 21

## 2024-05-02 MED ORDER — FAMOTIDINE IN NACL 20-0.9 MG/50ML-% IV SOLN
20.0000 mg | Freq: Once | INTRAVENOUS | Status: AC
  Administered 2024-05-02: 20 mg via INTRAVENOUS
  Filled 2024-05-02: qty 50

## 2024-05-02 MED ORDER — SODIUM CHLORIDE 0.9 % IV SOLN
10.0000 mg | Freq: Once | INTRAVENOUS | Status: AC
  Administered 2024-05-02: 10 mg via INTRAVENOUS
  Filled 2024-05-02: qty 10

## 2024-05-02 NOTE — Patient Instructions (Signed)

## 2024-05-02 NOTE — Progress Notes (Unsigned)
 [facility] Cancer Follow up:    [name], MD [phone] [address] [place] [address]   DIAGNOSIS: Cancer Staging  Malignant neoplasm of upper-outer quadrant of right breast in female, estrogen receptor positive (HCC) Staging form: Breast, AJCC 8th Edition - Pathologic stage from 12/29/2023: Stage IB (pT2, pN48mi(sn), cM0, G2, ER+, PR+, HER2-) - Signed by [name], PA-C on 12/29/2023 Stage prefix: Initial diagnosis Method of lymph node assessment: Sentinel lymph node biopsy Histologic grading system: 3 grade system    SUMMARY OF ONCOLOGIC HISTORY: Oncology History  Malignant neoplasm of upper-outer quadrant of right breast in female, estrogen receptor positive (HCC)  12/09/2023 Initial Diagnosis   Palpable mass in the right breast measured 1.6 cm by mammogram and ultrasound.  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, margins negative, 1/3 lymph node with micrometastatic disease, anterior margin excision: Focus of IDC grade 2 with DCIS final margins negative   12/29/2023 Cancer Staging   Staging form: Breast, AJCC 8th Edition - Pathologic stage from 12/29/2023: Stage IB (pT2, pN84mi(sn), cM0, G2, ER+, PR+, HER2-) - Signed by [name], PA-C on 12/29/2023 Stage prefix: Initial diagnosis Method of lymph node assessment: Sentinel lymph node biopsy Histologic grading system: 3 grade system   01/25/2024 Genetic Testing   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 The report date is 01/25/2024.  The CancerNext-Expanded gene panel offered by [facility] and includes sequencing, rearrangement, and RNA analysis for the following 77 genes: AIP, ALK, APC, ATM, AXIN2, BAP1, BARD1, BMPR1A, BRCA1, BRCA2, BRIP1, CDC73, CDH1, CDK4, CDKN1B, CDKN2A, CEBPA, CHEK2,  CTNNA1, DDX41, DICER1, ETV6, FH, FLCN, GATA2, LZTR1, MAX, MBD4, MEN1, MET, MLH1, MSH2, MSH3, MSH6, MUTYH, NF1, NF2, NTHL1, PALB2, PHOX2B, PMS2, POT1, PRKAR1A, PTCH1, PTEN, RAD51C, RAD51D, RB1, RET, RPS20, RUNX1, SDHA, SDHAF2, SDHB, SDHC, SDHD, SMAD4, SMARCA4, SMARCB1, SMARCE1, STK11, SUFU, TMEM127, TP53, TSC1, TSC2, VHL, and WT1 (sequencing and deletion/duplication); EGFR, HOXB13, KIT, MITF, PDGFRA, POLD1, and POLE (sequencing only); EPCAM and GREM1 (deletion/duplication only).    01/28/2024 -  Chemotherapy   Patient is on Treatment Plan : BREAST DOSE DENSE AC q14d / PACLitaxel  q7d       CURRENT THERAPY: weekly taxol  #6  INTERVAL HISTORY:  Discussed the use of AI scribe software for clinical note transcription with the patient, who gave verbal consent to proceed.  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  She was diagnosed in August 2025 and underwent lumpectomy, and is receiving adjuvant chemotherapy with adriamycin  and cyclophosphamide . She has completed four cycles and is planned for a total of twelve.  She has persistent fatigue with decreased energy compared to baseline but remains able to perform housework and daily activities. She has dysgeusia with a metallic taste to all foods, which she attributes to chemotherapy. She denies peripheral neuropathy, nausea, vomiting, and gastrointestinal or genitourinary symptoms.  Recent cardiac evaluation was normal.     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   Malignant neoplasm of upper-outer quadrant of right breast in female, estrogen receptor positive (HCC) 12/20/2023   Impacted  cerumen of left ear 12/07/2023   Chronic rhinitis 01/19/2023   Other  specified disorders of eustachian tube, left ear 01/19/2023   Precordial pain 06/04/2022   Pericardial effusion 08/08/2020   Atypical chest pain 08/08/2020   History of bronchitis    Elevated blood pressure reading    Chronic throat clearing    Hypokalemia 12/01/2019   Abscess 12/01/2019   Proctocolitis with abscess 11/30/2019   Pain in left hip 10/27/2018   History of recurrent UTIs 01/06/2012   Hypothyroidism    GERD (gastroesophageal reflux disease)     is allergic to codeine, morphine  sulfate, and other.  MEDICAL HISTORY: Past Medical History:  Diagnosis Date   Breast cancer (HCC) 11/2023   Right   Bursitis left hip   Chronic throat clearing    ENT eval 2008 by Dr. Ethyl showed normal vocal cords and  slight edema of the arytenoid mucosa possibly indicative of globus type sx's and possible reflux symptoms.   Complication of anesthesia    COVID 03/04/2020   congestion loss of taste and smell x 7 days, took monoclonal antibody infusion   GERD (gastroesophageal reflux disease)    omeprazole  daily helps   Hashimoto's thyroiditis    History of bronchitis    Hypothyroidism    managed by endocrinologist ([name])   Pelvic congestion syndrome    on left side   Perforation bowel (HCC)    colon perforation 11-30-2019 hospitialized, and march 2022   Pericardial effusion    PONV (postoperative nausea and vomiting)    nausea   Wears glasses    for reading    SURGICAL HISTORY: Past Surgical History:  Procedure Laterality Date   BREAST BIOPSY Right 12/09/2023   US  RT BREAST BX W LOC DEV 1ST LESION IMG BX SPEC US  GUIDE 12/09/2023 [facility]   BREAST BIOPSY  12/21/2023   US  RT RADIOACTIVE SEED LOC 12/21/2023 [facility]MO[facility]   BREAST LUMPECTOMY WITH RADIOACTIVE SEED AND SENTINEL LYMPH NODE BIOPSY Right 12/23/2023   Procedure: RIGHT BREAST LUMPECTOMY WITH RADIOACTIVE SEED AND SENTINEL LYMPH NODE BIOPSY;  Surgeon: [name], MD;  Location: [facility];  Service: General;  Laterality: Right;  RIGHT BREAST RADIOACTIVE SEED LOCALIZED LUMPECTOMYSENTINEL NODE BIOPSY; Right arm restriction   CESAREAN SECTION  1988   first pregnancy.  Her second delivery was vaginal.   colonscopy  01/2020   x 3   CYST EXCISION  1999   Epidermal inclusion cyst in right groin (Dr. Mikell)   GROIN DEBRIDEMENT  1999   cyst removed   IR IMAGING GUIDED PORT INSERTION  01/27/2024   LAPAROSCOPY N/A 08/22/2020   Procedure: LAPAROSCOPY OPERATIVE, LYSIS OF ADHESIONS;  Surgeon: [name], MD;  Location: [facility];  Service: Gynecology;  Laterality: N/A;   MYRINGOTOMY WITH TUBE PLACEMENT Left 08/2022    SOCIAL HISTORY: Social History   Socioeconomic History   Marital status: Married    Spouse name: Not on file   Number of children: 3   Years of education: Not on file   Highest education level: Not on file  Occupational History   Not on file  Tobacco Use   Smoking status: Never    Passive exposure: Past   Smokeless tobacco: Never  Vaping Use   Vaping status: Never Used  Substance and Sexual Activity   Alcohol use: No   Drug use: No   Sexual activity: Not on file  Other Topics Concern   Not on file  Social History Narrative   Divorced, engaged.     Three grown children.   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  Bachelor's degree from [place]  state.     She is originally from [place].   No T/A/Ds.   Exercise: walks a lot (1-2 times per week).   She tries to eat healthy.   Social Drivers of Health   Tobacco Use: Low Risk (05/02/2024)   Patient History    Smoking Tobacco Use: Never    Smokeless Tobacco Use: Never    Passive Exposure: Past  Financial Resource Strain: Low Risk (05/02/2024)   Overall Financial Resource Strain (CARDIA)    Difficulty of Paying Living Expenses: Not hard at all  Food Insecurity: No Food Insecurity (05/02/2024)   Epic    Worried About Programme Researcher, Broadcasting/film/video in the Last Year: Never true    Ran  Out of Food in the Last Year: Never true  Transportation Needs: No Transportation Needs (05/02/2024)   Epic    Lack of Transportation (Medical): No    Lack of Transportation (Non-Medical): No  Physical Activity: Sufficiently Active (05/02/2024)   Exercise Vital Sign    Days of Exercise per Week: 7 days    Minutes of Exercise per Session: 40 min  Recent Concern: Physical Activity - Insufficiently Active (04/18/2024)   Exercise Vital Sign    Days of Exercise per Week: 3 days    Minutes of Exercise per Session: 30 min  Stress: No Stress Concern Present (05/02/2024)   [employer] of Occupational Health - Occupational Stress Questionnaire    Feeling of Stress: Not at all  Social Connections: Socially Integrated (05/02/2024)   Social Connection and Isolation Panel    Frequency of Communication with Friends and Family: More than three times a week    Frequency of Social Gatherings with Friends and Family: More than three times a week    Attends Religious Services: More than 4 times per year    Active Member of Clubs or Organizations: Yes    Attends Banker Meetings: More than 4 times per year    Marital Status: Married  Catering Manager Violence: Not At Risk (05/02/2024)   Epic    Fear of Current or Ex-Partner: No    Emotionally Abused: No    Physically Abused: No    Sexually Abused: No  Depression (PHQ2-9): Low Risk (05/02/2024)   Depression (PHQ2-9)    PHQ-2 Score: 0  Alcohol Screen: Low Risk (05/02/2024)   Alcohol Screen    Last Alcohol Screening Score (AUDIT): 0  Housing: Unknown (05/02/2024)   Epic    Unable to Pay for Housing in the Last Year: No    Number of Times Moved in the Last Year: Not on file    Homeless in the Last Year: No  Utilities: Not At Risk (05/02/2024)   Epic    Threatened with loss of utilities: No  Health Literacy: Adequate Health Literacy (05/02/2024)   B1300 Health Literacy    Frequency of need for help with medical instructions: Never     FAMILY HISTORY: Family History  Problem Relation Age of Onset   Diabetes Mother    Breast cancer Mother        dx 4s   Heart disease Father        first MI age 10   Hypertension Father    Cancer Father        myelodysplastic metaplasia   Breast cancer Paternal Aunt  dx 3 times, in 16s initially   Lung cancer Paternal Aunt    Cervical cancer Paternal Aunt 37   Breast cancer Maternal Grandmother 35   Bone cancer Paternal Grandfather     Review of Systems  Constitutional:  Positive for fatigue. Negative for appetite change, chills, fever and unexpected weight change.  HENT:   Negative for hearing loss, lump/mass and trouble swallowing.   Eyes:  Negative for eye problems and icterus.  Respiratory:  Negative for chest tightness, cough and shortness of breath.   Cardiovascular:  Negative for chest pain, leg swelling and palpitations.  Gastrointestinal:  Negative for abdominal distention, abdominal pain, constipation, diarrhea, nausea and vomiting.  Endocrine: Negative for hot flashes.  Genitourinary:  Negative for difficulty urinating.   Musculoskeletal:  Negative for arthralgias.  Skin:  Negative for itching and rash.  Neurological:  Negative for dizziness, extremity weakness, headaches and numbness.  Hematological:  Negative for adenopathy. Does not bruise/bleed easily.  Psychiatric/Behavioral:  Negative for depression. The patient is not nervous/anxious.       PHYSICAL EXAMINATION    Vitals:   05/02/24 1141  BP: (!) 148/85  Pulse: 63  Resp: 17  Temp: 97.7 F (36.5 C)  SpO2: 100%    Physical Exam Constitutional:      General: She is not in acute distress.    Appearance: Normal appearance. She is not toxic-appearing.  HENT:     Head: Normocephalic and atraumatic.     Mouth/Throat:     Mouth: Mucous membranes are moist.     Pharynx: Oropharynx is clear. No oropharyngeal exudate or posterior oropharyngeal erythema.  Eyes:     General: No scleral  icterus. Cardiovascular:     Rate and Rhythm: Normal rate and regular rhythm.     Pulses: Normal pulses.     Heart sounds: Normal heart sounds.  Pulmonary:     Effort: Pulmonary effort is normal.     Breath sounds: Normal breath sounds.  Abdominal:     General: Abdomen is flat. Bowel sounds are normal. There is no distension.     Palpations: Abdomen is soft.     Tenderness: There is no abdominal tenderness.  Musculoskeletal:        General: No swelling.     Cervical back: Neck supple.  Lymphadenopathy:     Cervical: No cervical adenopathy.  Skin:    General: Skin is warm and dry.     Findings: No rash.  Neurological:     General: No focal deficit present.     Mental Status: She is alert.  Psychiatric:        Mood and Affect: Mood normal.        Behavior: Behavior normal.     LABORATORY DATA:  CBC    Component Value Date/Time   WBC 3.4 (L) 05/02/2024 1121   WBC 6.0 08/19/2020 0851   RBC 3.35 (L) 05/02/2024 1121   HGB 10.6 (L) 05/02/2024 1121   HCT 30.8 (L) 05/02/2024 1121   PLT 190 05/02/2024 1121   MCV 91.9 05/02/2024 1121   MCH 31.6 05/02/2024 1121   MCHC 34.4 05/02/2024 1121   RDW 13.8 05/02/2024 1121   LYMPHSABS 0.7 05/02/2024 1121   MONOABS 0.4 05/02/2024 1121   EOSABS 0.2 05/02/2024 1121   BASOSABS 0.1 05/02/2024 1121    CMP     Component Value Date/Time   NA 138 04/25/2024 0954   K 4.1 04/25/2024 0954   CL 103 04/25/2024 0954   CO2  25 04/25/2024 0954   GLUCOSE 105 (H) 04/25/2024 0954   BUN 11 04/25/2024 0954   CREATININE 0.52 04/25/2024 0954   CALCIUM 9.3 04/25/2024 0954   PROT 6.5 04/25/2024 0954   ALBUMIN 4.3 04/25/2024 0954   AST 22 04/25/2024 0954   ALT 11 04/25/2024 0954   ALKPHOS 67 04/25/2024 0954   BILITOT 0.4 04/25/2024 0954   GFRNONAA >60 04/25/2024 0954   GFRAA >60 12/03/2019 0300     ASSESSMENT and THERAPY PLAN:    Assessment and Plan Assessment & Plan Stage 1B ER/PR positive right breast invasive ductal  carcinoma Undergoing adjuvant chemotherapy post-lumpectomy. Completed four cycles of doxorubicin  and cyclophosphamide . Present for cycle six of twelve weekly Paclitaxel . Reports fatigue and dysgeusia, denies neuropathy or gastrointestinal symptoms. Labs normal. - Graig will proceed with chemotherapy today with # 6 weekly Paclitaxel . - Reviewed labs with patient in detail and within parameters to proceed without dose reduction or modification.   - Assessed for chemotherapy side effects: neuropathy, gastrointestinal symptoms, dysgeusia. - Reassured regarding normal cardiac evaluation and symptoms. - Scheduled future chemotherapy treatments.   All questions were answered. The patient knows to call the clinic with any problems, questions or concerns. We can certainly see the patient much sooner if necessary.  Total encounter time:20 minutes*in face-to-face visit time, chart review, lab review, care coordination, order entry, and documentation of the encounter time.    Morna Kendall, NP 05/02/24 11:53 AM Medical Oncology and Hematology [facility] [address] [place], [place] [address] Tel. [phone]    Fax. [phone]  *Total Encounter Time as defined by the Centers for Medicare and Medicaid Services includes, in addition to the face-to-face time of a patient visit (documented in the note above) non-face-to-face time: obtaining and reviewing outside history, ordering and reviewing medications, tests or procedures, care coordination (communications with other health care professionals or caregivers) and documentation in the medical record.

## 2024-05-03 ENCOUNTER — Other Ambulatory Visit: Payer: Self-pay

## 2024-05-04 ENCOUNTER — Encounter: Payer: Self-pay | Admitting: Hematology and Oncology

## 2024-05-04 ENCOUNTER — Other Ambulatory Visit: Payer: Self-pay

## 2024-05-04 DIAGNOSIS — C50411 Malignant neoplasm of upper-outer quadrant of right female breast: Secondary | ICD-10-CM

## 2024-05-04 NOTE — Research (Signed)
 [id number], ICE COMPRESS: RANDOMIZED TRIAL OF LIMB CRYOCOMPRESSION VERSUS CONTINUOUS COMPRESSION VERSUS LOW CYCLIC COMPRESSION FOR THE PREVENTION  OF TAXANE-INDUCED PERIPHERAL NEUROPATHY  Research labs ordered.   Delon Pinal BSN RN Clinical Research Nurse [facility] Direct Dial: [phone] 05/04/2024  11:22 AM

## 2024-05-06 ENCOUNTER — Other Ambulatory Visit: Payer: Self-pay

## 2024-05-06 ENCOUNTER — Encounter: Payer: Self-pay | Admitting: Hematology and Oncology

## 2024-05-08 ENCOUNTER — Encounter: Payer: Self-pay | Admitting: *Deleted

## 2024-05-09 ENCOUNTER — Inpatient Hospital Stay

## 2024-05-09 ENCOUNTER — Inpatient Hospital Stay: Admitting: Hematology and Oncology

## 2024-05-09 VITALS — BP 126/76 | HR 63 | Temp 98.0°F | Resp 18 | Ht 63.0 in | Wt 122.8 lb

## 2024-05-09 DIAGNOSIS — Z170 Estrogen receptor positive status [ER+]: Secondary | ICD-10-CM

## 2024-05-09 DIAGNOSIS — C50411 Malignant neoplasm of upper-outer quadrant of right female breast: Secondary | ICD-10-CM | POA: Diagnosis not present

## 2024-05-09 LAB — CBC WITH DIFFERENTIAL (CANCER CENTER ONLY)
Abs Immature Granulocytes: 0.03 10*3/uL (ref 0.00–0.07)
Basophils Absolute: 0 10*3/uL (ref 0.0–0.1)
Basophils Relative: 1 %
Eosinophils Absolute: 0.1 10*3/uL (ref 0.0–0.5)
Eosinophils Relative: 4 %
HCT: 30.3 % — ABNORMAL LOW (ref 36.0–46.0)
Hemoglobin: 10.5 g/dL — ABNORMAL LOW (ref 12.0–15.0)
Immature Granulocytes: 1 %
Lymphocytes Relative: 24 %
Lymphs Abs: 0.8 10*3/uL (ref 0.7–4.0)
MCH: 32 pg (ref 26.0–34.0)
MCHC: 34.7 g/dL (ref 30.0–36.0)
MCV: 92.4 fL (ref 80.0–100.0)
Monocytes Absolute: 0.4 10*3/uL (ref 0.1–1.0)
Monocytes Relative: 11 %
Neutro Abs: 1.8 10*3/uL (ref 1.7–7.7)
Neutrophils Relative %: 59 %
Platelet Count: 190 10*3/uL (ref 150–400)
RBC: 3.28 MIL/uL — ABNORMAL LOW (ref 3.87–5.11)
RDW: 13.2 % (ref 11.5–15.5)
WBC Count: 3.2 10*3/uL — ABNORMAL LOW (ref 4.0–10.5)
nRBC: 0 % (ref 0.0–0.2)

## 2024-05-09 LAB — CMP (CANCER CENTER ONLY)
ALT: 9 U/L (ref 0–44)
AST: 21 U/L (ref 15–41)
Albumin: 4.4 g/dL (ref 3.5–5.0)
Alkaline Phosphatase: 64 U/L (ref 38–126)
Anion gap: 10 (ref 5–15)
BUN: 12 mg/dL (ref 8–23)
CO2: 25 mmol/L (ref 22–32)
Calcium: 9 mg/dL (ref 8.9–10.3)
Chloride: 105 mmol/L (ref 98–111)
Creatinine: 0.49 mg/dL (ref 0.44–1.00)
GFR, Estimated: >60 mL/min
Glucose, Bld: 104 mg/dL — ABNORMAL HIGH (ref 70–99)
Potassium: 4.1 mmol/L (ref 3.5–5.1)
Sodium: 139 mmol/L (ref 135–145)
Total Bilirubin: 0.4 mg/dL (ref 0.0–1.2)
Total Protein: 6.4 g/dL — ABNORMAL LOW (ref 6.5–8.1)

## 2024-05-09 MED ORDER — SODIUM CHLORIDE 0.9 % IV SOLN
80.0000 mg/m2 | Freq: Once | INTRAVENOUS | Status: AC
  Administered 2024-05-09: 126 mg via INTRAVENOUS
  Filled 2024-05-09: qty 21

## 2024-05-09 MED ORDER — FAMOTIDINE IN NACL 20-0.9 MG/50ML-% IV SOLN
20.0000 mg | Freq: Once | INTRAVENOUS | Status: AC
  Administered 2024-05-09: 20 mg via INTRAVENOUS
  Filled 2024-05-09: qty 50

## 2024-05-09 MED ORDER — DIPHENHYDRAMINE HCL 50 MG/ML IJ SOLN
50.0000 mg | Freq: Once | INTRAMUSCULAR | Status: AC
  Administered 2024-05-09: 50 mg via INTRAVENOUS
  Filled 2024-05-09: qty 1

## 2024-05-09 MED ORDER — SODIUM CHLORIDE 0.9 % IV SOLN
10.0000 mg | Freq: Once | INTRAVENOUS | Status: AC
  Administered 2024-05-09: 10 mg via INTRAVENOUS
  Filled 2024-05-09: qty 10

## 2024-05-09 MED ORDER — SODIUM CHLORIDE 0.9 % IV SOLN: INTRAVENOUS | Status: DC

## 2024-05-09 NOTE — Patient Instructions (Signed)

## 2024-05-09 NOTE — Assessment & Plan Note (Signed)
 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, anterior margin excision: Focus of IDC grade 2 with DCIS final margins negative  Oncotype DX: Recurrence score 31 (18% risk of recurrence)   01/05/2024: CT CAP and bone scan: Simple cysts in the liver largest 1.4 cm, several sub-5 mm ill-defined foci too small to characterize, few prominent right axillary lymph nodes, seroma right axilla 4.2 cm, ER 100%, PR 10%, HER2 1+ negative, Ki67 20%   Treatment plan: Adjuvant chemotherapy with dose dense Adriamycin  and Cytoxan  x 4 followed by Taxol  x 12 Adjuvant radiation therapy Antiestrogen therapy ----------------------------------------------------------------------------------------------------------------------------------------- Current treatment: Completed 4 cycles of dose dense Adriamycin  and Cytoxan , today cycle 7 Taxol  Chemo toxicities: Chemo-induced nausea and vomiting: She continued to have nausea and vomiting in spite of lowering the dosage.  She will try taking alprazolam during the daytime to reduce the nausea for those 2 days. Fatigue Thrombocytopenia: Recovered Mild anemia secondary to chemotherapy: Being watched and monitored   Return to clinic weekly for Taxol  treatments

## 2024-05-09 NOTE — Progress Notes (Signed)
 "  Patient Care Team: [name], MD as PCP - General (Family Medicine) [name], MD as Consulting Physician (Endocrinology) [name], MD (Inactive) as Consulting Physician (Plastic Surgery) [name], MD as Consulting Physician (General Surgery) [name], RN as Oncology Nurse Navigator [name], Devere HERO, RN as Oncology Nurse Navigator Odean Potts, MD as Consulting Physician (Hematology and Oncology) [name], MD as Consulting Physician (General Surgery) [name], MD as Consulting Physician (Radiation Oncology)  DIAGNOSIS:  Encounter Diagnosis  Name Primary?   Malignant neoplasm of upper-outer quadrant of right breast in female, estrogen receptor positive (HCC) Yes    SUMMARY OF ONCOLOGIC HISTORY: Oncology History  Malignant neoplasm of upper-outer quadrant of right breast in female, estrogen receptor positive (HCC)  12/09/2023 Initial Diagnosis   Palpable mass in the right breast measured 1.6 cm by mammogram and ultrasound.  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, margins negative, 1/3 lymph node with micrometastatic disease, anterior margin excision: Focus of IDC grade 2 with DCIS final margins negative   12/29/2023 Cancer Staging   Staging form: Breast, AJCC 8th Edition - Pathologic stage from 12/29/2023: Stage IB (pT2, pN6mi(sn), cM0, G2, ER+, PR+, HER2-) - Signed by [name], PA-C on 12/29/2023 Stage prefix: Initial diagnosis Method of lymph node assessment: Sentinel lymph node biopsy Histologic grading system: 3 grade system   01/25/2024 Genetic Testing   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 The report date is 01/25/2024.  The CancerNext-Expanded gene panel offered by [facility] and includes sequencing, rearrangement, and RNA  analysis for the following 77 genes: AIP, ALK, APC, ATM, AXIN2, BAP1, BARD1, BMPR1A, BRCA1, BRCA2, BRIP1, CDC73, CDH1, CDK4, CDKN1B, CDKN2A, CEBPA, CHEK2, CTNNA1, DDX41, DICER1, ETV6, FH, FLCN, GATA2, LZTR1, MAX, MBD4, MEN1, MET, MLH1, MSH2, MSH3, MSH6, MUTYH, NF1, NF2, NTHL1, PALB2, PHOX2B, PMS2, POT1, PRKAR1A, PTCH1, PTEN, RAD51C, RAD51D, RB1, RET, RPS20, RUNX1, SDHA, SDHAF2, SDHB, SDHC, SDHD, SMAD4, SMARCA4, SMARCB1, SMARCE1, STK11, SUFU, TMEM127, TP53, TSC1, TSC2, VHL, and WT1 (sequencing and deletion/duplication); EGFR, HOXB13, KIT, MITF, PDGFRA, POLD1, and POLE (sequencing only); EPCAM and GREM1 (deletion/duplication only).    01/28/2024 -  Chemotherapy   Patient is on Treatment Plan : BREAST DOSE DENSE AC q14d / PACLitaxel  q7d       CHIEF COMPLIANT: Cycle 7 Taxol   HISTORY OF PRESENT ILLNESS:   History of Present Illness Melinda Forbes is a 59 year old female with stage IB ER+/PR+, HER2- right breast invasive ductal carcinoma undergoing adjuvant weekly paclitaxel  chemotherapy who presents for routine follow-up and ongoing treatment.  She has completed seven weekly paclitaxel  cycles with five remaining. Labs show gradual neutrophil decline to 1.8. Hemoglobin is stable in the 10s, 10.5 today, and platelets are normal. She denies abnormal bleeding, bruising, or new palpable masses.  She uses ice therapy to hands and feet during infusions for neuropathy prevention and has not missed sessions. She denies numbness, tingling, or other neuropathy symptoms. She denies new symptoms related to recent travel or activities.  She maintains adequate hydration and nutrition, with some recent dietary lapse during travel to [place]. She has never smoked and remains adherent to her chemotherapy schedule.       ALLERGIES:  is allergic to codeine, morphine  sulfate, and other.  MEDICATIONS:  Current Outpatient Medications  Medication Sig Dispense Refill   ALPRAZolam (XANAX)  0.5 MG tablet Take  0.5 mg by mouth at bedtime as needed for anxiety.     busPIRone HCl (BUSPAR PO) Take by mouth.     Calcium Carbonate-Vit D-Min (CALCIUM 1200 PO) Take 1 tablet by mouth at bedtime.      Cranberry 125 MG TABS Take by mouth.     docusate sodium (COLACE) 100 MG capsule Take 100 mg by mouth 2 (two) times daily.     esomeprazole  (NEXIUM ) 40 MG capsule Take 1 capsule (40 mg total) by mouth daily at 12 noon. 90 capsule 1   famotidine  (PEPCID ) 20 MG tablet Take 20 mg by mouth at bedtime.     fluticasone  (FLONASE ) 50 MCG/ACT nasal spray Place 2 sprays into both nostrils daily. 16 g 10   levothyroxine  (SYNTHROID ) 88 MCG tablet Take 1 tablet (88 mcg total) by mouth daily before breakfast.     lidocaine -prilocaine  (EMLA ) cream Apply to affected area once 30 g 3   Multiple Vitamin (MULTIVITAMIN) tablet Take 1 tablet by mouth daily.     Naproxen Sod-diphenhydrAMINE  (ALEVE PM PO) Take 80 mg by mouth daily.     ondansetron  (ZOFRAN ) 8 MG tablet TAKE 1 TAB BY MOUTH EVERY 8 HRS AS NEEDED FOR NAUSEA/VOMITING. START THIRD DAY AFTER DOXORUBICIN /CYCLOPHOSPHAMIDE  CHEMOTHERAPY. (Patient not taking: Reported on 04/11/2024) 30 tablet 1   prochlorperazine  (COMPAZINE ) 10 MG tablet Take 1 tablet (10 mg total) by mouth every 6 (six) hours as needed for nausea or vomiting. 30 tablet 1   promethazine  (PHENERGAN ) 12.5 MG tablet Take 1 tablet (12.5 mg total) by mouth every 6 (six) hours as needed for nausea or vomiting. 30 tablet 1   Psyllium Fiber 0.52 g CAPS Take 0.52 g by mouth daily.     RESTASIS 0.05 % ophthalmic emulsion Place into both eyes.     sucralfate  (CARAFATE ) 1 g tablet Take 1 tablet (1 g total) by mouth 4 (four) times daily -  with meals and at bedtime. 120 tablet 1   Zinc 50 MG TABS Take by mouth.     No current facility-administered medications for this visit.    PHYSICAL EXAMINATION: ECOG PERFORMANCE STATUS: 1 - Symptomatic but completely ambulatory  Vitals:   05/09/24 1052  BP: 126/76  Pulse: 63  Resp:  18  Temp: 98 F (36.7 C)  SpO2: 100%   Filed Weights   05/09/24 1052  Weight: 122 lb 12.8 oz (55.7 kg)      LABORATORY DATA:  I have reviewed the data as listed    Latest Ref Rng & Units 05/09/2024   10:21 AM 05/02/2024   11:21 AM 04/25/2024    9:54 AM  CMP  Glucose 70 - 99 mg/dL 895  887  894   BUN 8 - 23 mg/dL 12  10  11    Creatinine 0.44 - 1.00 mg/dL 9.50  9.49  9.47   Sodium 135 - 145 mmol/L 139  140  138   Potassium 3.5 - 5.1 mmol/L 4.1  3.7  4.1   Chloride 98 - 111 mmol/L 105  105  103   CO2 22 - 32 mmol/L 25  24  25    Calcium 8.9 - 10.3 mg/dL 9.0  9.2  9.3   Total Protein 6.5 - 8.1 g/dL 6.4  6.5  6.5   Total Bilirubin 0.0 - 1.2 mg/dL 0.4  0.4  0.4   Alkaline Phos 38 - 126 U/L 64  66  67   AST 15 - 41 U/L 21  21  22   ALT 0 - 44 U/L 9  9  11      Lab Results  Component Value Date   WBC 3.2 (L) 05/09/2024   HGB 10.5 (L) 05/09/2024   HCT 30.3 (L) 05/09/2024   MCV 92.4 05/09/2024   PLT 190 05/09/2024   NEUTROABS 1.8 05/09/2024    ASSESSMENT & PLAN:  Malignant neoplasm of upper-outer quadrant of right breast in female, estrogen receptor positive (HCC) 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, anterior margin excision: Focus of IDC grade 2 with DCIS final margins negative  Oncotype DX: Recurrence score 31 (18% risk of recurrence)   01/05/2024: CT CAP and bone scan: Simple cysts in the liver largest 1.4 cm, several sub-5 mm ill-defined foci too small to characterize, few prominent right axillary lymph nodes, seroma right axilla 4.2 cm, ER 100%, PR 10%, HER2 1+ negative, Ki67 20%   Treatment plan: Adjuvant chemotherapy with dose dense Adriamycin  and Cytoxan  x 4 followed by Taxol  x 12 Adjuvant radiation therapy Antiestrogen  therapy ----------------------------------------------------------------------------------------------------------------------------------------- Current treatment: Completed 4 cycles of dose dense Adriamycin  and Cytoxan , today cycle 7 Taxol  Chemo toxicities:  Fatigue Thrombocytopenia: Recovered Mild anemia secondary to chemotherapy: Being watched and monitored   Monitoring closely for neuropathy.  Patient is using ice and for prevention. Return to clinic weekly for Taxol  treatments       No orders of the defined types were placed in this encounter.  The patient has a good understanding of the overall plan. she agrees with it. she will call with any problems that may develop before the next visit here.  I personally spent a total of 30 minutes in the care of the patient today including preparing to see the patient, getting/reviewing separately obtained history, performing a medically appropriate exam/evaluation, counseling and educating, placing orders, referring and communicating with other health care professionals, documenting clinical information in the EHR, independently interpreting results, communicating results, and coordinating care.   Viinay K Jane Birkel, MD 05/09/24    "

## 2024-05-10 ENCOUNTER — Encounter: Payer: Self-pay | Admitting: Hematology and Oncology

## 2024-05-12 ENCOUNTER — Telehealth: Payer: Self-pay

## 2024-05-12 NOTE — Telephone Encounter (Signed)
 D7794, ICE COMPRESS: RANDOMIZED TRIAL OF LIMB CRYOCOMPRESSION VERSUS CONTINUOUS COMPRESSION VERSUS LOW CYCLIC COMPRESSION FOR THE PREVENTION  OF TAXANE-INDUCED PERIPHERAL NEUROPATHY  Research Rn called pt to inform her that RN will see pt on 05-16-24 for week 7 pro's, labs, and assessments. Pt understood this plan and had no questions at this time. Pt has contact information for research RN if needed for further concerns.   Delon Pinal BSN RN Clinical Research Nurse [facility] Direct Dial: [phone] 05/12/2024  10:05 AM

## 2024-05-16 ENCOUNTER — Inpatient Hospital Stay: Payer: Self-pay | Attending: Hematology and Oncology | Admitting: Hematology and Oncology

## 2024-05-16 ENCOUNTER — Inpatient Hospital Stay

## 2024-05-16 ENCOUNTER — Encounter: Payer: Self-pay | Admitting: *Deleted

## 2024-05-16 VITALS — BP 127/76 | HR 64 | Temp 97.2°F | Resp 18 | Wt 121.4 lb

## 2024-05-16 VITALS — BP 124/85 | HR 80 | Resp 18

## 2024-05-16 DIAGNOSIS — C50411 Malignant neoplasm of upper-outer quadrant of right female breast: Secondary | ICD-10-CM

## 2024-05-16 LAB — CMP (CANCER CENTER ONLY)
ALT: 8 U/L (ref 0–44)
AST: 21 U/L (ref 15–41)
Albumin: 4.3 g/dL (ref 3.5–5.0)
Alkaline Phosphatase: 64 U/L (ref 38–126)
Anion gap: 10 (ref 5–15)
BUN: 14 mg/dL (ref 8–23)
CO2: 25 mmol/L (ref 22–32)
Calcium: 9.1 mg/dL (ref 8.9–10.3)
Chloride: 103 mmol/L (ref 98–111)
Creatinine: 0.45 mg/dL (ref 0.44–1.00)
GFR, Estimated: >60 mL/min
Glucose, Bld: 105 mg/dL — ABNORMAL HIGH (ref 70–99)
Potassium: 3.8 mmol/L (ref 3.5–5.1)
Sodium: 138 mmol/L (ref 135–145)
Total Bilirubin: 0.5 mg/dL (ref 0.0–1.2)
Total Protein: 6.5 g/dL (ref 6.5–8.1)

## 2024-05-16 LAB — CBC WITH DIFFERENTIAL (CANCER CENTER ONLY)
Abs Immature Granulocytes: 0.02 10*3/uL (ref 0.00–0.07)
Basophils Absolute: 0 10*3/uL (ref 0.0–0.1)
Basophils Relative: 1 %
Eosinophils Absolute: 0.2 10*3/uL (ref 0.0–0.5)
Eosinophils Relative: 5 %
HCT: 31.4 % — ABNORMAL LOW (ref 36.0–46.0)
Hemoglobin: 10.8 g/dL — ABNORMAL LOW (ref 12.0–15.0)
Immature Granulocytes: 1 %
Lymphocytes Relative: 26 %
Lymphs Abs: 0.8 10*3/uL (ref 0.7–4.0)
MCH: 31.2 pg (ref 26.0–34.0)
MCHC: 34.4 g/dL (ref 30.0–36.0)
MCV: 90.8 fL (ref 80.0–100.0)
Monocytes Absolute: 0.4 10*3/uL (ref 0.1–1.0)
Monocytes Relative: 13 %
Neutro Abs: 1.7 10*3/uL (ref 1.7–7.7)
Neutrophils Relative %: 54 %
Platelet Count: 243 10*3/uL (ref 150–400)
RBC: 3.46 MIL/uL — ABNORMAL LOW (ref 3.87–5.11)
RDW: 13.3 % (ref 11.5–15.5)
WBC Count: 3.1 10*3/uL — ABNORMAL LOW (ref 4.0–10.5)
nRBC: 0 % (ref 0.0–0.2)

## 2024-05-16 LAB — RESEARCH LABS

## 2024-05-16 MED ORDER — FAMOTIDINE IN NACL 20-0.9 MG/50ML-% IV SOLN
20.0000 mg | Freq: Once | INTRAVENOUS | Status: AC
  Administered 2024-05-16: 20 mg via INTRAVENOUS
  Filled 2024-05-16: qty 50

## 2024-05-16 MED ORDER — DIPHENHYDRAMINE HCL 50 MG/ML IJ SOLN
50.0000 mg | Freq: Once | INTRAMUSCULAR | Status: AC
  Administered 2024-05-16: 50 mg via INTRAVENOUS
  Filled 2024-05-16: qty 1

## 2024-05-16 MED ORDER — SODIUM CHLORIDE 0.9 % IV SOLN
80.0000 mg/m2 | Freq: Once | INTRAVENOUS | Status: AC
  Administered 2024-05-16: 126 mg via INTRAVENOUS
  Filled 2024-05-16: qty 21

## 2024-05-16 MED ORDER — SODIUM CHLORIDE 0.9 % IV SOLN
10.0000 mg | Freq: Once | INTRAVENOUS | Status: AC
  Administered 2024-05-16: 10 mg via INTRAVENOUS
  Filled 2024-05-16: qty 10

## 2024-05-16 MED ORDER — SODIUM CHLORIDE 0.9 % IV SOLN: INTRAVENOUS | Status: DC

## 2024-05-16 NOTE — Research (Addendum)
 D7794, ICE COMPRESS: RANDOMIZED TRIAL OF LIMB CRYOCOMPRESSION VERSUS CONTINUOUS COMPRESSION VERSUS LOW CYCLIC COMPRESSION FOR THE PREVENTION OF TAXANE-INDUCED PERIPHERAL NEUROPATHY    Week 6 Patient arrives today accompanied by her husband for her scheduled appointments.     PROs: Provided PROs to patient at registration prior to her other appointments. Collected and checked for completeness and accuracy.  Labs: Research labs collected per protocol and patient consent.    Neuropathy Assessment: Neuropathy assessment, including neuropen, tuning fork and Get up & Go test, completed by this research RN per protocol.   Supplemental Agents: Patient denies taking Duloxetine for any reason. Pt denies taking any treatments prescribed for symptom management of peripheral neuropathy. Pt denies taking vitamins or supplements for symptom management of peripheral neuropathy. Pt denies receiving any complimentary or alternative therapies for symptom management of peripheral neuropathy.   Adverse Events: Solicited AEs reviewed with patient. She did not have any device related AEs. She also denies any sensory or motor neuropathy symptoms.     Adverse Event CTCAE Grade Onset date Resolved date Relationship to Study Intervention Relationship to Taxane Action Taken Comments  Skin atrophy (solicited) 0              Skin hyperpigmentation (solicited) 0              Skin hypopigmentation (solicited) 0              Skin induration (solicited) 0              Skin ulceration (solicited) 0              Rash maculopapular (solicited) 0              Nail changes (solicited) 0                Cold intolerance (solicited) (general disorders and administration site conditions- other) 0              Frostbite (solicited) (skin and subcutaneous tissue disorders- other) 0                  PLAN:  The patient was thanked for their ongoing participation in this study. The next study assessment is due in 12 weeks.  Research nurse, Melinda Forbes, will reach out to patient to schedule that appointment ahead of time. Patient has contact information for research nurse and was encouraged to call if any questions before next visit. Patient verbalized understanding.      Melinda Forbes, BSN, RN, [employer] Research Nurse II [phone] 05/16/2024

## 2024-05-16 NOTE — Progress Notes (Signed)
 "  Patient Care Team: [name], MD as PCP - General (Family Medicine) [name], MD as Consulting Physician (Endocrinology) [name], MD (Inactive) as Consulting Physician (Plastic Surgery) [name], MD as Consulting Physician (General Surgery) [name], RN as Oncology Nurse Navigator [name], Devere HERO, RN as Oncology Nurse Navigator Odean Potts, MD as Consulting Physician (Hematology and Oncology) [name], MD as Consulting Physician (General Surgery) [name], MD as Consulting Physician (Radiation Oncology)  DIAGNOSIS:  Encounter Diagnosis  Name Primary?   Malignant neoplasm of upper-outer quadrant of right breast in female, estrogen receptor positive (HCC) Yes    SUMMARY OF ONCOLOGIC HISTORY: Oncology History  Malignant neoplasm of upper-outer quadrant of right breast in female, estrogen receptor positive (HCC)  12/09/2023 Initial Diagnosis   Palpable mass in the right breast measured 1.6 cm by mammogram and ultrasound.  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, margins negative, 1/3 lymph node with micrometastatic disease, anterior margin excision: Focus of IDC grade 2 with DCIS final margins negative   12/29/2023 Cancer Staging   Staging form: Breast, AJCC 8th Edition - Pathologic stage from 12/29/2023: Stage IB (pT2, pN3mi(sn), cM0, G2, ER+, PR+, HER2-) - Signed by [name], PA-C on 12/29/2023 Stage prefix: Initial diagnosis Method of lymph node assessment: Sentinel lymph node biopsy Histologic grading system: 3 grade system   01/25/2024 Genetic Testing   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 The report date is 01/25/2024.  The CancerNext-Expanded gene panel offered by [facility] and includes sequencing, rearrangement, and RNA  analysis for the following 77 genes: AIP, ALK, APC, ATM, AXIN2, BAP1, BARD1, BMPR1A, BRCA1, BRCA2, BRIP1, CDC73, CDH1, CDK4, CDKN1B, CDKN2A, CEBPA, CHEK2, CTNNA1, DDX41, DICER1, ETV6, FH, FLCN, GATA2, LZTR1, MAX, MBD4, MEN1, MET, MLH1, MSH2, MSH3, MSH6, MUTYH, NF1, NF2, NTHL1, PALB2, PHOX2B, PMS2, POT1, PRKAR1A, PTCH1, PTEN, RAD51C, RAD51D, RB1, RET, RPS20, RUNX1, SDHA, SDHAF2, SDHB, SDHC, SDHD, SMAD4, SMARCA4, SMARCB1, SMARCE1, STK11, SUFU, TMEM127, TP53, TSC1, TSC2, VHL, and WT1 (sequencing and deletion/duplication); EGFR, HOXB13, KIT, MITF, PDGFRA, POLD1, and POLE (sequencing only); EPCAM and GREM1 (deletion/duplication only).    01/28/2024 -  Chemotherapy   Patient is on Treatment Plan : BREAST DOSE DENSE AC q14d / PACLitaxel  q7d       CHIEF COMPLIANT: Cycle 8 Taxol   HISTORY OF PRESENT ILLNESS:  History of Present Illness Melinda Forbes is a 59 year old woman with stage IB ER+/PR+/HER2- invasive ductal carcinoma of the right breast, status post lumpectomy and adjuvant dose-dense AC, presenting for ongoing chemotherapy management.  She is on adjuvant weekly paclitaxel , currently cycle 7 of 12, with no dose reductions or changes needed. She denies worsening peripheral neuropathy or other new chemotherapy toxicities.  She has persistent fatigue, which she partially attributes to thyroid  disease, but she remains active and able to perform strenuous tasks. She feels anxious about ongoing treatment and is eager to complete chemotherapy and start radiation, asking about the timing between therapies.  She has frequent nosebleeds with clot formation, worse at home and improved with mask use. Topical therapy and a cold mist humidifier provide partial relief.  Her recent hemoglobin has trended up. She denies symptoms of worsening cytopenias and has no new musculoskeletal issues aside from mild back pain after recent physical activity.      ALLERGIES:  is allergic  to codeine, morphine   sulfate, and other.  MEDICATIONS:  Current Outpatient Medications  Medication Sig Dispense Refill   ALPRAZolam (XANAX) 0.5 MG tablet Take 0.5 mg by mouth at bedtime as needed for anxiety.     busPIRone (BUSPAR) 10 MG tablet Take 10 mg by mouth 2 (two) times daily.     Calcium Carbonate-Vit D-Min (CALCIUM 1200 PO) Take 1 tablet by mouth at bedtime.      Cranberry 125 MG TABS Take by mouth.     docusate sodium (COLACE) 100 MG capsule Take 100 mg by mouth 2 (two) times daily.     esomeprazole  (NEXIUM ) 40 MG capsule Take 1 capsule (40 mg total) by mouth daily at 12 noon. 90 capsule 1   famotidine  (PEPCID ) 20 MG tablet Take 20 mg by mouth at bedtime.     levothyroxine  (SYNTHROID ) 88 MCG tablet Take 1 tablet (88 mcg total) by mouth daily before breakfast.     lidocaine -prilocaine  (EMLA ) cream Apply to affected area once 30 g 3   Multiple Vitamin (MULTIVITAMIN) tablet Take 1 tablet by mouth daily.     Naproxen Sod-diphenhydrAMINE  (ALEVE PM PO) Take 80 mg by mouth daily.     Psyllium Fiber 0.52 g CAPS Take 0.52 g by mouth daily.     RESTASIS 0.05 % ophthalmic emulsion Place into both eyes.     Zinc 50 MG TABS Take by mouth.     No current facility-administered medications for this visit.    PHYSICAL EXAMINATION: ECOG PERFORMANCE STATUS: 1 - Symptomatic but completely ambulatory  Vitals:   05/16/24 1147  BP: 127/76  Pulse: 64  Resp: 18  Temp: (!) 97.2 F (36.2 C)  SpO2: 100%   Filed Weights   05/16/24 1147  Weight: 121 lb 7 oz (55.1 kg)      LABORATORY DATA:  I have reviewed the data as listed    Latest Ref Rng & Units 05/16/2024   11:16 AM 05/09/2024   10:21 AM 05/02/2024   11:21 AM  CMP  Glucose 70 - 99 mg/dL 894  895  887   BUN 8 - 23 mg/dL 14  12  10    Creatinine 0.44 - 1.00 mg/dL 9.54  9.50  9.49   Sodium 135 - 145 mmol/L 138  139  140   Potassium 3.5 - 5.1 mmol/L 3.8  4.1  3.7   Chloride 98 - 111 mmol/L 103  105  105   CO2 22 - 32 mmol/L 25  25  24    Calcium 8.9 -  10.3 mg/dL 9.1  9.0  9.2   Total Protein 6.5 - 8.1 g/dL 6.5  6.4  6.5   Total Bilirubin 0.0 - 1.2 mg/dL 0.5  0.4  0.4   Alkaline Phos 38 - 126 U/L 64  64  66   AST 15 - 41 U/L 21  21  21    ALT 0 - 44 U/L 8  9  9      Lab Results  Component Value Date   WBC 3.1 (L) 05/16/2024   HGB 10.8 (L) 05/16/2024   HCT 31.4 (L) 05/16/2024   MCV 90.8 05/16/2024   PLT 243 05/16/2024   NEUTROABS 1.7 05/16/2024    ASSESSMENT & PLAN:  Malignant neoplasm of upper-outer quadrant of right breast in female, estrogen receptor positive (HCC) 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, anterior margin excision: Focus of IDC grade 2 with DCIS  final margins negative  Oncotype DX: Recurrence score 31 (18% risk of recurrence)   01/05/2024: CT CAP and bone scan: Simple cysts in the liver largest 1.4 cm, several sub-5 mm ill-defined foci too small to characterize, few prominent right axillary lymph nodes, seroma right axilla 4.2 cm, ER 100%, PR 10%, HER2 1+ negative, Ki67 20%   Treatment plan: Adjuvant chemotherapy with dose dense Adriamycin  and Cytoxan  x 4 followed by Taxol  x 12 Adjuvant radiation therapy Antiestrogen therapy ----------------------------------------------------------------------------------------------------------------------------------------- Current treatment: Completed 4 cycles of dose dense Adriamycin  and Cytoxan , today cycle 8 Taxol  Chemo toxicities:  Fatigue Thrombocytopenia: Recovered Mild anemia secondary to chemotherapy: Being watched and monitored   Monitoring closely for neuropathy.  Patient is using ice and for prevention. Return to clinic weekly for Taxol  treatments     No orders of the defined types were placed in this encounter.  The patient has a good understanding of the overall plan. she agrees with it. she will call with any problems that may develop before the next visit  here.  I personally spent a total of 30 minutes in the care of the patient today including preparing to see the patient, getting/reviewing separately obtained history, performing a medically appropriate exam/evaluation, counseling and educating, placing orders, referring and communicating with other health care professionals, documenting clinical information in the EHR, independently interpreting results, communicating results, and coordinating care.   Dr.Dayshaun Whobrey 05/16/24    "

## 2024-05-17 ENCOUNTER — Other Ambulatory Visit: Payer: Self-pay

## 2024-05-23 ENCOUNTER — Inpatient Hospital Stay: Payer: Self-pay | Admitting: Hematology and Oncology

## 2024-05-23 ENCOUNTER — Inpatient Hospital Stay: Payer: Self-pay

## 2024-05-30 ENCOUNTER — Inpatient Hospital Stay: Payer: Self-pay

## 2024-05-30 ENCOUNTER — Inpatient Hospital Stay: Payer: Self-pay | Admitting: Hematology and Oncology

## 2024-05-30 ENCOUNTER — Inpatient Hospital Stay

## 2024-06-06 ENCOUNTER — Inpatient Hospital Stay

## 2024-06-06 ENCOUNTER — Inpatient Hospital Stay: Attending: Hematology and Oncology

## 2024-06-06 ENCOUNTER — Inpatient Hospital Stay: Payer: Self-pay

## 2024-06-06 ENCOUNTER — Inpatient Hospital Stay: Payer: Self-pay | Admitting: Adult Health

## 2024-06-13 ENCOUNTER — Inpatient Hospital Stay: Admitting: Adult Health

## 2024-06-13 ENCOUNTER — Inpatient Hospital Stay

## 2024-06-13 ENCOUNTER — Inpatient Hospital Stay: Attending: Hematology and Oncology
